# Patient Record
Sex: Female | Born: 1950 | Race: White | Hispanic: No | Marital: Single | Smoking: Never smoker
Health system: Southern US, Community
[De-identification: ages and names within clinical notes are randomized; demographics above are authoritative.]

## PROBLEM LIST (undated history)

## (undated) DIAGNOSIS — M1710 Unilateral primary osteoarthritis, unspecified knee: Secondary | ICD-10-CM

## (undated) DIAGNOSIS — I10 Essential (primary) hypertension: Secondary | ICD-10-CM

## (undated) DIAGNOSIS — R519 Headache, unspecified: Secondary | ICD-10-CM

## (undated) DIAGNOSIS — E039 Hypothyroidism, unspecified: Secondary | ICD-10-CM

## (undated) DIAGNOSIS — C801 Malignant (primary) neoplasm, unspecified: Secondary | ICD-10-CM

## (undated) DIAGNOSIS — R51 Headache: Secondary | ICD-10-CM

## (undated) HISTORY — DX: Malignant (primary) neoplasm, unspecified: C80.1

---

## 2004-10-02 ENCOUNTER — Ambulatory Visit: Payer: Self-pay | Admitting: Family Medicine

## 2004-10-16 ENCOUNTER — Ambulatory Visit: Payer: Self-pay | Admitting: Family Medicine

## 2004-12-23 ENCOUNTER — Ambulatory Visit: Payer: Self-pay | Admitting: Family Medicine

## 2005-01-13 ENCOUNTER — Ambulatory Visit: Payer: Self-pay | Admitting: Internal Medicine

## 2005-01-13 ENCOUNTER — Ambulatory Visit (HOSPITAL_COMMUNITY): Admission: RE | Admit: 2005-01-13 | Discharge: 2005-01-13 | Payer: Self-pay | Admitting: Internal Medicine

## 2005-02-24 ENCOUNTER — Ambulatory Visit: Payer: Self-pay | Admitting: Family Medicine

## 2005-04-23 ENCOUNTER — Ambulatory Visit: Payer: Self-pay | Admitting: Family Medicine

## 2005-07-29 ENCOUNTER — Ambulatory Visit: Payer: Self-pay | Admitting: Family Medicine

## 2005-09-14 ENCOUNTER — Ambulatory Visit: Payer: Self-pay | Admitting: Family Medicine

## 2006-05-03 ENCOUNTER — Ambulatory Visit: Payer: Self-pay | Admitting: Family Medicine

## 2006-09-15 ENCOUNTER — Ambulatory Visit: Payer: Self-pay | Admitting: Family Medicine

## 2007-03-10 ENCOUNTER — Inpatient Hospital Stay (HOSPITAL_COMMUNITY): Admission: RE | Admit: 2007-03-10 | Discharge: 2007-03-12 | Payer: Self-pay | Admitting: Orthopedic Surgery

## 2007-03-10 IMAGING — CR DG KNEE 1-2V PORT*L*
2 series · 2 of 2 positions shown · non-contrast
Comparison: None

CLINICAL DATA: Arthroplasty

LEFT KNEE - 2 VIEW

[view not recorded (1 of 2)]
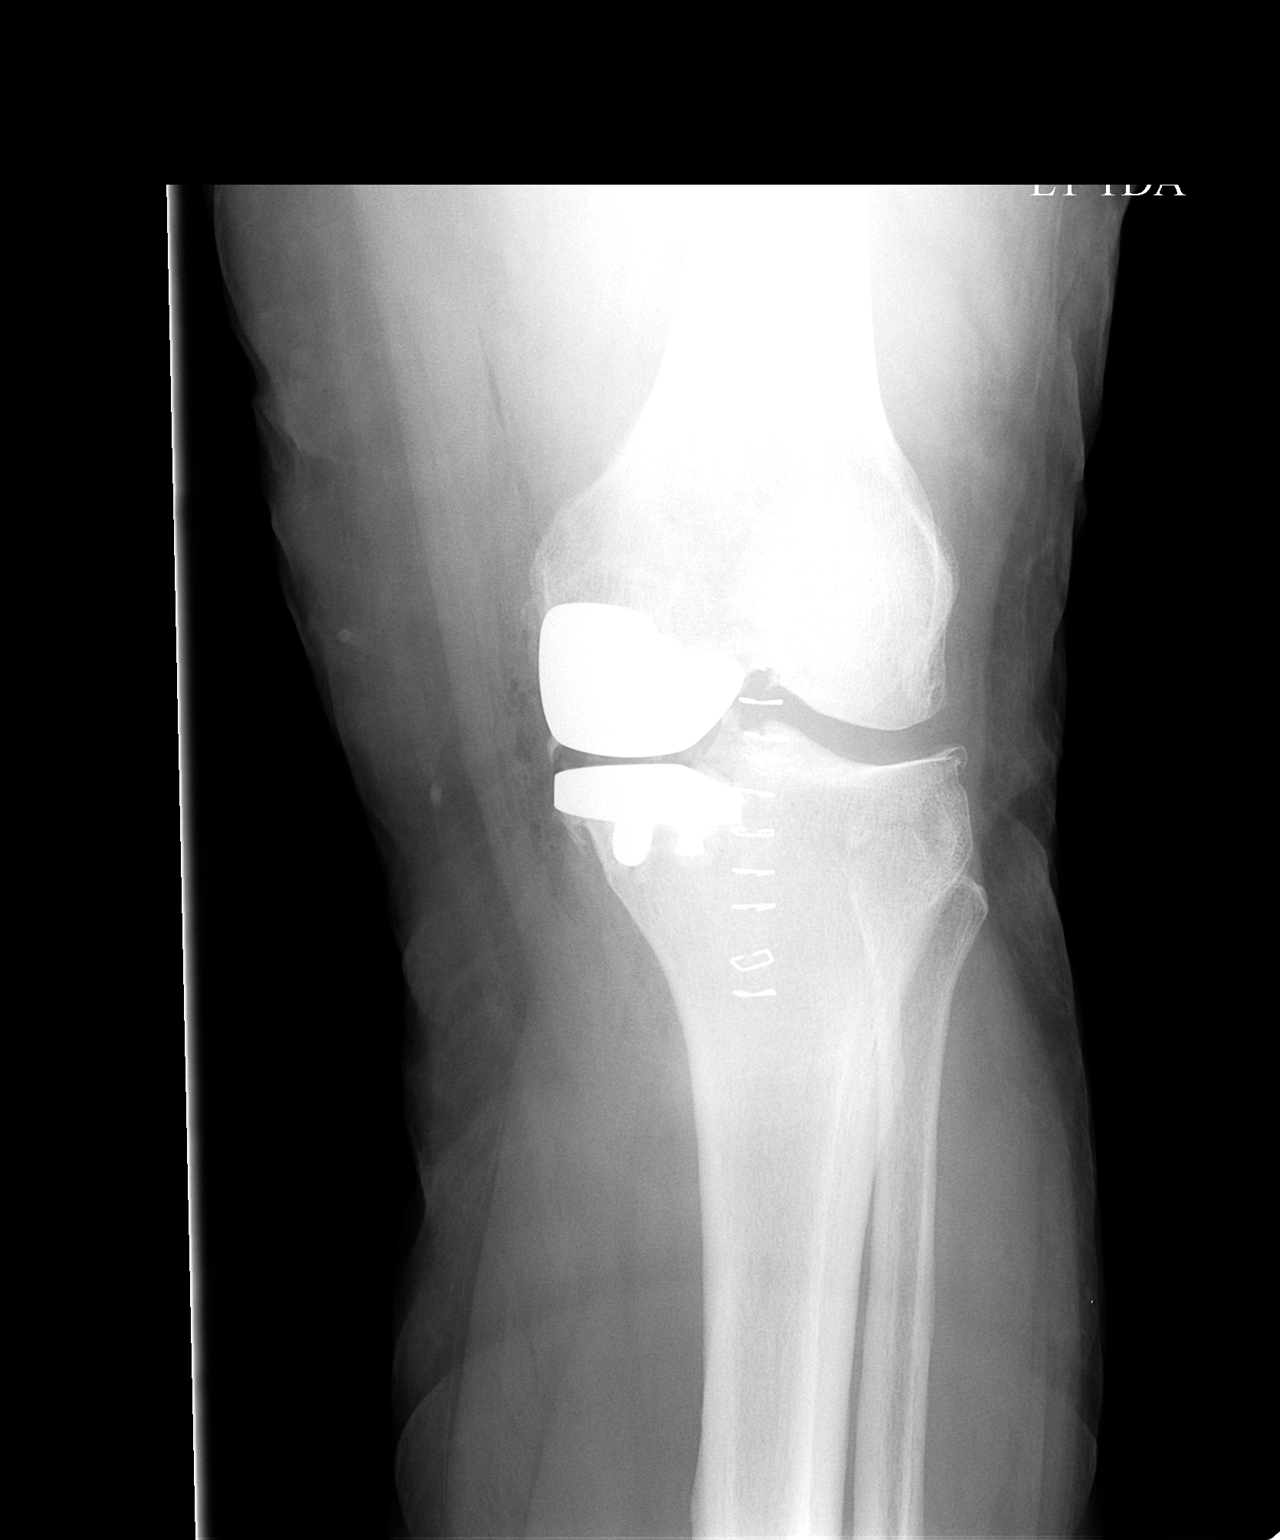

[view not recorded (2 of 2)]
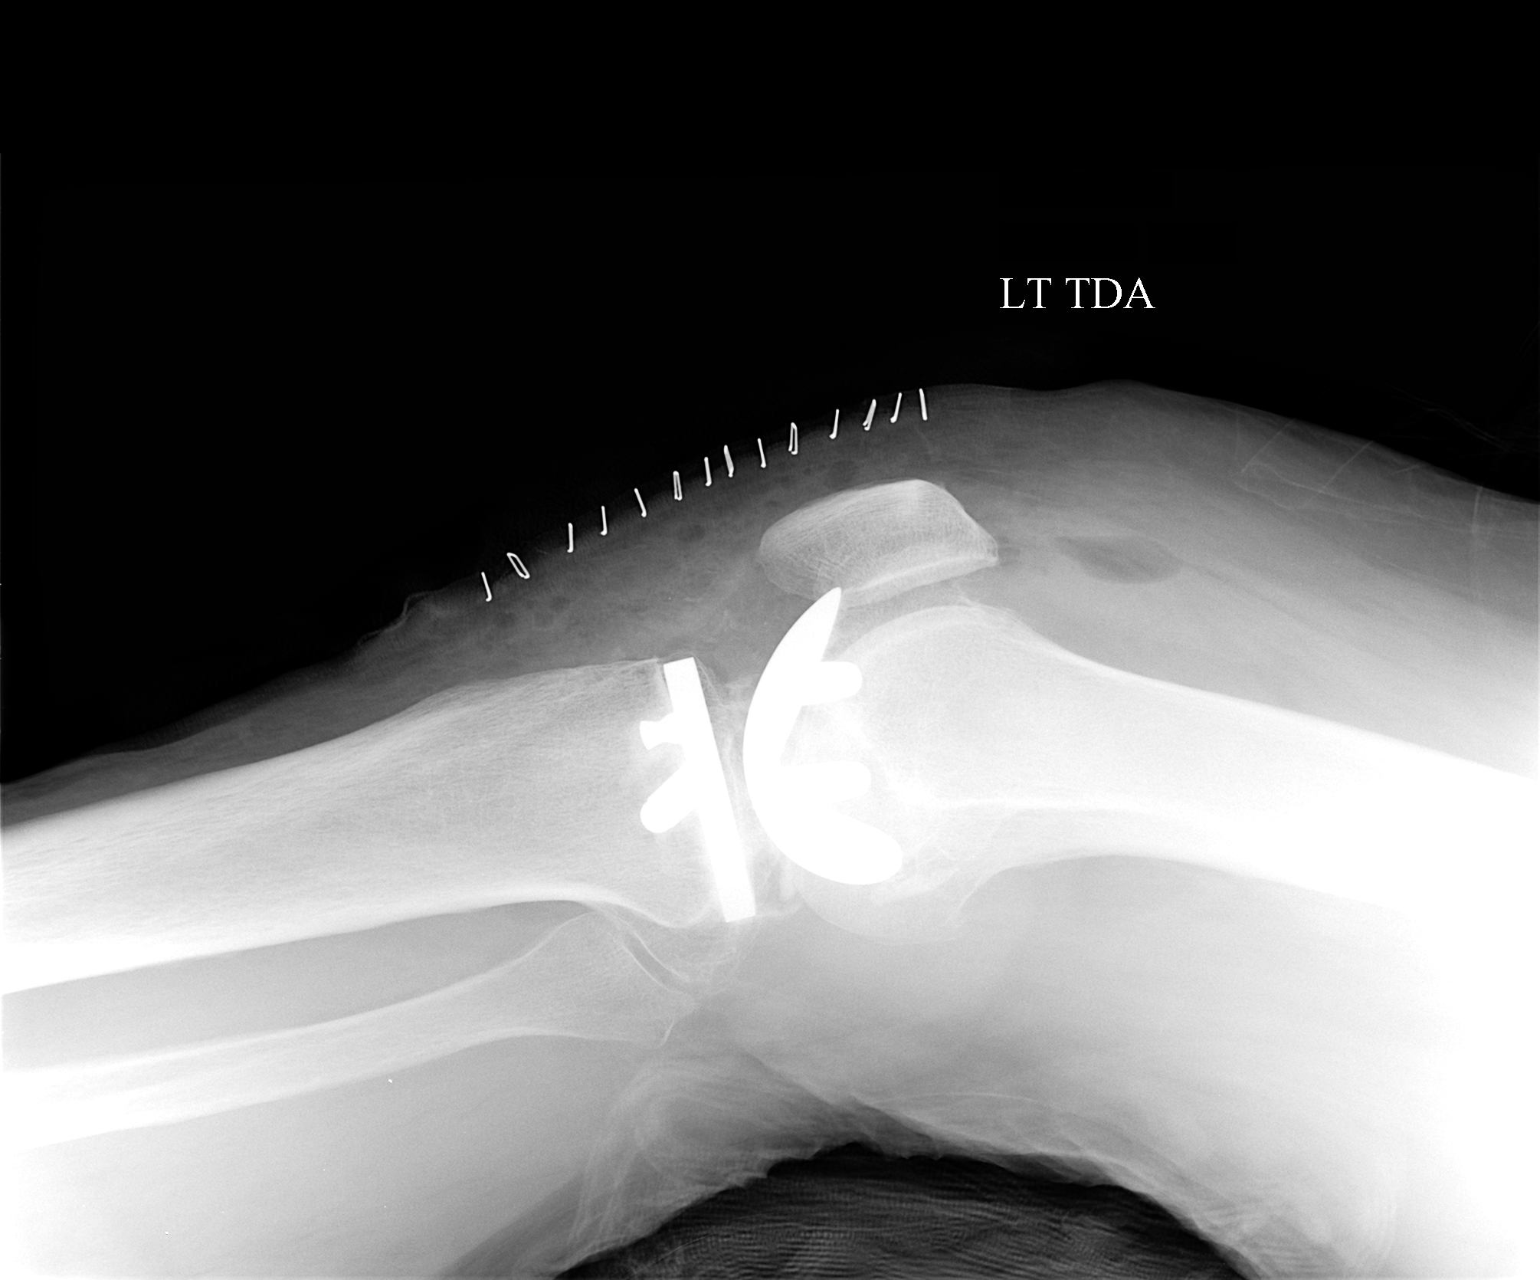

[2 of 2 positions shown; findings below may reference images not displayed]

FINDINGS: Left medial hemiarthroplasty. No hardware complication. Degenerative
changes of the lateral compartment. Expected postoperative air within the joint.

IMPRESSION

Expected appearance after hemiarthroplasty.

## 2007-03-29 ENCOUNTER — Ambulatory Visit
Admission: RE | Admit: 2007-03-29 | Discharge: 2007-03-30 | Payer: BLUE CROSS/BLUE SHIELD | Attending: Orthopedic Surgery | Admitting: Orthopedic Surgery

## 2007-08-13 ENCOUNTER — Emergency Department (HOSPITAL_COMMUNITY): Admission: EM | Admit: 2007-08-13 | Discharge: 2007-08-13 | Payer: Self-pay | Admitting: Emergency Medicine

## 2009-06-29 HISTORY — PX: KNEE ARTHROPLASTY: SHX992

## 2010-11-11 NOTE — Op Note (Signed)
NAME:  Laurie Mckenzie, Laurie Mckenzie NO.:  [id number]   MEDICAL RECORD NO.:  [id number]          PATIENT TYPE:  INP   LOCATION:  5016                         FACILITY:  [facility]   PHYSICIAN:  Loreta Ave, M.D. DATE OF BIRTH:  16-Jan-1951   DATE OF PROCEDURE:  03/10/2007  DATE OF DISCHARGE:                               OPERATIVE REPORT   PREOPERATIVE DIAGNOSIS:  End stage degenerative arthritis, medial  compartment, left knee.   POSTOPERATIVE DIAGNOSIS:  End stage degenerative arthritis, medial  compartment, left knee.   OPERATIVE PROCEDURE:  Left knee unicompartmental partial knee  replacement utilizing Stryker prosthesis TKR.  Cemented pegged #4  femoral component.  Cemented #3 metallic tibial component with a 9 mm  polyethylene insert.  Soft tissue balancing and medial capsular release.  Excision medial meniscus.   SURGEON:  Loreta Ave, M.D.   ASSISTANT:  Genene Churn. Barry Dienes, P.A.-C., present throughout the entire [place].   ANESTHESIA:  General.   BLOOD LOSS:  Minimal.   TOURNIQUET TIME:  1 hour 45 minutes.   SPECIMENS:  None.   CULTURES:  None.   COMPLICATIONS:  None.   DRESSING:  Soft compressive and knee immobilizer.   DESCRIPTION OF PROCEDURE:  The patient was brought to the operating room  and placed on the operating table in supine position.  After adequate  anesthesia had been obtained, the left knee examined.  Varus alignment  correctable to neutral.  Not much in the way of flexion contracture.  Further flexion to 125 degrees.  Tourniquet applied.  Prepped and draped  in the usual sterile fashion.  Exsanguinated with elevation of Esmarch  and tourniquet inflated to 350 mmHg.  Medial parapatellar incision from  adjacent to the patella down to tibial tubercle.  The skin and  subcutaneous tissue divided.  Medial arthrotomy.  Knee exposed.  Synovitis remnants of menisci periarticular spurs removed.  Patellofemoral joint, lateral compartment, lateral  meniscus, cruciate  ligaments all intact.  Attention turned medially.  Extramedullary guide.  Appropriate sagittal and transverse resection of the medial tibia with  sufficient resection for 9 mm insert.  Extramedullary guide was  utilized.  I then assessed the flexion and extension gap and chose the  appropriate guides for both the distal femoral resection and then the  posterior and chamfer resections.  When those were complete, the femur  was sized for a #4 component and drilled for the pegs.  All recess  examined.  Care was taken to be sure all spurs were removed throughout.  Also, was careful to make sure I had all of the medial meniscus which  was torn removed.  Sized the tibia for a #3 component.  Trials put in  place.  #4 on the femur, #3 on the tibia and a 9 mm insert.  After the  tibial cut was freshened and I made sure this was nice and flat, this  yielded a nicely balanced knee with full extension, full flexion, good  alignment, and good stability.  The tibia was pre-reamed for the pegs  for the tibial component.  All  trials removed.  Copious irrigation with  pulse irrigating device.  Cement prepared and placed on both components  which were firmly seated and then polyethylene attached to the tibial  component.  Knee reduced.  Once the cement hardened, the knee was re-  examined.  Full extension, full flexion, nicely balanced knee, normal  mechanical axis, good stability.  Wound irrigated.  Arthrotomy closed  with Vicryl, skin and subcutaneous tissue Vicryl and staples.  Knee  injected with Marcaine.  Sterile compressive dressing applied.  Tourniquet was removed.  Knee immobilizer applied.  Anesthesia reversed.  Brought to the recovery room. Tolerated the surgery well without  complications.      Loreta Ave, M.D.  Electronically Signed     DFM/MEDQ  D:  03/10/2007  T:  03/10/2007  Job:  [id number]

## 2011-04-10 LAB — CBC
HCT: 39.8
Hemoglobin: 13.7
MCHC: 34.1
MCHC: 34.4
MCHC: 34.5
MCV: 91.3
MCV: 91.3
Platelets: 221
RBC: 3.76 — ABNORMAL LOW
RBC: 4
RBC: 4.36
RDW: 12.9
WBC: 8

## 2011-04-10 LAB — URINALYSIS, ROUTINE W REFLEX MICROSCOPIC
Bilirubin Urine: NEGATIVE
Glucose, UA: NEGATIVE
Ketones, ur: NEGATIVE
Nitrite: NEGATIVE
Protein, ur: NEGATIVE
Specific Gravity, Urine: 1.009
Urobilinogen, UA: 0.2
pH: 5.5
pH: 6

## 2011-04-10 LAB — TYPE AND SCREEN: Antibody Screen: NEGATIVE

## 2011-04-10 LAB — COMPREHENSIVE METABOLIC PANEL
ALT: 30
AST: 25
CO2: 25
Chloride: 105
Creatinine, Ser: 0.84
GFR calc Af Amer: >60
GFR calc non Af Amer: >60
Glucose, Bld: 104 — ABNORMAL HIGH
Total Bilirubin: 0.8

## 2011-04-10 LAB — BASIC METABOLIC PANEL
BUN: 5 — ABNORMAL LOW
CO2: 29
CO2: 31
Calcium: 8.8
Chloride: 100
Creatinine, Ser: 0.84
Creatinine, Ser: 0.85
GFR calc Af Amer: >60
Glucose, Bld: 132 — ABNORMAL HIGH

## 2011-04-10 LAB — APTT: aPTT: 32

## 2011-04-10 LAB — URINE CULTURE: Special Requests: NEGATIVE

## 2011-04-10 LAB — URINE MICROSCOPIC-ADD ON

## 2011-04-10 LAB — PROTIME-INR: Prothrombin Time: 12.5

## 2013-12-25 DIAGNOSIS — E785 Hyperlipidemia, unspecified: Secondary | ICD-10-CM | POA: Insufficient documentation

## 2014-02-20 DIAGNOSIS — E669 Obesity, unspecified: Secondary | ICD-10-CM | POA: Insufficient documentation

## 2014-12-11 ENCOUNTER — Telehealth: Payer: Self-pay | Admitting: Internal Medicine

## 2014-12-11 NOTE — Telephone Encounter (Signed)
Patient is on the July recall list for TCS 10 yr

## 2014-12-12 NOTE — Telephone Encounter (Signed)
Letter mailed for them to call and get scheduled

## 2014-12-19 ENCOUNTER — Other Ambulatory Visit: Payer: Self-pay

## 2014-12-19 ENCOUNTER — Telehealth: Payer: Self-pay

## 2014-12-19 ENCOUNTER — Telehealth: Payer: Self-pay | Admitting: *Deleted

## 2014-12-19 DIAGNOSIS — Z1211 Encounter for screening for malignant neoplasm of colon: Secondary | ICD-10-CM

## 2014-12-19 NOTE — Telephone Encounter (Signed)
I called pt and got some of the triage info. She will call back tomorrow with the medication list.

## 2014-12-19 NOTE — Telephone Encounter (Signed)
Pt called regarding the letter she received to schedule her colonoscopy. Please advise pt at work after 12:00. [phone]

## 2014-12-20 MED ORDER — PEG-KCL-NACL-NASULF-NA ASC-C 100 G PO SOLR: 1.0000 | ORAL | Status: DC

## 2014-12-20 NOTE — Telephone Encounter (Signed)
Ok to schedule. No need for antibiotics for colonoscopy based on current guidelines.

## 2014-12-20 NOTE — Telephone Encounter (Signed)
LM at work for a return call to explain about the antibiotics. Rx sent to the pharmacy and instructions mailed to pt.

## 2014-12-20 NOTE — Telephone Encounter (Signed)
Gastroenterology Pre-Procedure Review  Request Date: 12/19/2014 Requesting Physician: ON 10 YEAR RECALL  PATIENT REVIEW QUESTIONS: The patient responded to the following health history questions as indicated:    1. Diabetes Melitis: no 2. Joint replacements in the past 12 months: no 3. Major health problems in the past 3 months: no 4. Has an artificial valve or MVP: no 5. Has a defibrillator: no 6. Has been advised in past to take antibiotics in advance of a procedure like teeth cleaning: PT HAD KNEE REPLACEMENT IN 2008 AND WAS ADVISED TO TAKE ANTIBIOTICS FOR DENTAL PROCEDURES    MEDICATIONS & ALLERGIES:    Patient reports the following regarding taking any blood thinners:   Plavix? no Aspirin? no Coumadin? no  Patient confirms/reports the following medications:  Current Outpatient Prescriptions  Medication Sig Dispense Refill  . Calcium Carbonate-Vit D-Min (CALCIUM 1200 PO) Take by mouth daily.    . Choline Fenofibrate (TRILIPIX) 135 MG capsule Take 135 mg by mouth daily.    Marland Kitchen levothyroxine (SYNTHROID, LEVOTHROID) 100 MCG tablet Take 100 mcg by mouth daily before breakfast.    . montelukast (SINGULAIR) 10 MG tablet Take 10 mg by mouth at bedtime.    . Multiple Vitamin (MULTIVITAMIN) tablet Take 1 tablet by mouth daily.     No current facility-administered medications for this visit.    Patient confirms/reports the following allergies:  Allergies  Allergen Reactions  . Fish Allergy   . Ivp Dye [Iodinated Diagnostic Agents]   . Other     BEES    No orders of the defined types were placed in this encounter.    AUTHORIZATION INFORMATION Primary Insurance:   ID #:   Group #:  Pre-Cert / Auth required:  Pre-Cert / Auth #:   Secondary Insurance:  ID #:   Group #:  Pre-Cert / Auth required:  Pre-Cert / Auth #:   SCHEDULE INFORMATION: Procedure has been scheduled as follows:  Date: 01/16/2015                  Time:  8:30 am Location: [facility] Short  Stay  This Gastroenterology Pre-Precedure Review Form is being routed to the following provider(s): R. Garfield Cornea, MD

## 2014-12-20 NOTE — Telephone Encounter (Signed)
See separate triage.  

## 2014-12-20 NOTE — Telephone Encounter (Signed)
Pt is aware.  

## 2015-01-08 ENCOUNTER — Telehealth: Payer: Self-pay

## 2015-01-08 NOTE — Telephone Encounter (Signed)
I called [employer] @ [phone] and spoke to [place] who said a PA is not required for a screening colonoscopy.

## 2015-01-16 ENCOUNTER — Encounter (HOSPITAL_COMMUNITY)
Admission: RE | Disposition: A | Discharge status: Home / Self Care | Payer: Self-pay | Source: Ambulatory Visit | Attending: Internal Medicine

## 2015-01-16 ENCOUNTER — Ambulatory Visit (HOSPITAL_COMMUNITY)
Admission: RE | Admit: 2015-01-16 | Discharge: 2015-01-16 | Disposition: A | Discharge status: Home / Self Care | Payer: BLUE CROSS/BLUE SHIELD | Source: Ambulatory Visit | Attending: Internal Medicine | Admitting: Internal Medicine

## 2015-01-16 ENCOUNTER — Encounter (HOSPITAL_COMMUNITY): Payer: Self-pay

## 2015-01-16 DIAGNOSIS — Z1211 Encounter for screening for malignant neoplasm of colon: Secondary | ICD-10-CM | POA: Insufficient documentation

## 2015-01-16 DIAGNOSIS — Z96652 Presence of left artificial knee joint: Secondary | ICD-10-CM | POA: Insufficient documentation

## 2015-01-16 DIAGNOSIS — Z79899 Other long term (current) drug therapy: Secondary | ICD-10-CM | POA: Diagnosis not present

## 2015-01-16 HISTORY — DX: Headache, unspecified: R51.9

## 2015-01-16 HISTORY — DX: Headache: R51

## 2015-01-16 HISTORY — PX: COLONOSCOPY: SHX5424

## 2015-01-16 SURGERY — COLONOSCOPY
Anesthesia: Moderate Sedation

## 2015-01-16 MED ORDER — ONDANSETRON HCL 4 MG/2ML IJ SOLN
INTRAMUSCULAR | Status: DC | PRN
  Administered 2015-01-16: 4 mg via INTRAVENOUS

## 2015-01-16 MED ORDER — MIDAZOLAM HCL 5 MG/5ML IJ SOLN
INTRAMUSCULAR | Status: AC
  Filled 2015-01-16: qty 10

## 2015-01-16 MED ORDER — MEPERIDINE HCL 100 MG/ML IJ SOLN
INTRAMUSCULAR | Status: AC
  Filled 2015-01-16: qty 2

## 2015-01-16 MED ORDER — STERILE WATER FOR IRRIGATION IR SOLN
Status: DC | PRN
  Administered 2015-01-16: 08:00:00

## 2015-01-16 MED ORDER — MEPERIDINE HCL 100 MG/ML IJ SOLN
INTRAMUSCULAR | Status: DC | PRN
  Administered 2015-01-16: 25 mg via INTRAVENOUS
  Administered 2015-01-16: 50 mg via INTRAVENOUS

## 2015-01-16 MED ORDER — MIDAZOLAM HCL 5 MG/5ML IJ SOLN
INTRAMUSCULAR | Status: DC | PRN
  Administered 2015-01-16: 2 mg via INTRAVENOUS
  Administered 2015-01-16 (×2): 1 mg via INTRAVENOUS

## 2015-01-16 MED ORDER — ONDANSETRON HCL 4 MG/2ML IJ SOLN
INTRAMUSCULAR | Status: AC
  Filled 2015-01-16: qty 2

## 2015-01-16 MED ORDER — SODIUM CHLORIDE 0.9 % IV SOLN
INTRAVENOUS | Status: DC
  Administered 2015-01-16: 08:00:00 via INTRAVENOUS

## 2015-01-16 NOTE — H&P (Signed)
@LOGO@   Primary Care Physician:  NYLAND,LEONARD ROBERT, MD Primary Gastroenterologist:  Dr. Rourk  Pre-Procedure History & Physical: HPI:  Laurie Mckenzie is a 63 y.o. female is here for a screening colonoscopy. Negative colonoscopy 2006. No bowel symptoms. No family history of colon cancer.  Past Medical History  Diagnosis Date  . Headache     Past Surgical History  Procedure Laterality Date  . Knee arthroplasty Left 2011    [place]    Prior to Admission medications   Medication Sig Start Date End Date Taking? Authorizing Provider  acetaminophen (TYLENOL) 500 MG tablet Take 1,000 mg by mouth every 6 (six) hours as needed.   Yes Historical Provider, MD  Calcium Carbonate-Vit D-Min (CALCIUM 1200 PO) Take by mouth daily.   Yes Historical Provider, MD  Choline Fenofibrate (TRILIPIX) 135 MG capsule Take 135 mg by mouth daily.   Yes Historical Provider, MD  levothyroxine (SYNTHROID, LEVOTHROID) 100 MCG tablet Take 100 mcg by mouth daily before breakfast.   Yes Historical Provider, MD  montelukast (SINGULAIR) 10 MG tablet Take 10 mg by mouth at bedtime.   Yes Historical Provider, MD  Multiple Vitamin (MULTIVITAMIN) tablet Take 1 tablet by mouth daily.   Yes Historical Provider, MD  peg 3350 powder (MOVIPREP) 100 G SOLR Take 1 kit (200 g total) by mouth as directed. 12/20/14  Yes Robert M Rourk, MD    Allergies as of 12/19/2014 - Review Complete 12/19/2014  Allergen Reaction Noted  . Beesix [pyridoxine]  12/19/2014  . Fish allergy  12/19/2014  . Ivp dye [iodinated diagnostic agents]  12/19/2014    History reviewed. No pertinent family history.  History   Social History  . Marital Status: Single    Spouse Name: N/A  . Number of Children: N/A  . Years of Education: N/A   Occupational History  . Not on file.   Social History Main Topics  . Smoking status: Never Smoker   . Smokeless tobacco: Not on file  . Alcohol Use: No  . Drug Use: No  . Sexual Activity: Not on  file   Other Topics Concern  . Not on file   Social History Narrative  . No narrative on file    Review of Systems: See HPI, otherwise negative ROS  Physical Exam: BP 147/70 mmHg  Pulse 77  Temp(Src) 98.7 F (37.1 C) (Oral)  Resp 16  Ht 5' 5" (1.651 m)  Wt 195 lb (88.451 kg)  BMI 32.45 kg/m2  SpO2 97% General:   Alert,  Well-developed, well-nourished, pleasant and cooperative in NAD Head:  Normocephalic and atraumatic. Eyes:  Sclera clear, no icterus.   Conjunctiva pink. Ears:  Normal auditory acuity. Nose:  No deformity, discharge,  or lesions. Mouth:  No deformity or lesions, dentition normal. Neck:  Supple; no masses or thyromegaly. Lungs:  Clear throughout to auscultation.   No wheezes, crackles, or rhonchi. No acute distress. Heart:  Regular rate and rhythm; no murmurs, clicks, rubs,  or gallops. Abdomen:  Soft, nontender and nondistended. No masses, hepatosplenomegaly or hernias noted. Normal bowel sounds, without guarding, and without rebound.   Msk:  Symmetrical without gross deformities. Normal posture. Pulses:  Normal pulses noted. Extremities:  Without clubbing or edema.   Impression:  Laurie Mckenzie is now here to undergo a screening colonoscopy. Average risk screening examination.  Risks, benefits, limitations, imponderables and alternatives regarding colonoscopy have been reviewed with the patient. Questions have been answered. All parties agreeable.       Notice:  This dictation was prepared with Dragon dictation along with smaller phrase technology. Any transcriptional errors that result from this process are unintentional and may not be corrected upon review.

## 2015-01-16 NOTE — Discharge Instructions (Signed)
Colonoscopy, Care After °These instructions give you information on caring for yourself after your procedure. Your doctor may also give you more specific instructions. Call your doctor if you have any problems or questions after your procedure. °HOME CARE °· Do not drive for 24 hours. °· Do not sign important papers or use machinery for 24 hours. °· You may shower. °· You may go back to your usual activities, but go slower for the first 24 hours. °· Take rest breaks often during the first 24 hours. °· Walk around or use warm packs on your belly (abdomen) if you have belly cramping or gas. °· Drink enough fluids to keep your pee (urine) clear or pale yellow. °· Resume your normal diet. Avoid heavy or fried foods. °· Avoid drinking alcohol for 24 hours or as told by your doctor. °· Only take medicines as told by your doctor. °If a tissue sample (biopsy) was taken during the procedure:  °· Do not take aspirin or blood thinners for 7 days, or as told by your doctor. °· Do not drink alcohol for 7 days, or as told by your doctor. °· Eat soft foods for the first 24 hours. °GET HELP IF: °You still have a small amount of blood in your poop (stool) 2-3 days after the procedure. °GET HELP RIGHT AWAY IF: °· You have more than a small amount of blood in your poop. °· You see clumps of tissue (blood clots) in your poop. °· Your belly is puffy (swollen). °· You feel sick to your stomach (nauseous) or throw up (vomit). °· You have a fever. °· You have belly pain that gets worse and medicine does not help. °MAKE SURE YOU: °· Understand these instructions. °· Will watch your condition. °· Will get help right away if you are not doing well or get worse. °Document Released: 07/18/2010 Document Revised: 06/20/2013 Document Reviewed: 02/20/2013 °ExitCare® Patient Information ©2015 [employer]. This information is not intended to replace advice given to you by your health care provider. Make sure you discuss any questions you have with  your health care provider. ° °

## 2015-01-16 NOTE — Op Note (Signed)
[facility] [address] [place], [address]   COLONOSCOPY PROCEDURE REPORT  PATIENT: [name], Laurie Mckenzie  MR#: [id number] BIRTHDATE: Jul 20, 1950 , 63  yrs. old GENDER: female ENDOSCOPIST: R.  Garfield Cornea, MD FACP [facility] REFERRED EB:RAXENMM Edrick Oh, M.D. PROCEDURE DATE:  01/20/15 PROCEDURE:   Colonoscopy, screening INDICATIONS:Average risk colorectal cancer screening examination. MEDICATIONS: Versed 4 mg IV and Demerol 75 mg IV in divided doses. Zofran 4 mg IV. ASA CLASS:       Class II  CONSENT: The risks, benefits, alternatives and imponderables including but not limited to bleeding, perforation as well as the possibility of a missed lesion have been reviewed.  The potential for biopsy, lesion removal, etc. have also been discussed. Questions have been answered.  All parties agreeable.  Please see the history and physical in the medical record for more information.  DESCRIPTION OF PROCEDURE:   After the risks benefits and alternatives of the procedure were thoroughly explained, informed consent was obtained.  The digital rectal exam      The EC-3890Li ([id number])  endoscope was introduced through the anus and advanced to the cecum, which was identified by both the appendix and ileocecal valve. No adverse events experienced.   The quality of the prep was adequate  The instrument was then slowly withdrawn as the colon was fully examined. Estimated blood loss is zero unless otherwise noted in this procedure report.      COLON FINDINGS: Normal-appearing rectal mucosa.  Normal-appearing colonic mucosa.  Retroflexion was performed. .  Withdrawal time=10 minutes 0 seconds.  The scope was withdrawn and the procedure completed. COMPLICATIONS: There were no immediate complications.  ENDOSCOPIC IMPRESSION: Normal colonoscopy  RECOMMENDATIONS: Repeat colonoscopy for screening purposes in 10 years  eSigned:  R. Garfield Cornea, MD Rosalita Chessman [facility] 01/20/15 8:53  AM   cc:  CPT CODES: ICD CODES:  The ICD and CPT codes recommended by this software are interpretations from the data that the clinical staff has captured with the software.  The verification of the translation of this report to the ICD and CPT codes and modifiers is the sole responsibility of the health care institution and practicing physician where this report was generated.  [employer] will not be held responsible for the validity of the ICD and CPT codes included on this report.  AMA assumes no liability for data contained or not contained herein. CPT is a Designer, television/film set of the [employer].

## 2015-01-17 ENCOUNTER — Encounter (HOSPITAL_COMMUNITY): Payer: Self-pay | Admitting: Internal Medicine

## 2015-03-18 DIAGNOSIS — E785 Hyperlipidemia, unspecified: Secondary | ICD-10-CM | POA: Insufficient documentation

## 2015-03-18 DIAGNOSIS — J309 Allergic rhinitis, unspecified: Secondary | ICD-10-CM | POA: Insufficient documentation

## 2015-07-09 DIAGNOSIS — J0100 Acute maxillary sinusitis, unspecified: Secondary | ICD-10-CM | POA: Insufficient documentation

## 2017-08-11 ENCOUNTER — Encounter (HOSPITAL_COMMUNITY): Payer: Self-pay | Admitting: Physician Assistant

## 2017-08-11 DIAGNOSIS — E039 Hypothyroidism, unspecified: Secondary | ICD-10-CM | POA: Diagnosis present

## 2017-08-11 DIAGNOSIS — I10 Essential (primary) hypertension: Secondary | ICD-10-CM | POA: Diagnosis present

## 2017-08-11 NOTE — H&P (Signed)
TOTAL KNEE ADMISSION H&P  Patient is being admitted for right total knee arthroplasty.  Subjective:  Chief Complaint:right knee pain.  HPI: Laurie Mckenzie, 67 y.o. female, has a history of pain and functional disability in the right knee due to arthritis and has failed non-surgical conservative treatments for greater than 12 weeks to includeNSAID's and/or analgesics, corticosteriod injections, viscosupplementation injections, flexibility and strengthening excercises, supervised PT with diminished ADL's post treatment and activity modification.  Onset of symptoms was gradual, starting 10 years ago with gradually worsening course since that time. The patient noted no past surgery on the right knee(s).  Patient currently rates pain in the right knee(s) at 10 out of 10 with activity. Patient has night pain, worsening of pain with activity and weight bearing, pain that interferes with activities of daily living, crepitus and joint swelling.  Patient has evidence of subchondral sclerosis, periarticular osteophytes and joint space narrowing by imaging studies.There is no active infection.  Patient Active Problem List   Diagnosis Date Noted  . Hypothyroidism   . Hypertension   . Special screening for malignant neoplasms, colon    Past Medical History:  Diagnosis Date  . Headache   . Hypertension    186/106 today    195/108 07/27/2017  . Hypothyroidism     Past Surgical History:  Procedure Laterality Date  . COLONOSCOPY N/A 01/16/2015   Procedure: COLONOSCOPY;  Surgeon: Daneil Dolin, MD;  Location: [facility];  Service: Endoscopy;  Laterality: N/A;  8:30 AM  . KNEE ARTHROPLASTY Left 2011   [place]    No current facility-administered medications for this encounter.    Current Outpatient Medications  Medication Sig Dispense Refill Last Dose  . acetaminophen (TYLENOL) 500 MG tablet Take 1,000-1,500 mg by mouth every 6 (six) hours as needed for moderate pain or headache.    Past Week at  Unknown time  . Calcium Carbonate-Vit D-Min (CALCIUM 1200 PO) Take 2 tablets by mouth daily.    08/11/2017 at Unknown time  . Cholecalciferol (VITAMIN D3 PO) Take 1 capsule by mouth daily.   08/11/2017 at Unknown time  . levothyroxine (SYNTHROID, LEVOTHROID) 137 MCG tablet Take 137 mcg by mouth daily before breakfast.    08/10/2017 at Unknown time  . montelukast (SINGULAIR) 10 MG tablet Take 10 mg by mouth at bedtime.   08/11/2017 at Unknown time  . Multiple Vitamin (MULTIVITAMIN) tablet Take 1 tablet by mouth daily.   08/11/2017 at Unknown time  . EPINEPHrine 0.3 mg/0.3 mL IJ SOAJ injection Inject 0.3 mg into the muscle once.   Unknown at Unknown time   Allergies  Allergen Reactions  . Bee Venom Swelling and Other (See Comments)    Red and hot to site  . Fish Allergy Hives  . Ivp Dye [Iodinated Diagnostic Agents] Other (See Comments)    MD advised not to use, due to fish allergies    Social History   Tobacco Use  . Smoking status: Never Smoker  . Smokeless tobacco: Never Used  Substance Use Topics  . Alcohol use: No    Family History  Problem Relation Age of Onset  . Hypertension Mother   . Lung disease Father      Review of Systems  Constitutional: Negative.   HENT: Negative.   Eyes: Negative.   Respiratory: Negative.   Cardiovascular: Negative.   Gastrointestinal: Negative.   Genitourinary: Negative.   Musculoskeletal: Positive for back pain and joint pain.  Skin: Negative.   Neurological: Negative.  Endo/Heme/Allergies: Negative.   Psychiatric/Behavioral: Negative.     Objective:  Physical Exam  Constitutional: She is oriented to person, place, and time. She appears well-developed and well-nourished.  HENT:  Head: Normocephalic and atraumatic.  Eyes: Conjunctivae are normal. Pupils are equal, round, and reactive to light.  Neck: Neck supple.  Cardiovascular: Normal rate and regular rhythm.  Respiratory: Effort normal and breath sounds normal.  GI: Soft.   Genitourinary:  Genitourinary Comments: Not pertinent to current symptomatology therefore not examined.  Musculoskeletal:  Examination of her right knee reveals pain medially and laterally.  1+ crepitation.  1+ synovitis.  Range of motion -5 to 110 degrees.  Varus deformity noted.  Knee is stable to ligamentous exam with normal patella tracking.  Examination of her left knee reveals well healed anteromedial incision.  Minimal pain.  Range of motion 0-120 degrees.  Knee is stable with normal patella tracking.    Neurological: She is alert and oriented to person, place, and time.  Skin: Skin is warm and dry.  Psychiatric: She has a normal mood and affect. Her behavior is normal.    Vital signs in last 24 hours: Temp:  [97.9 F (36.6 C)] 97.9 F (36.6 C) (02/13 1600) Pulse Rate:  [86] 86 (02/13 1600) BP: (185)/(106) 185/106 (02/13 1600) SpO2:  [92 %] 92 % (02/13 1600) Weight:  [94.8 kg (209 lb)] 94.8 kg (209 lb) (02/13 1600)  Labs:   Estimated body mass index is 35.87 kg/m as calculated from the following:   Height as of this encounter: 5\' 4"  (1.626 m).   Weight as of this encounter: 94.8 kg (209 lb).   Imaging Review Plain radiographs demonstrate severe degenerative joint disease of the right knee(s). The overall alignment issignificant varus. The bone quality appears to be good for age and reported activity level.  Assessment/Plan:  End stage arthritis, right knee   The patient history, physical examination, clinical judgment of the provider and imaging studies are consistent with end stage degenerative joint disease of the right knee(s) and total knee arthroplasty is deemed medically necessary. The treatment options including medical management, injection therapy arthroscopy and arthroplasty were discussed at length. The risks and benefits of total knee arthroplasty were presented and reviewed. The risks due to aseptic loosening, infection, stiffness, patella tracking problems,  thromboembolic complications and other imponderables were discussed. The patient acknowledged the explanation, agreed to proceed with the plan and consent was signed. Patient is being admitted for inpatient treatment for surgery, pain control, PT, OT, prophylactic antibiotics, VTE prophylaxis, progressive ambulation and ADL's and discharge planning. The patient is planning to be discharged home with home health services

## 2017-08-11 NOTE — Pre-Procedure Instructions (Signed)
Laurie Mckenzie  08/11/2017      [id number] - [place], [place] - [address] [place] [address] Phone: [phone] Fax: [place], [place] [address] Phone: [phone] Fax: [phone]    Your procedure is scheduled on Monday, August 23, 2017  Report to [facility] Admitting Entrance "A" at 7:50AM   Call this number if you have problems the morning of surgery:  [phone]   Remember:  Do not eat food or drink liquids after midnight.  Take these medicines the morning of surgery with A SIP OF WATER: Levothyroxine (SYNTHROID, LEVOTHROID). If needed Acetaminophen (TYLENOL) for pain and EPINEPHrine for an allergic reaction (You are to go the hospital IMMEDIATELY if you had to use this medicine).    Do not wear jewelry, make-up or nail polish.  Do not wear lotions, powders,  perfumes, or deodorant.  Do not shave 48 hours prior to surgery.  Do not bring valuables to the hospital.  [facility] is not responsible for any belongings or valuables.  Contacts, dentures or bridgework may not be worn into surgery.  Leave your suitcase in the car.  After surgery it may be brought to your room.  For patients admitted to the hospital, discharge time will be determined by your treatment team.  Patients discharged the day of surgery will not be allowed to drive home.   Special instructions:   [facility]- Preparing For Surgery  Before surgery, you can play an important role. Because skin is not sterile, your skin needs to be as free of germs as possible. You can reduce the number of germs on your skin by washing with CHG (chlorahexidine gluconate) Soap before surgery.  CHG is an antiseptic cleaner which kills germs and bonds with the skin to continue killing germs even after washing.  Please do not use if you have an allergy to CHG or antibacterial soaps. If your skin  becomes reddened/irritated stop using the CHG.  Do not shave (including legs and underarms) for at least 48 hours prior to first CHG shower. It is OK to shave your face.  Please follow these instructions carefully.   1. Shower the NIGHT BEFORE SURGERY and the MORNING OF SURGERY with CHG.   2. If you chose to wash your hair, wash your hair first as usual with your normal shampoo.  3. After you shampoo, rinse your hair and body thoroughly to remove the shampoo.  4. Use CHG as you would any other liquid soap. You can apply CHG directly to the skin and wash gently with a scrungie or a clean washcloth.   5. Apply the CHG Soap to your body ONLY FROM THE NECK DOWN.  Do not use on open wounds or open sores. Avoid contact with your eyes, ears, mouth and genitals (private parts). Wash Face and genitals (private parts)  with your normal soap.  6. Wash thoroughly, paying special attention to the area where your surgery will be performed.  7. Thoroughly rinse your body with warm water from the neck down.  8. DO NOT shower/wash with your normal soap after using and rinsing off the CHG Soap.  9. Pat yourself dry with a CLEAN TOWEL.  10. Wear CLEAN PAJAMAS to bed the night before surgery, wear comfortable clothes the morning of surgery  11. Place CLEAN SHEETS on your bed the night of your first  shower and DO NOT SLEEP WITH PETS.  Day of Surgery: Do not apply any deodorants/lotions. Please wear clean clothes to the hospital/surgery center.    Please read over the following fact sheets that you were given. Pain Booklet, Coughing and Deep Breathing, Total Joint Packet, MRSA Information and Surgical Site Infection Prevention

## 2017-08-12 ENCOUNTER — Other Ambulatory Visit: Payer: Self-pay

## 2017-08-12 ENCOUNTER — Encounter (HOSPITAL_COMMUNITY)
Admission: RE | Admit: 2017-08-12 | Discharge: 2017-08-12 | Disposition: A | Discharge status: Home / Self Care | Payer: Medicare Other | Source: Ambulatory Visit | Attending: Orthopedic Surgery | Admitting: Orthopedic Surgery

## 2017-08-12 ENCOUNTER — Encounter (HOSPITAL_COMMUNITY): Payer: Self-pay

## 2017-08-12 DIAGNOSIS — M1711 Unilateral primary osteoarthritis, right knee: Secondary | ICD-10-CM | POA: Diagnosis not present

## 2017-08-12 DIAGNOSIS — Z91041 Radiographic dye allergy status: Secondary | ICD-10-CM | POA: Diagnosis not present

## 2017-08-12 DIAGNOSIS — Z79890 Hormone replacement therapy (postmenopausal): Secondary | ICD-10-CM | POA: Diagnosis not present

## 2017-08-12 DIAGNOSIS — Z91030 Bee allergy status: Secondary | ICD-10-CM | POA: Insufficient documentation

## 2017-08-12 DIAGNOSIS — E039 Hypothyroidism, unspecified: Secondary | ICD-10-CM | POA: Insufficient documentation

## 2017-08-12 DIAGNOSIS — R9431 Abnormal electrocardiogram [ECG] [EKG]: Secondary | ICD-10-CM | POA: Insufficient documentation

## 2017-08-12 DIAGNOSIS — Z91013 Allergy to seafood: Secondary | ICD-10-CM | POA: Diagnosis not present

## 2017-08-12 DIAGNOSIS — I10 Essential (primary) hypertension: Secondary | ICD-10-CM | POA: Diagnosis not present

## 2017-08-12 DIAGNOSIS — Z01818 Encounter for other preprocedural examination: Secondary | ICD-10-CM | POA: Insufficient documentation

## 2017-08-12 DIAGNOSIS — Z01812 Encounter for preprocedural laboratory examination: Secondary | ICD-10-CM | POA: Diagnosis not present

## 2017-08-12 DIAGNOSIS — Z8249 Family history of ischemic heart disease and other diseases of the circulatory system: Secondary | ICD-10-CM | POA: Diagnosis not present

## 2017-08-12 DIAGNOSIS — Z79899 Other long term (current) drug therapy: Secondary | ICD-10-CM | POA: Insufficient documentation

## 2017-08-12 DIAGNOSIS — Z836 Family history of other diseases of the respiratory system: Secondary | ICD-10-CM | POA: Diagnosis not present

## 2017-08-12 HISTORY — DX: Unilateral primary osteoarthritis, unspecified knee: M17.10

## 2017-08-12 LAB — CBC WITH DIFFERENTIAL/PLATELET
BASOS ABS: 0.1 10*3/uL (ref 0.0–0.1)
Basophils Relative: 1 %
EOS PCT: 1 %
Eosinophils Absolute: 0.1 10*3/uL (ref 0.0–0.7)
HCT: 46.8 % — ABNORMAL HIGH (ref 36.0–46.0)
Hemoglobin: 15.7 g/dL — ABNORMAL HIGH (ref 12.0–15.0)
LYMPHS ABS: 2.1 10*3/uL (ref 0.7–4.0)
LYMPHS PCT: 23 %
MCH: 31.8 pg (ref 26.0–34.0)
MCHC: 33.5 g/dL (ref 30.0–36.0)
MCV: 94.7 fL (ref 78.0–100.0)
Monocytes Absolute: 0.6 10*3/uL (ref 0.1–1.0)
Monocytes Relative: 6 %
NEUTROS ABS: 6.3 10*3/uL (ref 1.7–7.7)
Neutrophils Relative %: 69 %
PLATELETS: 268 10*3/uL (ref 150–400)
RBC: 4.94 MIL/uL (ref 3.87–5.11)
RDW: 12.4 % (ref 11.5–15.5)
WBC: 9.1 10*3/uL (ref 4.0–10.5)

## 2017-08-12 LAB — COMPREHENSIVE METABOLIC PANEL
ALT: 32 U/L (ref 14–54)
AST: 30 U/L (ref 15–41)
Albumin: 4.4 g/dL (ref 3.5–5.0)
Alkaline Phosphatase: 84 U/L (ref 38–126)
Anion gap: 13 (ref 5–15)
BUN: 11 mg/dL (ref 6–20)
CALCIUM: 9.7 mg/dL (ref 8.9–10.3)
CHLORIDE: 106 mmol/L (ref 101–111)
CO2: 20 mmol/L — ABNORMAL LOW (ref 22–32)
CREATININE: 0.81 mg/dL (ref 0.44–1.00)
GFR calc non Af Amer: >60 mL/min (ref >60)
Glucose, Bld: 118 mg/dL — ABNORMAL HIGH (ref 65–99)
Potassium: 4.1 mmol/L (ref 3.5–5.1)
Sodium: 139 mmol/L (ref 135–145)
TOTAL PROTEIN: 8 g/dL (ref 6.5–8.1)
Total Bilirubin: 0.7 mg/dL (ref 0.3–1.2)

## 2017-08-12 LAB — SURGICAL PCR SCREEN
MRSA, PCR: NEGATIVE
Staphylococcus aureus: NEGATIVE

## 2017-08-12 LAB — PROTIME-INR
INR: 0.98
PROTHROMBIN TIME: 12.9 s (ref 11.4–15.2)

## 2017-08-12 LAB — APTT: aPTT: 33 seconds (ref 24–36)

## 2017-08-12 NOTE — Progress Notes (Signed)
PCP - Dr. Dione Housekeeper  Cardiologist - Denies  Chest x-ray - Denies  EKG - 08/12/17  Stress Test - Denies  ECHO - Denies  Cardiac Cath - Denies  Sleep Study - Denies CPAP - None  LABS- 08/12/17: CBC w/D, CMP, PT, PTT, UC  Anesthesia- nO  Pt denies having chest pain, sob, or fever at this time. All instructions explained to the pt, with a verbal understanding of the material. Pt agrees to go over the instructions while at home for a better understanding. The opportunity to ask questions was provided.

## 2017-08-14 LAB — URINE CULTURE: Culture: >=100000 — AB

## 2017-08-20 MED ORDER — SODIUM CHLORIDE 0.9 % IV SOLN
1000.0000 mg | INTRAVENOUS | Status: AC
  Administered 2017-08-23: 1000 mg via INTRAVENOUS
  Filled 2017-08-20: qty 1100

## 2017-08-21 MED ORDER — BUPIVACAINE LIPOSOME 1.3 % IJ SUSP
20.0000 mL | INTRAMUSCULAR | Status: AC
  Administered 2017-08-23: 20 mL
  Filled 2017-08-21: qty 20

## 2017-08-23 ENCOUNTER — Observation Stay (HOSPITAL_COMMUNITY)
Admission: RE | Admit: 2017-08-23 | Discharge: 2017-08-24 | Disposition: A | Discharge status: Home Health | Payer: Medicare Other | Source: Ambulatory Visit | Attending: Orthopedic Surgery | Admitting: Orthopedic Surgery

## 2017-08-23 ENCOUNTER — Encounter (HOSPITAL_COMMUNITY)
Admission: RE | Disposition: A | Discharge status: Home Health | Payer: Self-pay | Source: Ambulatory Visit | Attending: Orthopedic Surgery

## 2017-08-23 ENCOUNTER — Ambulatory Visit (HOSPITAL_COMMUNITY): Payer: Medicare Other | Admitting: Certified Registered"

## 2017-08-23 ENCOUNTER — Encounter (HOSPITAL_COMMUNITY): Payer: Self-pay | Admitting: *Deleted

## 2017-08-23 DIAGNOSIS — E039 Hypothyroidism, unspecified: Secondary | ICD-10-CM | POA: Insufficient documentation

## 2017-08-23 DIAGNOSIS — Z79899 Other long term (current) drug therapy: Secondary | ICD-10-CM | POA: Diagnosis not present

## 2017-08-23 DIAGNOSIS — R2689 Other abnormalities of gait and mobility: Secondary | ICD-10-CM | POA: Insufficient documentation

## 2017-08-23 DIAGNOSIS — Z7982 Long term (current) use of aspirin: Secondary | ICD-10-CM | POA: Diagnosis not present

## 2017-08-23 DIAGNOSIS — M1711 Unilateral primary osteoarthritis, right knee: Principal | ICD-10-CM | POA: Insufficient documentation

## 2017-08-23 DIAGNOSIS — I10 Essential (primary) hypertension: Secondary | ICD-10-CM | POA: Insufficient documentation

## 2017-08-23 HISTORY — DX: Essential (primary) hypertension: I10

## 2017-08-23 HISTORY — DX: Hypothyroidism, unspecified: E03.9

## 2017-08-23 HISTORY — PX: TOTAL KNEE ARTHROPLASTY: SHX125

## 2017-08-23 LAB — URINALYSIS, ROUTINE W REFLEX MICROSCOPIC
Bilirubin Urine: NEGATIVE
GLUCOSE, UA: NEGATIVE mg/dL
HGB URINE DIPSTICK: NEGATIVE
Ketones, ur: NEGATIVE mg/dL
Leukocytes, UA: NEGATIVE
Nitrite: NEGATIVE
PROTEIN: NEGATIVE mg/dL
Specific Gravity, Urine: 1.013 (ref 1.005–1.030)
pH: 7 (ref 5.0–8.0)

## 2017-08-23 SURGERY — ARTHROPLASTY, KNEE, TOTAL
Anesthesia: Spinal | Laterality: Right

## 2017-08-23 MED ORDER — 0.9 % SODIUM CHLORIDE (POUR BTL) OPTIME
TOPICAL | Status: DC | PRN
  Administered 2017-08-23: 1000 mL

## 2017-08-23 MED ORDER — MIDAZOLAM HCL 5 MG/5ML IJ SOLN
INTRAMUSCULAR | Status: DC | PRN
  Administered 2017-08-23: 2 mg via INTRAVENOUS

## 2017-08-23 MED ORDER — BUPIVACAINE-EPINEPHRINE (PF) 0.5% -1:200000 IJ SOLN
INTRAMUSCULAR | Status: AC
  Filled 2017-08-23: qty 60

## 2017-08-23 MED ORDER — MEPERIDINE HCL 50 MG/ML IJ SOLN: 6.2500 mg | INTRAMUSCULAR | Status: DC | PRN

## 2017-08-23 MED ORDER — POLYETHYLENE GLYCOL 3350 17 G PO PACK
17.0000 g | PACK | Freq: Two times a day (BID) | ORAL | Status: DC
  Administered 2017-08-23 – 2017-08-24 (×2): 17 g via ORAL
  Filled 2017-08-23 (×2): qty 1

## 2017-08-23 MED ORDER — ALUM & MAG HYDROXIDE-SIMETH 200-200-20 MG/5ML PO SUSP: 30.0000 mL | ORAL | Status: DC | PRN

## 2017-08-23 MED ORDER — ACETAMINOPHEN 650 MG RE SUPP: 650.0000 mg | RECTAL | Status: DC | PRN

## 2017-08-23 MED ORDER — METOCLOPRAMIDE HCL 5 MG/ML IJ SOLN: 5.0000 mg | Freq: Three times a day (TID) | INTRAMUSCULAR | Status: DC | PRN

## 2017-08-23 MED ORDER — ACETAMINOPHEN 325 MG PO TABS: 650.0000 mg | ORAL_TABLET | ORAL | Status: DC | PRN

## 2017-08-23 MED ORDER — FENTANYL CITRATE (PF) 100 MCG/2ML IJ SOLN
INTRAMUSCULAR | Status: AC
  Administered 2017-08-23: 50 ug via INTRAVENOUS
  Filled 2017-08-23: qty 2

## 2017-08-23 MED ORDER — ONDANSETRON HCL 4 MG PO TABS: 4.0000 mg | ORAL_TABLET | Freq: Four times a day (QID) | ORAL | Status: DC | PRN

## 2017-08-23 MED ORDER — MIDAZOLAM HCL 2 MG/2ML IJ SOLN: 2.0000 mg | Freq: Once | INTRAMUSCULAR | Status: DC

## 2017-08-23 MED ORDER — MONTELUKAST SODIUM 10 MG PO TABS
10.0000 mg | ORAL_TABLET | Freq: Every day | ORAL | Status: DC
  Administered 2017-08-23: 10 mg via ORAL
  Filled 2017-08-23: qty 1

## 2017-08-23 MED ORDER — OXYCODONE HCL 5 MG PO TABS
10.0000 mg | ORAL_TABLET | ORAL | Status: DC | PRN
  Administered 2017-08-23 – 2017-08-24 (×4): 10 mg via ORAL
  Filled 2017-08-23 (×4): qty 2

## 2017-08-23 MED ORDER — MIDAZOLAM HCL 2 MG/2ML IJ SOLN
INTRAMUSCULAR | Status: AC
  Administered 2017-08-23: 2 mg
  Filled 2017-08-23: qty 2

## 2017-08-23 MED ORDER — PHENYLEPHRINE HCL 10 MG/ML IJ SOLN
INTRAVENOUS | Status: DC | PRN
  Administered 2017-08-23: 20 ug/min via INTRAVENOUS

## 2017-08-23 MED ORDER — PROPOFOL 10 MG/ML IV BOLUS
INTRAVENOUS | Status: DC | PRN
  Administered 2017-08-23: 20 mg via INTRAVENOUS

## 2017-08-23 MED ORDER — OXYCODONE HCL 5 MG PO TABS
ORAL_TABLET | ORAL | Status: AC
  Filled 2017-08-23: qty 2

## 2017-08-23 MED ORDER — ONDANSETRON HCL 4 MG/2ML IJ SOLN
INTRAMUSCULAR | Status: AC
  Filled 2017-08-23: qty 2

## 2017-08-23 MED ORDER — CHLORHEXIDINE GLUCONATE 4 % EX LIQD: 60.0000 mL | Freq: Once | CUTANEOUS | Status: DC

## 2017-08-23 MED ORDER — DEXAMETHASONE SODIUM PHOSPHATE 10 MG/ML IJ SOLN
INTRAMUSCULAR | Status: DC | PRN
  Administered 2017-08-23: 10 mg via INTRAVENOUS

## 2017-08-23 MED ORDER — ACETAMINOPHEN 500 MG PO TABS
1000.0000 mg | ORAL_TABLET | Freq: Four times a day (QID) | ORAL | Status: AC
  Administered 2017-08-23 – 2017-08-24 (×4): 1000 mg via ORAL
  Filled 2017-08-23 (×4): qty 2

## 2017-08-23 MED ORDER — CEFAZOLIN SODIUM-DEXTROSE 2-4 GM/100ML-% IV SOLN
2.0000 g | Freq: Four times a day (QID) | INTRAVENOUS | Status: AC
  Administered 2017-08-23 – 2017-08-24 (×2): 2 g via INTRAVENOUS
  Filled 2017-08-23 (×2): qty 100

## 2017-08-23 MED ORDER — MENTHOL 3 MG MT LOZG: 1.0000 | LOZENGE | OROMUCOSAL | Status: DC | PRN

## 2017-08-23 MED ORDER — LEVOTHYROXINE SODIUM 25 MCG PO TABS
137.0000 ug | ORAL_TABLET | Freq: Every day | ORAL | Status: DC
  Administered 2017-08-24: 137 ug via ORAL
  Filled 2017-08-23: qty 1

## 2017-08-23 MED ORDER — SODIUM CHLORIDE 0.9 % IR SOLN
Status: DC | PRN
  Administered 2017-08-23: 3000 mL

## 2017-08-23 MED ORDER — CEFUROXIME SODIUM 750 MG IJ SOLR
INTRAMUSCULAR | Status: AC
  Filled 2017-08-23: qty 1500

## 2017-08-23 MED ORDER — ONDANSETRON HCL 4 MG/2ML IJ SOLN: 4.0000 mg | Freq: Four times a day (QID) | INTRAMUSCULAR | Status: DC | PRN

## 2017-08-23 MED ORDER — FENTANYL CITRATE (PF) 100 MCG/2ML IJ SOLN
25.0000 ug | INTRAMUSCULAR | Status: DC | PRN
  Administered 2017-08-23: 50 ug via INTRAVENOUS

## 2017-08-23 MED ORDER — MIDAZOLAM HCL 2 MG/2ML IJ SOLN
INTRAMUSCULAR | Status: AC
  Filled 2017-08-23: qty 2

## 2017-08-23 MED ORDER — METOCLOPRAMIDE HCL 5 MG PO TABS: 5.0000 mg | ORAL_TABLET | Freq: Three times a day (TID) | ORAL | Status: DC | PRN

## 2017-08-23 MED ORDER — CEFAZOLIN SODIUM-DEXTROSE 2-4 GM/100ML-% IV SOLN
2.0000 g | INTRAVENOUS | Status: AC
  Administered 2017-08-23: 2 g via INTRAVENOUS
  Filled 2017-08-23: qty 100

## 2017-08-23 MED ORDER — PROPOFOL 10 MG/ML IV BOLUS
INTRAVENOUS | Status: AC
  Filled 2017-08-23: qty 20

## 2017-08-23 MED ORDER — LACTATED RINGERS IV SOLN: INTRAVENOUS | Status: DC

## 2017-08-23 MED ORDER — OXYCODONE HCL 5 MG PO TABS
5.0000 mg | ORAL_TABLET | ORAL | Status: DC | PRN
Start: 2017-08-23 — End: 2017-08-24

## 2017-08-23 MED ORDER — FENTANYL CITRATE (PF) 100 MCG/2ML IJ SOLN: 100.0000 ug | Freq: Once | INTRAMUSCULAR | Status: DC

## 2017-08-23 MED ORDER — POVIDONE-IODINE 7.5 % EX SOLN
Freq: Once | CUTANEOUS | Status: DC
  Filled 2017-08-23: qty 118

## 2017-08-23 MED ORDER — DOCUSATE SODIUM 100 MG PO CAPS
100.0000 mg | ORAL_CAPSULE | Freq: Two times a day (BID) | ORAL | Status: DC
  Administered 2017-08-23 – 2017-08-24 (×2): 100 mg via ORAL
  Filled 2017-08-23 (×2): qty 1

## 2017-08-23 MED ORDER — PROPOFOL 500 MG/50ML IV EMUL
INTRAVENOUS | Status: DC | PRN
  Administered 2017-08-23: 85 ug/kg/min via INTRAVENOUS

## 2017-08-23 MED ORDER — POTASSIUM CHLORIDE IN NACL 20-0.9 MEQ/L-% IV SOLN
INTRAVENOUS | Status: DC
  Administered 2017-08-23: 15:00:00 via INTRAVENOUS
  Filled 2017-08-23 (×2): qty 1000

## 2017-08-23 MED ORDER — FENTANYL CITRATE (PF) 250 MCG/5ML IJ SOLN
INTRAMUSCULAR | Status: AC
  Filled 2017-08-23: qty 5

## 2017-08-23 MED ORDER — DIPHENHYDRAMINE HCL 12.5 MG/5ML PO ELIX: 12.5000 mg | ORAL_SOLUTION | ORAL | Status: DC | PRN

## 2017-08-23 MED ORDER — HYDROMORPHONE HCL 1 MG/ML IJ SOLN: 0.5000 mg | INTRAMUSCULAR | Status: DC | PRN

## 2017-08-23 MED ORDER — ROPIVACAINE HCL 7.5 MG/ML IJ SOLN
INTRAMUSCULAR | Status: DC | PRN
  Administered 2017-08-23: 20 mL via PERINEURAL

## 2017-08-23 MED ORDER — ONDANSETRON HCL 4 MG/2ML IJ SOLN
INTRAMUSCULAR | Status: DC | PRN
  Administered 2017-08-23: 4 mg via INTRAVENOUS

## 2017-08-23 MED ORDER — METOCLOPRAMIDE HCL 5 MG/ML IJ SOLN: 10.0000 mg | Freq: Once | INTRAMUSCULAR | Status: DC | PRN

## 2017-08-23 MED ORDER — ASPIRIN EC 325 MG PO TBEC
325.0000 mg | DELAYED_RELEASE_TABLET | Freq: Every day | ORAL | Status: DC
  Administered 2017-08-24: 325 mg via ORAL
  Filled 2017-08-23: qty 1

## 2017-08-23 MED ORDER — SODIUM CHLORIDE 0.9 % IJ SOLN
INTRAMUSCULAR | Status: DC | PRN
  Administered 2017-08-23: 50 mL via INTRAVENOUS

## 2017-08-23 MED ORDER — FENTANYL CITRATE (PF) 100 MCG/2ML IJ SOLN
INTRAMUSCULAR | Status: AC
  Administered 2017-08-23: 100 ug
  Filled 2017-08-23: qty 2

## 2017-08-23 MED ORDER — BUPIVACAINE-EPINEPHRINE 0.5% -1:200000 IJ SOLN
INTRAMUSCULAR | Status: DC | PRN
  Administered 2017-08-23: 50 mL

## 2017-08-23 MED ORDER — CEFUROXIME SODIUM 1.5 G IV SOLR
INTRAVENOUS | Status: DC | PRN
  Administered 2017-08-23: 1.5 g via INTRAVENOUS

## 2017-08-23 MED ORDER — LACTATED RINGERS IV SOLN
INTRAVENOUS | Status: DC
  Administered 2017-08-23 (×2): via INTRAVENOUS

## 2017-08-23 MED ORDER — DEXAMETHASONE SODIUM PHOSPHATE 10 MG/ML IJ SOLN
INTRAMUSCULAR | Status: AC
  Filled 2017-08-23: qty 1

## 2017-08-23 MED ORDER — PHENOL 1.4 % MT LIQD: 1.0000 | OROMUCOSAL | Status: DC | PRN

## 2017-08-23 MED ORDER — DEXAMETHASONE SODIUM PHOSPHATE 10 MG/ML IJ SOLN
10.0000 mg | Freq: Three times a day (TID) | INTRAMUSCULAR | Status: DC
  Administered 2017-08-23 – 2017-08-24 (×3): 10 mg via INTRAVENOUS
  Filled 2017-08-23 (×3): qty 1

## 2017-08-23 SURGICAL SUPPLY — 81 items
APL SKNCLS STERI-STRIP NONHPOA (GAUZE/BANDAGES/DRESSINGS) ×1
BANDAGE ACE 6X5 VEL STRL LF (GAUZE/BANDAGES/DRESSINGS) ×2 IMPLANT
BANDAGE ESMARK 6X9 LF (GAUZE/BANDAGES/DRESSINGS) ×1 IMPLANT
BENZOIN TINCTURE PRP APPL 2/3 (GAUZE/BANDAGES/DRESSINGS) ×3 IMPLANT
BLADE SAGITTAL 25.0X1.19X90 (BLADE) ×2 IMPLANT
BLADE SAGITTAL 25.0X1.19X90MM (BLADE) ×1
BLADE SAW SGTL 13X75X1.27 (BLADE) ×3 IMPLANT
BLADE SURG 10 STRL SS (BLADE) ×6 IMPLANT
BNDG CMPR 9X6 STRL LF SNTH (GAUZE/BANDAGES/DRESSINGS) ×1
BNDG CMPR MED 15X6 ELC VLCR LF (GAUZE/BANDAGES/DRESSINGS) ×1
BNDG COHESIVE 4X5 TAN STRL (GAUZE/BANDAGES/DRESSINGS) ×2 IMPLANT
BNDG ELASTIC 6X15 VLCR STRL LF (GAUZE/BANDAGES/DRESSINGS) ×3 IMPLANT
BNDG ESMARK 6X9 LF (GAUZE/BANDAGES/DRESSINGS) ×3
BOWL SMART MIX CTS (DISPOSABLE) ×3 IMPLANT
CAPT KNEE TOTAL 3 ATTUNE ×2 IMPLANT
CEMENT HV SMART SET (Cement) ×6 IMPLANT
CLOSURE STERI-STRIP 1/2X4 (GAUZE/BANDAGES/DRESSINGS) ×1
CLOSURE WOUND 1/2 X4 (GAUZE/BANDAGES/DRESSINGS) ×1
CLSR STERI-STRIP ANTIMIC 1/2X4 (GAUZE/BANDAGES/DRESSINGS) ×1 IMPLANT
COVER SURGICAL LIGHT HANDLE (MISCELLANEOUS) ×3 IMPLANT
CUFF TOURNIQUET SINGLE 34IN LL (TOURNIQUET CUFF) ×3 IMPLANT
DECANTER SPIKE VIAL GLASS SM (MISCELLANEOUS) ×6 IMPLANT
DRAPE EXTREMITY T 121X128X90 (DRAPE) ×3 IMPLANT
DRAPE HALF SHEET 40X57 (DRAPES) ×4 IMPLANT
DRAPE INCISE IOBAN 66X45 STRL (DRAPES) IMPLANT
DRAPE ORTHO SPLIT 77X108 STRL (DRAPES) ×3
DRAPE SURG ORHT 6 SPLT 77X108 (DRAPES) ×1 IMPLANT
DRAPE U-SHAPE 47X51 STRL (DRAPES) ×3 IMPLANT
DRSG AQUACEL AG ADV 3.5X10 (GAUZE/BANDAGES/DRESSINGS) ×3 IMPLANT
DURAPREP 26ML APPLICATOR (WOUND CARE) ×6 IMPLANT
ELECT CAUTERY BLADE 6.4 (BLADE) ×3 IMPLANT
ELECT REM PT RETURN 9FT ADLT (ELECTROSURGICAL) ×3
ELECTRODE REM PT RTRN 9FT ADLT (ELECTROSURGICAL) ×1 IMPLANT
FACESHIELD WRAPAROUND (MASK) ×3 IMPLANT
FACESHIELD WRAPAROUND OR TEAM (MASK) ×1 IMPLANT
GLOVE BIO SURGEON STRL SZ7 (GLOVE) ×3 IMPLANT
GLOVE BIOGEL PI IND STRL 6.5 (GLOVE) IMPLANT
GLOVE BIOGEL PI IND STRL 7.0 (GLOVE) ×1 IMPLANT
GLOVE BIOGEL PI IND STRL 7.5 (GLOVE) ×1 IMPLANT
GLOVE BIOGEL PI INDICATOR 6.5 (GLOVE) ×4
GLOVE BIOGEL PI INDICATOR 7.0 (GLOVE) ×2
GLOVE BIOGEL PI INDICATOR 7.5 (GLOVE) ×2
GLOVE SS BIOGEL STRL SZ 7.5 (GLOVE) ×1 IMPLANT
GLOVE SUPERSENSE BIOGEL SZ 7.5 (GLOVE) ×2
GLOVE SURG SS PI 7.0 STRL IVOR (GLOVE) ×2 IMPLANT
GOWN STRL REUS W/ TWL LRG LVL3 (GOWN DISPOSABLE) ×1 IMPLANT
GOWN STRL REUS W/ TWL XL LVL3 (GOWN DISPOSABLE) ×2 IMPLANT
GOWN STRL REUS W/TWL LRG LVL3 (GOWN DISPOSABLE) ×3
GOWN STRL REUS W/TWL XL LVL3 (GOWN DISPOSABLE) ×6
HANDPIECE INTERPULSE COAX TIP (DISPOSABLE) ×3
HOOD PEEL AWAY FACE SHEILD DIS (HOOD) ×6 IMPLANT
IMMOBILIZER KNEE 22 (SOFTGOODS) ×2 IMPLANT
IMMOBILIZER KNEE 22 UNIV (SOFTGOODS) ×3 IMPLANT
KIT BASIN OR (CUSTOM PROCEDURE TRAY) ×3 IMPLANT
KIT ROOM TURNOVER OR (KITS) ×3 IMPLANT
MANIFOLD NEPTUNE II (INSTRUMENTS) ×3 IMPLANT
MARKER SKIN DUAL TIP RULER LAB (MISCELLANEOUS) ×3 IMPLANT
NEEDLE HYPO 22GX1.5 SAFETY (NEEDLE) ×6 IMPLANT
NS IRRIG 1000ML POUR BTL (IV SOLUTION) ×3 IMPLANT
PACK TOTAL JOINT (CUSTOM PROCEDURE TRAY) ×3 IMPLANT
PAD ARMBOARD 7.5X6 YLW CONV (MISCELLANEOUS) ×6 IMPLANT
SET HNDPC FAN SPRY TIP SCT (DISPOSABLE) ×1 IMPLANT
STRIP CLOSURE SKIN 1/2X4 (GAUZE/BANDAGES/DRESSINGS) ×2 IMPLANT
SUCTION FRAZIER HANDLE 10FR (MISCELLANEOUS) ×2
SUCTION TUBE FRAZIER 10FR DISP (MISCELLANEOUS) ×1 IMPLANT
SUT MNCRL AB 3-0 PS2 18 (SUTURE) ×3 IMPLANT
SUT VIC AB 0 CT1 27 (SUTURE) ×9
SUT VIC AB 0 CT1 27XBRD ANBCTR (SUTURE) ×2 IMPLANT
SUT VIC AB 1 CT1 27 (SUTURE) ×3
SUT VIC AB 1 CT1 27XBRD ANBCTR (SUTURE) ×1 IMPLANT
SUT VIC AB 2-0 CT1 27 (SUTURE) ×6
SUT VIC AB 2-0 CT1 TAPERPNT 27 (SUTURE) ×2 IMPLANT
SYR CONTROL 10ML LL (SYRINGE) ×6 IMPLANT
TOWEL OR 17X24 6PK STRL BLUE (TOWEL DISPOSABLE) ×3 IMPLANT
TOWEL OR 17X26 10 PK STRL BLUE (TOWEL DISPOSABLE) ×3 IMPLANT
TRAY CATH 16FR W/PLASTIC CATH (SET/KITS/TRAYS/PACK) IMPLANT
TRAY FOLEY CATH SILVER 14FR (SET/KITS/TRAYS/PACK) ×2 IMPLANT
TUBE CONNECTING 12'X1/4 (SUCTIONS) ×1
TUBE CONNECTING 12X1/4 (SUCTIONS) ×2 IMPLANT
WATER STERILE IRR 1000ML POUR (IV SOLUTION) ×3 IMPLANT
YANKAUER SUCT BULB TIP NO VENT (SUCTIONS) ×3 IMPLANT

## 2017-08-23 NOTE — Transfer of Care (Signed)
Immediate Anesthesia Transfer of Care Note  Patient: Laurie Mckenzie  Procedure(s) Performed: TOTAL KNEE ARTHROPLASTY (Right )  Patient Location: PACU  Anesthesia Type:Spinal  Level of Consciousness: drowsy and patient cooperative  Airway & Oxygen Therapy: Patient Spontanous Breathing and Patient connected to face mask oxygen  Post-op Assessment: Report given to RN and Post -op Vital signs reviewed and stable  Post vital signs: Reviewed and stable  Last Vitals:  Vitals:   08/23/17 0835 08/23/17 1131  BP: (!) 164/75   Pulse: 72   Resp: (!) 26   Temp:  36.5 C  SpO2: 94%     Last Pain:  Vitals:   08/23/17 1131  TempSrc:   PainSc: 0-No pain      Patients Stated Pain Goal: 3 (83/29/19 1660)  Complications: No apparent anesthesia complications

## 2017-08-23 NOTE — Interval H&P Note (Signed)
History and Physical Interval Note:  08/23/2017 7:04 AM  Laurie Mckenzie  has presented today for surgery, with the diagnosis of djd right knee  The various methods of treatment have been discussed with the patient and family. After consideration of risks, benefits and other options for treatment, the patient has consented to  Procedure(s): TOTAL KNEE ARTHROPLASTY (Right) as a surgical intervention .  The patient's history has been reviewed, patient examined, no change in status, stable for surgery.  I have reviewed the patient's chart and labs.  Questions were answered to the patient's satisfaction.     Lorn Junes

## 2017-08-23 NOTE — Anesthesia Preprocedure Evaluation (Addendum)
Anesthesia Evaluation  Patient identified by MRN, date of birth, ID band Patient awake    Reviewed: Allergy & Precautions, NPO status , Patient's Chart, lab work & pertinent test results  Airway Mallampati: II  TM Distance: >3 FB Neck ROM: Full    Dental no notable dental hx.    Pulmonary neg pulmonary ROS,    Pulmonary exam normal breath sounds clear to auscultation       Cardiovascular hypertension, Normal cardiovascular exam Rhythm:Regular Rate:Normal     Neuro/Psych negative neurological ROS  negative psych ROS   GI/Hepatic negative GI ROS, Neg liver ROS,   Endo/Other  Hypothyroidism   Renal/GU negative Renal ROS  negative genitourinary   Musculoskeletal negative musculoskeletal ROS (+)   Abdominal   Peds negative pediatric ROS (+)  Hematology negative hematology ROS (+)   Anesthesia Other Findings   Reproductive/Obstetrics negative OB ROS                            Anesthesia Physical Anesthesia Plan  ASA: II  Anesthesia Plan: Spinal   Post-op Pain Management:  Regional for Post-op pain   Induction:   PONV Risk Score and Plan: 2 and Ondansetron and Treatment may vary due to age or medical condition  Airway Management Planned: Simple Face Mask  Additional Equipment:   Intra-op Plan:   Post-operative Plan:   Informed Consent: I have reviewed the patients History and Physical, chart, labs and discussed the procedure including the risks, benefits and alternatives for the proposed anesthesia with the patient or authorized representative who has indicated his/her understanding and acceptance.   Dental advisory given  Plan Discussed with:   Anesthesia Plan Comments:        Anesthesia Quick Evaluation

## 2017-08-23 NOTE — Evaluation (Signed)
Physical Therapy Evaluation Patient Details Name: Laurie Mckenzie MRN: [id number] DOB: 1950-09-19 Today's Date: 08/23/2017   History of Present Illness  Pt is a 67 y/o female s/p elective R TKA. PMH inlcudes HTN and L partial knee replacement per pt.   Clinical Impression  Pt is s/p surgery above with deficits below. Pt tolerated gait training well, requiring min guard A with RW. HEP initiated and reviewed knee precautions. Will continue to follow acutely to maximize functional mobility independence and safety.     Follow Up Recommendations Follow surgeon's recommendation for DC plan and follow-up therapies;Supervision for mobility/OOB    Equipment Recommendations  None recommended by PT    Recommendations for Other Services       Precautions / Restrictions Precautions Precautions: Knee Precaution Booklet Issued: Yes (comment) Precaution Comments: Reviewed knee precautions and supine ther ex.  Required Braces or Orthoses: Knee Immobilizer - Right Knee Immobilizer - Right: Other (comment)(until good quad control ) Restrictions Weight Bearing Restrictions: Yes RLE Weight Bearing: Weight bearing as tolerated      Mobility  Bed Mobility Overal bed mobility: Needs Assistance Bed Mobility: Supine to Sit     Supine to sit: Supervision     General bed mobility comments: Supervision for safety. Use of bed rails and elevated HOB.   Transfers Overall transfer level: Needs assistance Equipment used: Rolling walker (2 wheeled) Transfers: Sit to/from Stand Sit to Stand: Min guard         General transfer comment: Min guard for safety. Verbal cues for sequencing to stand using KI and for safe hand placement.   Ambulation/Gait Ambulation/Gait assistance: Min guard Ambulation Distance (Feet): 75 Feet Assistive device: Rolling walker (2 wheeled) Gait Pattern/deviations: Step-to pattern;Decreased step length - right;Decreased step length - left;Decreased weight shift to  right;Antalgic Gait velocity: Decreased  Gait velocity interpretation: Below normal speed for age/gender General Gait Details: Slow, antalgic gait. Slight knee buckling initially, however, improved control noted with increased distance. Verbal cues for sequencing using RW.   Stairs            Wheelchair Mobility    Modified Rankin (Stroke Patients Only)       Balance Overall balance assessment: Needs assistance Sitting-balance support: No upper extremity supported;Feet supported Sitting balance-Leahy Scale: Good     Standing balance support: Bilateral upper extremity supported;During functional activity Standing balance-Leahy Scale: Poor Standing balance comment: Reliant on BUE support.                              Pertinent Vitals/Pain Pain Assessment: 0-10 Pain Score: 7  Pain Location: R knee  Pain Descriptors / Indicators: Aching;Operative site guarding Pain Intervention(s): Limited activity within patient's tolerance;Monitored during session;Repositioned    Home Living Family/patient expects to be discharged to:: Private residence Living Arrangements: Alone Available Help at Discharge: Friend(s);Available 24 hours/day Type of Home: House Home Access: Ramped entrance     Home Layout: One level Home Equipment: Other (comment);Walker - 2 wheels;Bedside commode;Cane - single point      Prior Function Level of Independence: Independent               Hand Dominance   Dominant Hand: Right    Extremity/Trunk Assessment   Upper Extremity Assessment Upper Extremity Assessment: Defer to OT evaluation    Lower Extremity Assessment Lower Extremity Assessment: RLE deficits/detail RLE Deficits / Details: Some numbness reported in RLE. Deficits consistent with post op pain and weakness.  Able to perform ther ex below.     Cervical / Trunk Assessment Cervical / Trunk Assessment: Normal  Communication   Communication: No difficulties   Cognition Arousal/Alertness: Awake/alert Behavior During Therapy: WFL for tasks assessed/performed Overall Cognitive Status: Within Functional Limits for tasks assessed                                        General Comments      Exercises Total Joint Exercises Ankle Circles/Pumps: AROM;Both;20 reps Quad Sets: AROM;Right;10 reps Towel Squeeze: AROM;Both;10 reps Heel Slides: AROM;Right;10 reps   Assessment/Plan    PT Assessment Patient needs continued PT services  PT Problem List Decreased strength;Decreased range of motion;Decreased balance;Decreased mobility;Decreased knowledge of use of DME;Decreased knowledge of precautions;Impaired sensation;Pain       PT Treatment Interventions DME instruction;Functional mobility training;Therapeutic activities;Therapeutic exercise;Balance training;Neuromuscular re-education;Gait training;Patient/family education    PT Goals (Current goals can be found in the Care Plan section)  Acute Rehab PT Goals Patient Stated Goal: to go home tomorrow  PT Goal Formulation: With patient Time For Goal Achievement: 09/06/17 Potential to Achieve Goals: Good    Frequency 7X/week   Barriers to discharge        Co-evaluation               AM-PAC PT "6 Clicks" Daily Activity  Outcome Measure Difficulty turning over in bed (including adjusting bedclothes, sheets and blankets)?: A Little Difficulty moving from lying on back to sitting on the side of the bed? : A Little Difficulty sitting down on and standing up from a chair with arms (e.g., wheelchair, bedside commode, etc,.)?: Unable Help needed moving to and from a bed to chair (including a wheelchair)?: A Little Help needed walking in hospital room?: A Little Help needed climbing 3-5 steps with a railing? : A Little 6 Click Score: 16    End of Session Equipment Utilized During Treatment: Gait belt;Right knee immobilizer Activity Tolerance: Patient tolerated treatment  well Patient left: in chair;with call bell/phone within reach;with family/visitor present Nurse Communication: Mobility status PT Visit Diagnosis: Other abnormalities of gait and mobility (R26.89);Pain Pain - Right/Left: Right Pain - part of body: Knee    Time: 5035-4656 PT Time Calculation (min) (ACUTE ONLY): 36 min   Charges:   PT Evaluation $PT Eval Low Complexity: 1 Low PT Treatments $Gait Training: 8-22 mins   PT G Codes:        Leighton Ruff, PT, DPT  Acute Rehabilitation Services  Pager: [phone]   Rudean Hitt 08/23/2017, 4:57 PM

## 2017-08-23 NOTE — Anesthesia Postprocedure Evaluation (Signed)
Anesthesia Post Note  Patient: Laurie Mckenzie  Procedure(s) Performed: TOTAL KNEE ARTHROPLASTY (Right )     Patient location during evaluation: PACU Anesthesia Type: Spinal Level of consciousness: awake and alert Pain management: pain level controlled Vital Signs Assessment: post-procedure vital signs reviewed and stable Respiratory status: spontaneous breathing and respiratory function stable Cardiovascular status: blood pressure returned to baseline and stable Postop Assessment: no headache, no backache, spinal receding and no apparent nausea or vomiting Anesthetic complications: no    Last Vitals:  Vitals:   08/23/17 1215 08/23/17 1255  BP: 136/88 (!) 147/64  Pulse: 83 83  Resp: 17 18  Temp: 37.1 C (!) 36.2 C  SpO2: 95% 100%    Last Pain:  Vitals:   08/23/17 1255  TempSrc: Oral  PainSc:                  Montez Hageman

## 2017-08-23 NOTE — Progress Notes (Signed)
Orthopedic Tech Progress Note Patient Details:  Laurie Mckenzie 1950/11/18 [id number]  CPM Right Knee CPM Right Knee: On Right Knee Flexion (Degrees): 9 Right Knee Extension (Degrees): 0 Additional Comments: trapeze bar patient helper  Post Interventions Patient Tolerated: Well Instructions Provided: Care of device  Hildred Priest 08/23/2017, 1:12 PM Viewed order from doctor's order list

## 2017-08-23 NOTE — Anesthesia Procedure Notes (Signed)
Anesthesia Regional Block: Adductor canal block   Pre-Anesthetic Checklist: ,, timeout performed, Correct Patient, Correct Site, Correct Laterality, Correct Procedure, Correct Position, site marked, Risks and benefits discussed,  Surgical consent,  Pre-op evaluation,  At surgeon's request and post-op pain management  Laterality: Right and Lower  Prep: Maximum Sterile Barrier Precautions used, chloraprep       Needles:  Injection technique: Single-shot  Needle Type: Echogenic Stimulator Needle     Needle Length: 10cm      Additional Needles:   Procedures:,,,, ultrasound used (permanent image in chart),,,,  Narrative:  Start time: 08/23/2017 8:21 AM End time: 08/23/2017 8:31 AM Injection made incrementally with aspirations every 5 mL.  Performed by: Personally  Anesthesiologist: Montez Hageman, MD  Additional Notes: Risks, benefits and alternative to block explained extensively.  Patient tolerated procedure well, without complications.

## 2017-08-23 NOTE — Op Note (Signed)
MRN:     [id number] DOB/AGE:    1950-10-09 / 67 y.o.       OPERATIVE REPORT    DATE OF PROCEDURE:  08/23/2017       PREOPERATIVE DIAGNOSIS:   Primary localized OA right knee      Estimated body mass index is 35.87 kg/m as calculated from the following:   Height as of this encounter: 5\' 4"  (1.626 m).   Weight as of this encounter: 94.8 kg (209 lb).                                                        POSTOPERATIVE DIAGNOSIS:   same                                                                     PROCEDURE:  Procedure(s): TOTAL KNEE ARTHROPLASTY Using Depuy Attune RP implants #5 Femur, #5Tibia, 33mm  RP bearing, 29 Patella     SURGEON: Lorn Junes    ASSISTANT:  Kirstin Shepperson PA-C   (Present and scrubbed throughout the [place], critical for assistance with exposure, retraction, instrumentation, and closure.)         ANESTHESIA: Spinal with Adductor Nerve Block     TOURNIQUET TIME: 20URK   COMPLICATIONS:  None     SPECIMENS: None   INDICATIONS FOR PROCEDURE: The patient has  djd right knee, varus deformities, XR shows bone on bone arthritis. Patient has failed all conservative measures including anti-inflammatory medicines, narcotics, attempts at  exercise and weight loss, cortisone injections and viscosupplementation.  Risks and benefits of surgery have been discussed, questions answered.   DESCRIPTION OF PROCEDURE: The patient identified by armband, received  right femoral nerve block and IV antibiotics, in the holding area at [facility]. Patient taken to the operating room, appropriate anesthetic  monitors were attached spinal anesthesia induced with  the patient in supine position, Foley catheter was inserted. Tourniquet  applied high to the operative thigh. Lateral post and foot positioner  applied to the table, the lower extremity was then prepped and draped  in usual sterile fashion from the ankle to the tourniquet. Time-out procedure was performed. The  limb was wrapped with an Esmarch bandage and the tourniquet inflated to 365 mmHg. We began the operation by making the anterior midline incision starting at handbreadth above the patella going over the patella 1 cm medial to and  4 cm distal to the tibial tubercle. Small bleeders in the skin and the  subcutaneous tissue identified and cauterized. Transverse retinaculum was incised and reflected medially and a medial parapatellar arthrotomy was accomplished. the patella was everted and theprepatellar fat pad resected. The superficial medial collateral  ligament was then elevated from anterior to posterior along the proximal  flare of the tibia and anterior half of the menisci resected. The knee was hyperflexed exposing bone on bone arthritis. Peripheral and notch osteophytes as well as the cruciate ligaments were then resected. We continued to  work our way around posteriorly along the proximal tibia, and externally  rotated the tibia subluxing it out  from underneath the femur. A McHale  retractor was placed through the notch and a lateral Hohmann retractor  placed, and we then drilled through the proximal tibia in line with the  axis of the tibia followed by an intramedullary guide rod and 2-degree  posterior slope cutting guide. The tibial cutting guide was pinned into place  allowing resection of 4 mm of bone medially and about 6 mm of bone  laterally because of her varus deformity. Satisfied with the tibial resection, we then  entered the distal femur 2 mm anterior to the PCL origin with the  intramedullary guide rod and applied the distal femoral cutting guide  set at 49mm, with 5 degrees of valgus. This was pinned along the  epicondylar axis. At this point, the distal femoral cut was accomplished without difficulty. We then sized for a #5 femoral component and pinned the guide in 3 degrees of external rotation.The chamfer cutting guide was pinned into place. The anterior, posterior, and chamfer  cuts were accomplished without difficulty followed by  the  RP box cutting guide and the box cut. We also removed posterior osteophytes from the posterior femoral condyles. At this  time, the knee was brought into full extension. We checked our  extension and flexion gaps and found them symmetric at 57mm.  The patella thickness measured at 25 mm. We set the cutting guide at 15 and removed the posterior 9.5-10 mm  of the patella sized for 29 button and drilled the lollipop. The knee  was then once again hyperflexed exposing the proximal tibia. We sized for a #5 tibial base plate, applied the smokestack and the conical reamer followed by the the Delta fin keel punch. We then hammered into place the  RP trial femoral component, inserted a 1 trial bearing, trial patellar button, and took the knee through range of motion from 0-130 degrees. No thumb pressure was required for patellar  tracking. At this point, all trial components were removed, a double batch of DePuy HV cement with 1500 mg of Zinacef was mixed and applied to all bony metallic mating surfaces except for the posterior condyles of the femur itself. In order, we  hammered into place the tibial tray and removed excess cement, the femoral component and removed excess cement, a 55mm  RP bearing  was inserted, and the knee brought to full extension with compression.  The patellar button was clamped into place, and excess cement  removed. While the cement cured the wound was irrigated out with normal saline solution pulse lavage.. Ligament stability and patellar tracking were checked and found to be excellent.. The parapatellar arthrotomy was closed with  #1 Vicryl suture. The subcutaneous tissue with 0 and 2-0 undyed  Vicryl suture, and 4-0 Monocryl.. A dressing of Aquaseal,  4 x 4, dressing sponges, Webril, and Ace wrap applied. Needle and sponge count were correct times 2.The patient awakened, extubated, and taken to recovery room without  difficulty. Vascular status was normal, pulses 2+ and symmetric.   Lorn Junes 08/23/2017, 10:55 AM

## 2017-08-24 ENCOUNTER — Encounter (HOSPITAL_COMMUNITY): Payer: Self-pay | Admitting: Orthopedic Surgery

## 2017-08-24 DIAGNOSIS — M1711 Unilateral primary osteoarthritis, right knee: Secondary | ICD-10-CM | POA: Diagnosis not present

## 2017-08-24 LAB — BASIC METABOLIC PANEL
Anion gap: 12 (ref 5–15)
BUN: 11 mg/dL (ref 6–20)
CALCIUM: 9.2 mg/dL (ref 8.9–10.3)
CO2: 22 mmol/L (ref 22–32)
Chloride: 104 mmol/L (ref 101–111)
Creatinine, Ser: 0.79 mg/dL (ref 0.44–1.00)
GFR calc Af Amer: >60 mL/min (ref >60)
GLUCOSE: 152 mg/dL — AB (ref 65–99)
Potassium: 4.1 mmol/L (ref 3.5–5.1)
Sodium: 138 mmol/L (ref 135–145)

## 2017-08-24 LAB — URINE CULTURE: Culture: NO GROWTH

## 2017-08-24 LAB — CBC
HCT: 38.3 % (ref 36.0–46.0)
Hemoglobin: 12.1 g/dL (ref 12.0–15.0)
MCH: 30.5 pg (ref 26.0–34.0)
MCHC: 31.6 g/dL (ref 30.0–36.0)
MCV: 96.5 fL (ref 78.0–100.0)
PLATELETS: 253 10*3/uL (ref 150–400)
RBC: 3.97 MIL/uL (ref 3.87–5.11)
RDW: 12.6 % (ref 11.5–15.5)
WBC: 15.4 10*3/uL — ABNORMAL HIGH (ref 4.0–10.5)

## 2017-08-24 MED ORDER — POLYETHYLENE GLYCOL 3350 17 G PO PACK: PACK | ORAL | 0 refills | Status: DC

## 2017-08-24 MED ORDER — OXYCODONE HCL 5 MG PO TABS: ORAL_TABLET | ORAL | 0 refills | Status: DC

## 2017-08-24 MED ORDER — ASPIRIN 325 MG PO TBEC: 325.0000 mg | DELAYED_RELEASE_TABLET | Freq: Every day | ORAL | 0 refills | Status: DC

## 2017-08-24 MED ORDER — DOCUSATE SODIUM 100 MG PO CAPS: ORAL_CAPSULE | ORAL | 0 refills | Status: DC

## 2017-08-24 NOTE — Progress Notes (Signed)
Removed IV, provided discharge education/instructions, all questions and concerns addressed, Pt not in distress, discharged home with belongings. 

## 2017-08-24 NOTE — Care Management Note (Signed)
[facility] Management Note  Patient Details  Name: Laurie Mckenzie MRN: [id number] Date of Birth: 15-Aug-1950  Subjective/Objective: 67 yr old female s/p right total knee arthroplasty.                  Action/Plan: [facility] manager spoke with patient concerning discharge plan and DME. Patient was preoepratively setup with [facility], no changes. She will have support of a friend at discharge.   Expected Discharge Date:  08/24/17               Expected Discharge Plan:  [place]  In-House Referral:  NA  Discharge planning Services  CM Consult  Post Acute Care Choice:  Home Health Choice offered to:  Patient  DME Arranged:  CPM(has RW, 3in1,CPM ) DME Agency:  [employer]  HH Arranged:  PT HH Agency:  [facility] (formerly [employer])  Status of Service:  Completed, signed off  If discussed at [employer], dates discussed:    Additional Comments:  Ninfa Meeker, RN 08/24/2017, 10:58 AM

## 2017-08-24 NOTE — Discharge Summary (Signed)
Patient ID: Laurie Mckenzie MRN: [id number] DOB/AGE: Sep 09, 1950 67 y.o.  Admit date: 08/23/2017 Discharge date: 08/24/2017  Admission Diagnoses:  Principal Problem:   Primary localized osteoarthritis of right knee Active Problems:   Hypothyroidism   Hypertension   Discharge Diagnoses:  Same  Past Medical History:  Diagnosis Date  . Headache   . Hypertension    186/106 today    195/108 07/27/2017  . Hypothyroidism   . Primary osteoarthritis of knee    Right    Surgeries: Procedure(s): TOTAL KNEE ARTHROPLASTY on 08/23/2017   Consultants:   Discharged Condition: Improved  Hospital Course: Kysa Calais Vokes is an 67 y.o. female who was admitted 08/23/2017 for operative treatment ofPrimary localized osteoarthritis of right knee. Patient has severe unremitting pain that affects sleep, daily activities, and work/hobbies. After pre-op clearance the patient was taken to the operating room on 08/23/2017 and underwent  Procedure(s): TOTAL KNEE ARTHROPLASTY.    Patient was given perioperative antibiotics:  Anti-infectives (From admission, onward)   Start     Dose/Rate Route Frequency Ordered Stop   08/23/17 1400  ceFAZolin (ANCEF) IVPB 2g/100 mL premix     2 g 200 mL/hr over 30 Minutes Intravenous Every 6 hours 08/23/17 1239 08/24/17 0253   08/23/17 1006  cefUROXime (ZINACEF) injection  Status:  Discontinued       As needed 08/23/17 1007 08/23/17 1126   08/23/17 0644  ceFAZolin (ANCEF) IVPB 2g/100 mL premix     2 g 200 mL/hr over 30 Minutes Intravenous On call to O.R. 08/23/17 5784 08/23/17 6962       Patient was given sequential compression devices, early ambulation, and chemoprophylaxis to prevent DVT.  Patient benefited maximally from hospital stay and there were no complications.    Recent vital signs:  Patient Vitals for the past 24 hrs:  BP Temp Temp src Pulse Resp SpO2  08/24/17 0516 140/66 97.6 F (36.4 C) Oral 80 17 95 %  08/24/17 0031 128/71 97.9 F (36.6 C) Oral 79 16  96 %  08/23/17 1939 136/71 98.4 F (36.9 C) Oral 80 17 97 %  08/23/17 1255 (!) 147/64 (!) 97.2 F (36.2 C) Oral 83 18 100 %  08/23/17 1215 136/88 98.7 F (37.1 C) - 83 17 95 %  08/23/17 1200 (!) 145/80 - - 76 20 92 %  08/23/17 1145 131/81 - - 75 18 96 %  08/23/17 1131 (!) 132/57 97.7 F (36.5 C) - 83 17 95 %     Recent laboratory studies:  Recent Labs    08/24/17 0534  WBC 15.4*  HGB 12.1  HCT 38.3  PLT 253  NA 138  K 4.1  CL 104  CO2 22  BUN 11  CREATININE 0.79  GLUCOSE 152*  CALCIUM 9.2     Discharge Medications:   Allergies as of 08/24/2017      Reactions   Bee Venom Swelling, Other (See Comments)   Red and hot to site   Fish Allergy Hives   Ivp Dye [iodinated Diagnostic Agents] Other (See Comments)   MD advised not to use, due to fish allergies      Medication List    TAKE these medications   acetaminophen 500 MG tablet Commonly known as:  TYLENOL Take 1,000-1,500 mg by mouth every 6 (six) hours as needed for moderate pain or headache.   aspirin 325 MG EC tablet Take 1 tablet (325 mg total) by mouth daily with breakfast.   CALCIUM 1200 PO Take 2 tablets  by mouth daily.   docusate sodium 100 MG capsule Commonly known as:  COLACE 1 tab 2 times a day while on narcotics.  STOOL SOFTENER   EPINEPHrine 0.3 mg/0.3 mL Soaj injection Commonly known as:  EPI-PEN Inject 0.3 mg into the muscle once.   levothyroxine 137 MCG tablet Commonly known as:  SYNTHROID, LEVOTHROID Take 137 mcg by mouth daily before breakfast.   montelukast 10 MG tablet Commonly known as:  SINGULAIR Take 10 mg by mouth at bedtime.   multivitamin tablet Take 1 tablet by mouth daily.   oxyCODONE 5 MG immediate release tablet Commonly known as:  Oxy IR/ROXICODONE 1 po q 4 hrs prn pain   polyethylene glycol packet Commonly known as:  MIRALAX / GLYCOLAX 17grams in 6 oz of drink twice a day until bowel movement.  LAXITIVE.  Restart if two days since last bowel movement    VITAMIN D3 PO Take 1 capsule by mouth daily.            Discharge Care Instructions  (From admission, onward)        Start     Ordered   08/24/17 0000  Change dressing    Comments:  DO NOT REMOVE BANDAGE OVER SURGICAL INCISION.  [place] WHOLE LEG INCLUDING OVER THE WATERPROOF BANDAGE WITH SOAP AND WATER EVERY DAY.   08/24/17 0843      Diagnostic Studies: No results found.  Disposition: 01-Home or Self Care  Discharge Instructions    Ambulatory referral to Physical Therapy   Complete by:  As directed    Dx s/p right total knee replacement for primary localized osteoarthritis of the right knee  Rx Eval and treat 2-3 times a week for 8 weeks for accelerated total knee protocol.  Work on Range of motion, gait training, balance, strengthening.  START OUTPATIENT PT ON MARCH 12.  WILL BE GETTING HOME HEALTH PT PRIOR  SURGERY DATE WAS 08/22/2017   CPM   Complete by:  As directed    Continuous passive motion machine (CPM):      Use the CPM from 0 to 90 for 6 hours per day.       You may break it up into 2 or 3 sessions per day.      Use CPM for 2 weeks or until you are told to stop.   Call MD / Call 911   Complete by:  As directed    If you experience chest pain or shortness of breath, CALL 911 and be transported to the hospital emergency room.  If you develope a fever above 101 F, pus (white drainage) or increased drainage or redness at the wound, or calf pain, call your surgeon's office.   Change dressing   Complete by:  As directed    DO NOT REMOVE BANDAGE OVER SURGICAL INCISION.  [place] WHOLE LEG INCLUDING OVER THE WATERPROOF BANDAGE WITH SOAP AND WATER EVERY DAY.   Constipation Prevention   Complete by:  As directed    Drink plenty of fluids.  Prune juice may be helpful.  You may use a stool softener, such as Colace (over the counter) 100 mg twice a day.  Use MiraLax (over the counter) for constipation as needed.   Diet - low sodium heart healthy   Complete by:  As directed     Discharge instructions   Complete by:  As directed    INSTRUCTIONS AFTER JOINT REPLACEMENT   Remove items at home which could result in a fall. This includes  throw rugs or furniture in walking pathways ICE to the affected joint every three hours while awake for 30 minutes at a time, for at least the first 3-5 days, and then as needed for pain and swelling.  Continue to use ice for pain and swelling. You may notice swelling that will progress down to the foot and ankle.  This is normal after surgery.  Elevate your leg when you are not up walking on it.   Continue to use the breathing machine you got in the hospital (incentive spirometer) which will help keep your temperature down.  It is common for your temperature to cycle up and down following surgery, especially at night when you are not up moving around and exerting yourself.  The breathing machine keeps your lungs expanded and your temperature down.   DIET:  As you were doing prior to hospitalization, we recommend a well-balanced diet.  DRESSING / WOUND CARE / SHOWERING  Keep the surgical dressing until follow up.  The dressing is water proof, so you can shower without any extra covering.  IF THE DRESSING FALLS OFF or the wound gets wet inside, change the dressing with sterile gauze.  Please use good hand washing techniques before changing the dressing.  Do not use any lotions or creams on the incision until instructed by your surgeon.    ACTIVITY  Increase activity slowly as tolerated, but follow the weight bearing instructions below.   No driving for 6 weeks or until further direction given by your physician.  You cannot drive while taking narcotics.  No lifting or carrying greater than 10 lbs. until further directed by your surgeon. Avoid periods of inactivity such as sitting longer than an hour when not asleep. This helps prevent blood clots.  You may return to work once you are authorized by your doctor.     WEIGHT BEARING    Weight bearing as tolerated with assist device (walker, cane, etc) as directed, use it as long as suggested by your surgeon or therapist, typically at least 2-3 weeks.   EXERCISES  Results after joint replacement surgery are often greatly improved when you follow the exercise, range of motion and muscle strengthening exercises prescribed by your doctor. Safety measures are also important to protect the joint from further injury. Any time any of these exercises cause you to have increased pain or swelling, decrease what you are doing until you are comfortable again and then slowly increase them. If you have problems or questions, call your caregiver or physical therapist for advice.   Rehabilitation is important following a joint replacement. After just a few days of immobilization, the muscles of the leg can become weakened and shrink (atrophy).  These exercises are designed to build up the tone and strength of the thigh and leg muscles and to improve motion. Often times heat used for twenty to thirty minutes before working out will loosen up your tissues and help with improving the range of motion but do not use heat for the first two weeks following surgery (sometimes heat can increase post-operative swelling).   These exercises can be done on a training (exercise) mat, on the floor, on a table or on a bed. Use whatever works the best and is most comfortable for you.    Use music or television while you are exercising so that the exercises are a pleasant break in your day. This will make your life better with the exercises acting as a break in your routine that  you can look forward to.   Perform all exercises about fifteen times, three times per day or as directed.  You should exercise both the operative leg and the other leg as well.   Exercises include:  Quad Sets - Tighten up the muscle on the front of the thigh (Quad) and hold for 5-10 seconds.   Straight Leg Raises - With your knee straight  (if you were given a brace, keep it on), lift the leg to 60 degrees, hold for 3 seconds, and slowly lower the leg.  Perform this exercise against resistance later as your leg gets stronger.  Leg Slides: Lying on your back, slowly slide your foot toward your buttocks, bending your knee up off the floor (only go as far as is comfortable). Then slowly slide your foot back down until your leg is flat on the floor again.  Angel Wings: Lying on your back spread your legs to the side as far apart as you can without causing discomfort.  Hamstring Strength:  Lying on your back, push your heel against the floor with your leg straight by tightening up the muscles of your buttocks.  Repeat, but this time bend your knee to a comfortable angle, and push your heel against the floor.  You may put a pillow under the heel to make it more comfortable if necessary.   A rehabilitation program following joint replacement surgery can speed recovery and prevent re-injury in the future due to weakened muscles. Contact your doctor or a physical therapist for more information on knee rehabilitation.    CONSTIPATION  Constipation is defined medically as fewer than three stools per week and severe constipation as less than one stool per week.  Even if you have a regular bowel pattern at home, your normal regimen is likely to be disrupted due to multiple reasons following surgery.  Combination of anesthesia, postoperative narcotics, change in appetite and fluid intake all can affect your bowels.   YOU MUST use at least one of the following options; they are listed in order of increasing strength to get the job done.  They are all available over the counter, and you may need to use some, POSSIBLY even all of these options:    Drink plenty of fluids (prune juice may be helpful) and high fiber foods Colace 100 mg by mouth twice a day  Senokot for constipation as directed and as needed Dulcolax (bisacodyl), take with full glass of  water  Miralax (polyethylene glycol) once or twice a day as needed.  If you have tried all these things and are unable to have a bowel movement in the first 3-4 days after surgery call either your surgeon or your primary doctor.    If you experience loose stools or diarrhea, hold the medications until you stool forms back up.  If your symptoms do not get better within 1 week or if they get worse, check with your doctor.  If you experience "the worst abdominal pain ever" or develop nausea or vomiting, please contact the office immediately for further recommendations for treatment.   ITCHING:  If you experience itching with your medications, try taking only a single pain pill, or even half a pain pill at a time.  You can also use Benadryl over the counter for itching or also to help with sleep.   TED HOSE STOCKINGS:  Use stockings on both legs until for at least 2 weeks or as directed by physician office. They may be removed at  night for sleeping.  MEDICATIONS:  See your medication summary on the "After Visit Summary" that nursing will review with you.  You may have some home medications which will be placed on hold until you complete the course of blood thinner medication.  It is important for you to complete the blood thinner medication as prescribed.  PRECAUTIONS:  If you experience chest pain or shortness of breath - call 911 immediately for transfer to the hospital emergency department.   If you develop a fever greater that 101 F, purulent drainage from wound, increased redness or drainage from wound, foul odor from the wound/dressing, or calf pain - CONTACT YOUR SURGEON.                                                   FOLLOW-UP APPOINTMENTS:  If you do not already have a post-op appointment, please call the office for an appointment to be seen by your surgeon.  Guidelines for how soon to be seen are listed in your "After Visit Summary", but are typically between 1-4 weeks after  surgery.  OTHER INSTRUCTIONS:   Knee Replacement:  Do not place pillow under knee, focus on keeping the knee straight while resting. CPM instructions: 0-90 degrees, 2 hours in the morning, 2 hours in the afternoon, and 2 hours in the evening. Place foam block, curve side up under heel at all times except when in CPM or when walking.  DO NOT modify, tear, cut, or change the foam block in any way.  MAKE SURE YOU:  Understand these instructions.  Get help right away if you are not doing well or get worse.    Thank you for letting us be a part of your medical care team.  It is a privilege we respect greatly.  We hope these instructions will help you stay on track for a fast and full recovery!   Do not put a pillow under the knee. Place it under the heel.   Complete by:  As directed    Place gray foam block, curve side up under heel at all times except when in CPM or when walking.  DO NOT modify, tear, cut, or change in any way the gray foam block.   Increase activity slowly as tolerated   Complete by:  As directed    Patient may shower   Complete by:  As directed    Aquacel dressing is water proof    Wash over it and the whole leg with soap and water at the end of your shower   TED hose   Complete by:  As directed    Use stockings (TED hose) for 2 weeks on both leg(s).  You may remove them at night for sleeping.      Follow-up Information    Elsie Saas, MD Follow up on 09/06/2017.   Specialty:  Orthopedic Surgery Why:  appointment time 2:15pm Contact information: [place] [address] [phone]          Call [facility] in [place] to schedule PT visit for March 12.  Order has been sent to them  Signed: Linda Hedges 08/24/2017, 9:27 AM

## 2017-08-24 NOTE — Plan of Care (Signed)
  Nutrition: Adequate nutrition will be maintained 08/24/2017 0934 - Progressing by Williams Che, RN   Elimination: Will not experience complications related to bowel motility 08/24/2017 0934 - Progressing by Williams Che, RN   Pain Managment: General experience of comfort will improve 08/24/2017 0934 - Progressing by Williams Che, RN   Safety: Ability to remain free from injury will improve 08/24/2017 0934 - Progressing by Williams Che, RN

## 2017-08-24 NOTE — Progress Notes (Signed)
Physical Therapy Treatment Patient Details Name: Laurie Mckenzie MRN: [id number] DOB: 1951-06-23 Today's Date: 08/24/2017    History of Present Illness Pt is a 67 y/o female s/p elective R TKA. PMH inlcudes HTN and L partial knee replacement per pt.     PT Comments    Pt progressing well with mobility. Pt reports good pain control and demonstrated the ability to perform transfers and ambulation at the mod I level. Pt has completed acute PT goals and is safe for d/c from a PT perspective. Pt educated on car transfer, HEP, and safety with activity progression. Will sign off at this time. If needs change, please reconsult.   Follow Up Recommendations  Follow surgeon's recommendation for DC plan and follow-up therapies;Supervision for mobility/OOB     Equipment Recommendations  None recommended by PT    Recommendations for Other Services       Precautions / Restrictions Precautions Precautions: Knee Precaution Booklet Issued: Yes (comment) Precaution Comments: Reviewed knee precautions and supine ther ex.  Restrictions Weight Bearing Restrictions: Yes RLE Weight Bearing: Weight bearing as tolerated    Mobility  Bed Mobility               General bed mobility comments: Pt sitting up in recliner upon PT arrival.   Transfers Overall transfer level: Modified independent Equipment used: Rolling walker (2 wheeled) Transfers: Sit to/from Stand           General transfer comment: Pt demonstrated proper hand placement on seated surface for safety. No assist required.   Ambulation/Gait Ambulation/Gait assistance: Min guard Ambulation Distance (Feet): 500 Feet Assistive device: Rolling walker (2 wheeled) Gait Pattern/deviations: Decreased step length - left;Decreased weight shift to right;Antalgic;Step-through pattern Gait velocity: Decreased  Gait velocity interpretation: Below normal speed for age/gender General Gait Details: VC's for improved gait technique and equal  step/stride length. Good fluid movement of RW throughout gait training.    Stairs Stairs: Yes   Stair Management: Two rails;Step to pattern;Forwards Number of Stairs: 10(5x2) General stair comments: VC's for sequencing and general safety with stair negotiation.   Wheelchair Mobility    Modified Rankin (Stroke Patients Only)       Balance Overall balance assessment: Needs assistance Sitting-balance support: No upper extremity supported;Feet supported Sitting balance-Leahy Scale: Good     Standing balance support: Bilateral upper extremity supported;During functional activity Standing balance-Leahy Scale: Poor Standing balance comment: Reliant on BUE support.                             Cognition Arousal/Alertness: Awake/alert Behavior During Therapy: WFL for tasks assessed/performed Overall Cognitive Status: Within Functional Limits for tasks assessed                                        Exercises Total Joint Exercises Heel Slides: 10 reps Goniometric ROM: 08-85 AROM in sitting    General Comments        Pertinent Vitals/Pain Pain Assessment: Faces Faces Pain Scale: Hurts a little bit Pain Location: R knee  Pain Descriptors / Indicators: Aching;Operative site guarding Pain Intervention(s): Monitored during session    Home Living                      Prior Function            PT Goals (current goals  can now be found in the care plan section) Acute Rehab PT Goals Patient Stated Goal: home today PT Goal Formulation: With patient Time For Goal Achievement: 09/06/17 Potential to Achieve Goals: Good Progress towards PT goals: Progressing toward goals    Frequency    7X/week      PT Plan Current plan remains appropriate    Co-evaluation              AM-PAC PT "6 Clicks" Daily Activity  Outcome Measure  Difficulty turning over in bed (including adjusting bedclothes, sheets and blankets)?:  None Difficulty moving from lying on back to sitting on the side of the bed? : None Difficulty sitting down on and standing up from a chair with arms (e.g., wheelchair, bedside commode, etc,.)?: None Help needed moving to and from a bed to chair (including a wheelchair)?: None Help needed walking in hospital room?: None Help needed climbing 3-5 steps with a railing? : A Little 6 Click Score: 23    End of Session Equipment Utilized During Treatment: Gait belt Activity Tolerance: Patient tolerated treatment well Patient left: in chair;with call bell/phone within reach;with family/visitor present Nurse Communication: Mobility status PT Visit Diagnosis: Other abnormalities of gait and mobility (R26.89);Pain Pain - Right/Left: Right Pain - part of body: Knee     Time: 3220-2542 PT Time Calculation (min) (ACUTE ONLY): 38 min  Charges:  $Gait Training: 23-37 mins $Therapeutic Exercise: 8-22 mins                    G Codes:       Rolinda Roan, PT, DPT Acute Rehabilitation Services Pager: [phone]    Thelma Comp 08/24/2017, 9:29 AM

## 2017-09-07 ENCOUNTER — Ambulatory Visit: Payer: Medicare Other | Attending: Physician Assistant | Admitting: Physical Therapy

## 2017-09-07 DIAGNOSIS — M25561 Pain in right knee: Secondary | ICD-10-CM | POA: Insufficient documentation

## 2017-09-07 DIAGNOSIS — R600 Localized edema: Secondary | ICD-10-CM | POA: Diagnosis present

## 2017-09-07 DIAGNOSIS — G8929 Other chronic pain: Secondary | ICD-10-CM | POA: Insufficient documentation

## 2017-09-07 DIAGNOSIS — M25661 Stiffness of right knee, not elsewhere classified: Secondary | ICD-10-CM | POA: Diagnosis present

## 2017-09-07 NOTE — Therapy (Signed)
[facility] [place], [place], [address] Phone: [phone]   Fax:  [phone]  Physical Therapy Evaluation  Patient Details  Name: Laurie Mckenzie MRN: [id number] Date of Birth: 07-01-1950 Referring Provider: Elsie Saas MD   Encounter Date: 09/07/2017  PT End of Session - 09/07/17 0922    Visit Number  1    Number of Visits  16    Date for PT Re-Evaluation  12/06/17    Authorization Type  FOTO AT LEAST EVERY 5TH VISIT.    PT Start Time  [phone]    PT Stop Time  [phone]    PT Time Calculation (min)  48 min    Activity Tolerance  Patient tolerated treatment well    Behavior During Therapy  [facility] for tasks assessed/performed       Past Medical History:  Diagnosis Date  . Headache   . Hypertension    186/106 today    195/108 07/27/2017  . Hypothyroidism   . Primary osteoarthritis of knee    Right    Past Surgical History:  Procedure Laterality Date  . COLONOSCOPY N/A 01/16/2015   Procedure: COLONOSCOPY;  Surgeon: Daneil Dolin, MD;  [facility];  Service: Endoscopy;  Laterality: N/A;  8:30 AM  . KNEE ARTHROPLASTY Left 2011   [place]  . TOTAL KNEE ARTHROPLASTY Right 08/23/2017   Procedure: TOTAL KNEE ARTHROPLASTY;  Surgeon: Elsie Saas, MD;  Location: [place];  Service: Orthopedics;  Laterality: Right;    There were no vitals filed for this visit.   Subjective Assessment - 09/07/17 0934    Subjective  The patient underwent a righ ttotal knee replacement on 08/23/17.  She has been complinat to a HEP, using the "Zero Knee" and CPM.  Her resting pain-level is a 4/10 today and of course higher with range of motion activites.  Rest makes her knee feel better.  Pain wakes her at night.    Pertinent History  Left hemi-knee.  HTN.    How long can you walk comfortably?  Short community distances    Patient Stated Goals  Get back to normal.    Pain Score  4     Pain Location  Knee    Pain Orientation  Right     Pain Descriptors / Indicators  Aching;Discomfort    Pain Frequency  Constant         [facility] PT Assessment - 09/07/17 0001      Assessment   Medical Diagnosis  Primary OA of right knee.    Referring Provider  Elsie Saas MD    Onset Date/Surgical Date  -- 08/23/17 (surgery date).      Precautions   Precautions  -- NO ULTRASOUND.      Restrictions   Weight Bearing Restrictions  No      Balance Screen   Has the patient fallen in the past 6 months  No    Has the patient had a decrease in activity level because of a fear of falling?   Yes    Is the patient reluctant to leave their home because of a fear of falling?   No      Home Environment   Living Environment  Private residence      Prior Function   Level of Independence  Independent      Observation/Other Assessments   Observations  Left knee incision loks to be healing well.  Patient is also complaint to to wearing  TED hose and is to do so for another week.    Focus on Therapeutic Outcomes (FOTO)   72% limitation.      ROM / Strength   AROM / PROM / Strength  AROM;Strength      AROM   Overall AROM Comments  -5 degrees of right knee extension with active right knee flexion to 90 degrees and passive to 95 degrees.      Strength   Overall Strength Comments  Right hip and knee strength= 4+/5.      Palpation   Palpation comment  Mild right knee tenderness around patella.      Special Tests   Other special tests  Circumferential measurement at mid-patellar position 4 cms > on right than left.      Ambulation/Gait   Gait Comments  Essentially normal gait cycle.  Patient holding a straight cane but not using it.             Objective measurements completed on examination: See above findings.      [facility] Adult PT Treatment/Exercise - 09/07/17 0001      Exercises   Exercises  Knee/Hip      Knee/Hip Exercises: Aerobic   Nustep  10 minutes at level 3 moving forward x 1 to increase flexion.      Modalities    Modalities  Vasopneumatic      Vasopneumatic   Number Minutes Vasopneumatic   10 minutes    Vasopnuematic Location   -- Right knee.    Vasopneumatic Pressure  Medium                  PT Long Term Goals - 09/07/17 1052      PT LONG TERM GOAL #1   Title  Independent with a HEP.    Time  8    Period  Weeks    Status  New      PT LONG TERM GOAL #2   Title  Full active right knee extension in order to normalize gait.    Time  8    Period  Weeks    Status  New      PT LONG TERM GOAL #3   Title  Active knee flexion to 115 degrees+ so the patient can perform functional tasks and do so with pain not > 2-3/10.    Time  8    Period  Weeks    Status  New      PT LONG TERM GOAL #4   Title  Decrease edema to within 2 cms of contralateral side to assist with range of motion gains and decrease pain.      PT LONG TERM GOAL #5   Title  Perform a reciprocating stair gait with one railing with pain not > 2-3/10.    Time  8    Period  Weeks    Status  New             Plan - 09/07/17 1026    Clinical Impression Statement  The patient presents to OPPT s/p right total knee replacement performed on 08/23/17.  She is doing very well thus far with an expected loss of range of motion and edema. her gait cycle is essentially normal.  The patient will benefit from skilled physical therapy intervention per a total knee replacement protocol.      History and Personal Factors relevant to plan of care:  Left hemi-knee; HTN.    Clinical Presentation  Stable  Clinical Presentation due to:  Excellent surgical outcome.    Clinical Decision Making  Low    Rehab Potential  Excellent    PT Frequency  2x / week    PT Duration  8 weeks    PT Treatment/Interventions  ADLs/Self Care Home Management;Cryotherapy;Electrical Stimulation;Stair training;Functional mobility training;Therapeutic activities;Therapeutic exercise;Neuromuscular re-education;Patient/family education;Manual  techniques;Passive range of motion;Vasopneumatic Device    PT Next Visit Plan  Nustep and then please progress to stationary bike soon; strengthening; vasopneumatic and electical stimulation.    Consulted and Agree with Plan of Care  Patient       Patient will benefit from skilled therapeutic intervention in order to improve the following deficits and impairments:  Abnormal gait, Decreased activity tolerance, Decreased range of motion, Decreased strength, Increased edema, Pain  Visit Diagnosis: Chronic pain of right knee - Plan: PT plan of care cert/re-cert  Localized edema - Plan: PT plan of care cert/re-cert  Stiffness of right knee, not elsewhere classified - Plan: PT plan of care cert/re-cert     Problem List Patient Active Problem List   Diagnosis Date Noted  . Primary localized osteoarthritis of right knee 08/23/2017  . Hypothyroidism   . Hypertension   . Special screening for malignant neoplasms, colon     APPLEGATE, [place] MPT 09/07/2017, 11:02 AM  [facility] [place], [place], [address] Phone: [phone]   Fax:  [phone]  Name: Sinaya Minogue Cressler MRN: [id number] Date of Birth: [birth year]

## 2017-09-08 ENCOUNTER — Ambulatory Visit: Payer: Medicare Other | Admitting: Physical Therapy

## 2017-09-08 ENCOUNTER — Encounter: Payer: Self-pay | Admitting: Physical Therapy

## 2017-09-08 DIAGNOSIS — M25561 Pain in right knee: Secondary | ICD-10-CM | POA: Diagnosis not present

## 2017-09-08 DIAGNOSIS — R600 Localized edema: Secondary | ICD-10-CM

## 2017-09-08 DIAGNOSIS — M25661 Stiffness of right knee, not elsewhere classified: Secondary | ICD-10-CM

## 2017-09-08 DIAGNOSIS — G8929 Other chronic pain: Secondary | ICD-10-CM

## 2017-09-08 NOTE — Therapy (Signed)
[facility] [place], [place], [address] Phone: [phone]   Fax:  [phone]  Physical Therapy Treatment  Patient Details  Name: Laurie Mckenzie MRN: [id number] Date of Birth: [birth year] Referring Provider: Elsie Saas MD   Encounter Date: 09/08/2017  PT End of Session - 09/08/17 0851    Visit Number  2    Number of Visits  16    Date for PT Re-Evaluation  12/06/17    Authorization Type  FOTO AT LEAST EVERY 5TH VISIT.    PT Start Time  0814    PT Stop Time  0910    PT Time Calculation (min)  56 min    Activity Tolerance  Patient tolerated treatment well    Behavior During Therapy  [facility] for tasks assessed/performed       Past Medical History:  Diagnosis Date  . Headache   . Hypertension    186/106 today    195/108 07/27/2017  . Hypothyroidism   . Primary osteoarthritis of knee    Right    Past Surgical History:  Procedure Laterality Date  . COLONOSCOPY N/A 01/16/2015   Procedure: COLONOSCOPY;  Surgeon: Daneil Dolin, MD;  [facility];  Service: Endoscopy;  Laterality: N/A;  8:30 AM  . KNEE ARTHROPLASTY Left 2011   [place]  . TOTAL KNEE ARTHROPLASTY Right 08/23/2017   Procedure: TOTAL KNEE ARTHROPLASTY;  Surgeon: Elsie Saas, MD;  Location: [place];  Service: Orthopedics;  Laterality: Right;    There were no vitals filed for this visit.  Subjective Assessment - 09/08/17 0824    Subjective  Patient arrived with some discomfort in lateral knee    Pertinent History  Left hemi-knee.  HTN.    How long can you walk comfortably?  Short community distances    Patient Stated Goals  Get back to normal.    Currently in Pain?  Yes    Pain Score  3     Pain Location  Knee    Pain Orientation  Right    Pain Descriptors / Indicators  Discomfort    Pain Type  Surgical pain    Pain Onset  1 to 4 weeks ago    Pain Frequency  Intermittent    Aggravating Factors   increased activity    Pain Relieving  Factors  rest         [facility] PT Assessment - 09/08/17 0001      ROM / Strength   AROM / PROM / Strength  AROM;PROM      AROM   AROM Assessment Site  Knee    Right/Left Knee  Right    Right Knee Extension  -5    Right Knee Flexion  90      PROM   PROM Assessment Site  Knee    Right/Left Knee  Right    Right Knee Extension  0    Right Knee Flexion  100                  [facility] Adult PT Treatment/Exercise - 09/08/17 0001      Knee/Hip Exercises: Stretches   Active Hamstring Stretch  Right;3 reps;30 seconds      Knee/Hip Exercises: Aerobic   Nustep  L4 x74min UE/LE      Knee/Hip Exercises: Standing   Rocker Board  2 minutes      Knee/Hip Exercises: Supine   Bridges  Strengthening;Both;20 reps    Straight Leg Raises  Strengthening;Right;20 reps    Straight Leg Raise with External Rotation  Strengthening;20 reps      Knee/Hip Exercises: Sidelying   Hip ABduction  Strengthening;Right;20 reps      Modalities   Modalities  Electrical Stimulation;Vasopneumatic      Electrical Stimulation   Electrical Stimulation Location  right knee    Electrical Stimulation Action  IFC 1-10hz  x80min    Electrical Stimulation Goals  Pain;Edema      Vasopneumatic   Number Minutes Vasopneumatic   15 minutes    Vasopnuematic Location   Knee    Vasopneumatic Pressure  Medium      Manual Therapy   Manual Therapy  Passive ROM    Passive ROM  PROM for right knee flexion/ext with gentle holds end range             PT Education - 09/08/17 0854    Education provided  Yes    Education Details  HEP ROM/strength progression    Person(s) Educated  Patient    Methods  Explanation;Demonstration;Handout    Comprehension  Verbalized understanding;Returned demonstration          PT Long Term Goals - 09/08/17 0850      PT LONG TERM GOAL #1   Title  Independent with a HEP.    Time  8    Period  Weeks    Status  On-going      PT LONG TERM GOAL #2   Title  Full active  right knee extension in order to normalize gait.    Time  8    Period  Weeks    Status  On-going      PT LONG TERM GOAL #3   Title  Active knee flexion to 115 degrees+ so the patient can perform functional tasks and do so with pain not > 2-3/10.    Time  8    Period  Weeks    Status  On-going      PT LONG TERM GOAL #4   Title  Decrease edema to within 2 cms of contralateral side to assist with range of motion gains and decrease pain.    Time  8    Period  Weeks    Status  On-going      PT LONG TERM GOAL #5   Title  Perform a reciprocating stair gait with one railing with pain not > 2-3/10.    Time  8    Period  Weeks    Status  On-going            Plan - 09/08/17 0854    Clinical Impression Statement  Patient tolerated treatrment well today. Patient has little discomfort at rest and more with activity. Patient doing well with ROM for right knee flexion and ext. HEP provided today for right knee ROM activities and strengthening progression. Patient progressing toward goals.     Rehab Potential  Excellent    Clinical Impairments Affecting Rehab Potential  surgery 08/23/17 2 weeks 09/06/17    PT Frequency  2x / week    PT Duration  8 weeks    PT Treatment/Interventions  ADLs/Self Care Home Management;Cryotherapy;Electrical Stimulation;Stair training;Functional mobility training;Therapeutic activities;Therapeutic exercise;Neuromuscular re-education;Patient/family education;Manual techniques;Passive range of motion;Vasopneumatic Device    PT Next Visit Plan  Nustep and then please progress to stationary bike soon; strengthening; vasopneumatic and electical stimulation.    Consulted and Agree with Plan of Care  Patient       Patient will benefit  from skilled therapeutic intervention in order to improve the following deficits and impairments:  Abnormal gait, Decreased activity tolerance, Decreased range of motion, Decreased strength, Increased edema, Pain  Visit Diagnosis: Chronic  pain of right knee  Localized edema  Stiffness of right knee, not elsewhere classified     Problem List Patient Active Problem List   Diagnosis Date Noted  . Primary localized osteoarthritis of right knee 08/23/2017  . Hypothyroidism   . Hypertension   . Special screening for malignant neoplasms, colon     Avea Mcgowen P, PTA 09/08/2017, 9:11 AM  [facility] [place], [place], [address] Phone: [phone]   Fax:  [phone]  Name: Nolah Krenzer Bhavsar MRN: [id number] Date of Birth: 1951/06/23

## 2017-09-08 NOTE — Patient Instructions (Addendum)
Knee Extension Mobilization: Towel Prop   With rolled towel under right ankle, place _1+_ pound weight across knee. Hold __5+__ minutes. Repeat __2-3__ times per set. Do __2__ sets per session. Do __2-4__ sessions per day.      Knee Flexion Stretch on Step  Place foot on step and lean forward until you feel a good stretch in front of knee.   hold 30 sec x 5-10 perform 2-4 x daily  stretch 30 sec x5-10 2-3 x daily     Strengthening: Hip Abduction (Side-Lying)  Strengthening: Straight Leg Raise (Phase 1)  Repeat _10___ times per set. Do __2__ sets per session. Do __2__ sessions per day.    Bridging   Slowly raise buttocks from floor, keeping stomach tight. Repeat _10___ times per set. Do __2__ sets per session. Do __2__ sessions per day.   Straight Leg Raise   Tighten stomach and slowly raise locked right leg __4__ inches from floor. Repeat __10-30__ times per set. Do __2__ sets per session. Do __2__ sessions per day.

## 2017-09-13 ENCOUNTER — Ambulatory Visit: Payer: Medicare Other | Admitting: Physical Therapy

## 2017-09-13 ENCOUNTER — Encounter: Payer: Self-pay | Admitting: Physical Therapy

## 2017-09-13 DIAGNOSIS — G8929 Other chronic pain: Secondary | ICD-10-CM

## 2017-09-13 DIAGNOSIS — M25561 Pain in right knee: Principal | ICD-10-CM

## 2017-09-13 DIAGNOSIS — M25661 Stiffness of right knee, not elsewhere classified: Secondary | ICD-10-CM

## 2017-09-13 DIAGNOSIS — R600 Localized edema: Secondary | ICD-10-CM

## 2017-09-13 NOTE — Therapy (Signed)
[facility] [place], [place], [address] Phone: [phone]   Fax:  [phone]  Physical Therapy Treatment  Patient Details  Name: Laurie Mckenzie MRN: [id number] Date of Birth: [birth year] Referring Provider: Elsie Saas MD   Encounter Date: 09/13/2017  PT End of Session - 09/13/17 0813    Visit Number  3    Number of Visits  16    Date for PT Re-Evaluation  12/06/17    Authorization Type  FOTO AT LEAST EVERY 5TH VISIT.    PT Start Time  0729    PT Stop Time  0828    PT Time Calculation (min)  59 min    Activity Tolerance  Patient tolerated treatment well    Behavior During Therapy  [facility] for tasks assessed/performed       Past Medical History:  Diagnosis Date  . Headache   . Hypertension    186/106 today    195/108 07/27/2017  . Hypothyroidism   . Primary osteoarthritis of knee    Right    Past Surgical History:  Procedure Laterality Date  . COLONOSCOPY N/A 01/16/2015   Procedure: COLONOSCOPY;  Surgeon: Daneil Dolin, MD;  [facility];  Service: Endoscopy;  Laterality: N/A;  8:30 AM  . KNEE ARTHROPLASTY Left 2011   [place]  . TOTAL KNEE ARTHROPLASTY Right 08/23/2017   Procedure: TOTAL KNEE ARTHROPLASTY;  Surgeon: Elsie Saas, MD;  Location: [place];  Service: Orthopedics;  Laterality: Right;    There were no vitals filed for this visit.  Subjective Assessment - 09/13/17 0732    Subjective  Patient arrived with some ongoing discomfort lateral knee     Pertinent History  Left hemi-knee.  HTN.    How long can you walk comfortably?  Short community distances    Patient Stated Goals  Get back to normal.    Currently in Pain?  Yes    Pain Score  3     Pain Location  Knee    Pain Orientation  Right    Pain Descriptors / Indicators  Discomfort;Sore    Pain Type  Surgical pain    Pain Onset  1 to 4 weeks ago    Pain Frequency  Intermittent    Aggravating Factors   increased activity    Pain  Relieving Factors  at rest         [facility] PT Assessment - 09/13/17 0001      AROM   AROM Assessment Site  Knee    Right/Left Knee  Right    Right Knee Extension  -4    Right Knee Flexion  90      PROM   PROM Assessment Site  Knee    Right/Left Knee  Right    Right Knee Extension  0    Right Knee Flexion  101                  [facility] Adult PT Treatment/Exercise - 09/13/17 0001      Knee/Hip Exercises: Aerobic   Nustep  L4 x52min UE/LE      Knee/Hip Exercises: Standing   Forward Step Up  3 sets;10 reps;Step Height: 6";Right    Rocker Board  2 minutes      Acupuncturist Location  right knee    Electrical Stimulation Action  IFC 1-10hz  x68min    Electrical Stimulation Goals  Pain;Edema      Vasopneumatic  Number Minutes Vasopneumatic   15 minutes    Vasopnuematic Location   Knee    Vasopneumatic Pressure  Medium      Manual Therapy   Manual Therapy  Passive ROM;Soft tissue mobilization    Soft tissue mobilization  manual STW to lateral knee and ITband area of tightness     Passive ROM  PROM for right knee flexion/ext with gentle holds end range                  PT Long Term Goals - 09/08/17 0850      PT LONG TERM GOAL #1   Title  Independent with a HEP.    Time  8    Period  Weeks    Status  On-going      PT LONG TERM GOAL #2   Title  Full active right knee extension in order to normalize gait.    Time  8    Period  Weeks    Status  On-going      PT LONG TERM GOAL #3   Title  Active knee flexion to 115 degrees+ so the patient can perform functional tasks and do so with pain not > 2-3/10.    Time  8    Period  Weeks    Status  On-going      PT LONG TERM GOAL #4   Title  Decrease edema to within 2 cms of contralateral side to assist with range of motion gains and decrease pain.    Time  8    Period  Weeks    Status  On-going      PT LONG TERM GOAL #5   Title  Perform a reciprocating stair gait  with one railing with pain not > 2-3/10.    Time  8    Period  Weeks    Status  On-going            Plan - 09/13/17 0813    Clinical Impression Statement  Patient tolerated treatment well today. Patient has some reported ongoing soreness in lateral knee, performed manual STW to lateral knee and IT band area of palpable tighness and discomfort to reduce pain and tone. Patient has improved ROM in right knee. Patient doing HEP daily and progressing toward goals.     Rehab Potential  Excellent    Clinical Impairments Affecting Rehab Potential  surgery 08/23/17 3 weeks 09/13/17    PT Frequency  2x / week    PT Duration  8 weeks    PT Treatment/Interventions  ADLs/Self Care Home Management;Cryotherapy;Electrical Stimulation;Stair training;Functional mobility training;Therapeutic activities;Therapeutic exercise;Neuromuscular re-education;Patient/family education;Manual techniques;Passive range of motion;Vasopneumatic Device    PT Next Visit Plan  Nustep and then please progress to stationary bike soon; strengthening; vasopneumatic and electical stimulation.    Consulted and Agree with Plan of Care  Patient       Patient will benefit from skilled therapeutic intervention in order to improve the following deficits and impairments:  Abnormal gait, Decreased activity tolerance, Decreased range of motion, Decreased strength, Increased edema, Pain  Visit Diagnosis: Chronic pain of right knee  Localized edema  Stiffness of right knee, not elsewhere classified     Problem List Patient Active Problem List   Diagnosis Date Noted  . Primary localized osteoarthritis of right knee 08/23/2017  . Hypothyroidism   . Hypertension   . Special screening for malignant neoplasms, colon     Myrtice Lowdermilk P, PTA 09/13/2017, 8:32 AM  [facility] [place], [place], [address] Phone: [phone]   Fax:  [phone]  Name: Laurie Spiewak  Mckenzie MRN: [id number] Date of Birth: 04-24-1951

## 2017-09-15 ENCOUNTER — Encounter: Payer: Self-pay | Admitting: Physical Therapy

## 2017-09-15 ENCOUNTER — Ambulatory Visit: Payer: Medicare Other | Admitting: Physical Therapy

## 2017-09-15 DIAGNOSIS — M25561 Pain in right knee: Principal | ICD-10-CM

## 2017-09-15 DIAGNOSIS — R600 Localized edema: Secondary | ICD-10-CM

## 2017-09-15 DIAGNOSIS — M25661 Stiffness of right knee, not elsewhere classified: Secondary | ICD-10-CM

## 2017-09-15 DIAGNOSIS — G8929 Other chronic pain: Secondary | ICD-10-CM

## 2017-09-15 NOTE — Therapy (Signed)
[facility] [place], [place], [address] Phone: [phone]   Fax:  [phone]  Physical Therapy Treatment  Patient Details  Name: Laurie Mckenzie MRN: [id number] Date of Birth: 04/09/1951 Referring Provider: Elsie Saas MD   Encounter Date: 09/15/2017  PT End of Session - 09/15/17 0829    Visit Number  4    Number of Visits  16    Date for PT Re-Evaluation  12/06/17    Authorization Type  FOTO AT LEAST EVERY 5TH VISIT.    PT Start Time  0730    PT Stop Time  0828    PT Time Calculation (min)  58 min    Activity Tolerance  Patient tolerated treatment well    Behavior During Therapy  WFL for tasks assessed/performed       Past Medical History:  Diagnosis Date  . Headache   . Hypertension    186/106 today    195/108 07/27/2017  . Hypothyroidism   . Primary osteoarthritis of knee    Right    Past Surgical History:  Procedure Laterality Date  . COLONOSCOPY N/A 01/16/2015   Procedure: COLONOSCOPY;  Surgeon: Daneil Dolin, MD;  [facility];  Service: Endoscopy;  Laterality: N/A;  8:30 AM  . KNEE ARTHROPLASTY Left 2011   [place]  . TOTAL KNEE ARTHROPLASTY Right 08/23/2017   Procedure: TOTAL KNEE ARTHROPLASTY;  Surgeon: Elsie Saas, MD;  Location: [place];  Service: Orthopedics;  Laterality: Right;    There were no vitals filed for this visit.  Subjective Assessment - 09/15/17 0739    Subjective  Reports just some ache in her knee but states that she does not like taking the pain medications.     Pertinent History  Left hemi-knee.  HTN.    How long can you walk comfortably?  Short community distances    Patient Stated Goals  Get back to normal.    Currently in Pain?  Yes    Pain Score  -- "not that bad"    Pain Location  Knee    Pain Orientation  Right    Pain Descriptors / Indicators  Aching    Pain Type  Surgical pain    Pain Onset  1 to 4 weeks ago         [facility] PT Assessment -  09/15/17 0001      Assessment   Medical Diagnosis  Primary OA of right knee.    Onset Date/Surgical Date  08/23/17    Next MD Visit  10/05/2017                  [facility] Adult PT Treatment/Exercise - 09/15/17 0001      Knee/Hip Exercises: Aerobic   Nustep  L4 x70min UE/LE      Knee/Hip Exercises: Standing   Hip Flexion  AROM;Right;20 reps;Knee bent    Forward Lunges  Right;2 sets;10 reps;3 seconds    Rocker Board  4 minutes      Modalities   Modalities  Psychologist, educational Location  R knee    Electrical Stimulation Action  IFC    Electrical Stimulation Parameters  1-10 hz x15 min    Electrical Stimulation Goals  Pain;Edema      Vasopneumatic   Number Minutes Vasopneumatic   15 minutes    Vasopnuematic Location   Knee    Vasopneumatic Pressure  Medium  Vasopneumatic Temperature   34      Manual Therapy   Manual Therapy  Passive ROM;Soft tissue mobilization    Soft tissue mobilization  R patella mobilizations in sup/inf, lat/med to promote mobility    Passive ROM  PROM of R knee into flexion, extension with holds at end range                  PT Long Term Goals - 09/08/17 0850      PT LONG TERM GOAL #1   Title  Independent with a HEP.    Time  8    Period  Weeks    Status  On-going      PT LONG TERM GOAL #2   Title  Full active right knee extension in order to normalize gait.    Time  8    Period  Weeks    Status  On-going      PT LONG TERM GOAL #3   Title  Active knee flexion to 115 degrees+ so the patient can perform functional tasks and do so with pain not > 2-3/10.    Time  8    Period  Weeks    Status  On-going      PT LONG TERM GOAL #4   Title  Decrease edema to within 2 cms of contralateral side to assist with range of motion gains and decrease pain.    Time  8    Period  Weeks    Status  On-going      PT LONG TERM GOAL #5   Title  Perform a reciprocating  stair gait with one railing with pain not > 2-3/10.    Time  8    Period  Weeks    Status  On-going            Plan - 09/15/17 4696    Clinical Impression Statement  Patient tolerated today's treatment well low level R knee aching reported. Patient guided through ROM exercises with good technique and no increased pain reported. Patient's R knee is progressing well with PROM as well with no pain reported by patient with PROM. Increased edema still present throughout the R knee but good R patella mobility assessed. Normal modalities response noted folowing removal of the modalities. TED hose donned to RLE upon arrival.    Rehab Potential  Excellent    Clinical Impairments Affecting Rehab Potential  surgery 08/23/17 3 weeks 09/13/17    PT Frequency  2x / week    PT Duration  8 weeks    PT Treatment/Interventions  ADLs/Self Care Home Management;Cryotherapy;Electrical Stimulation;Stair training;Functional mobility training;Therapeutic activities;Therapeutic exercise;Neuromuscular re-education;Patient/family education;Manual techniques;Passive range of motion;Vasopneumatic Device    PT Next Visit Plan  ATTEMPT STATIONARY BIKE ON 09/20/2017.    Consulted and Agree with Plan of Care  Patient       Patient will benefit from skilled therapeutic intervention in order to improve the following deficits and impairments:  Abnormal gait, Decreased activity tolerance, Decreased range of motion, Decreased strength, Increased edema, Pain  Visit Diagnosis: Chronic pain of right knee  Localized edema  Stiffness of right knee, not elsewhere classified     Problem List Patient Active Problem List   Diagnosis Date Noted  . Primary localized osteoarthritis of right knee 08/23/2017  . Hypothyroidism   . Hypertension   . Special screening for malignant neoplasms, colon     Standley Brooking, PTA 09/15/2017, 9:56 AM  [facility]  [place], [place], [address] Phone: [phone]   Fax:  [phone]  Name: Naomie Crow Steve MRN: [id number] Date of Birth: 06-28-1951

## 2017-09-20 ENCOUNTER — Ambulatory Visit: Payer: Medicare Other | Admitting: Physical Therapy

## 2017-09-20 DIAGNOSIS — R600 Localized edema: Secondary | ICD-10-CM

## 2017-09-20 DIAGNOSIS — M25561 Pain in right knee: Principal | ICD-10-CM

## 2017-09-20 DIAGNOSIS — G8929 Other chronic pain: Secondary | ICD-10-CM

## 2017-09-20 DIAGNOSIS — M25661 Stiffness of right knee, not elsewhere classified: Secondary | ICD-10-CM

## 2017-09-20 NOTE — Therapy (Signed)
[facility] [place], [place], [address] Phone: [phone]   Fax:  [phone]  Physical Therapy Treatment  Patient Details  Name: Laurie Mckenzie MRN: [id number] Date of Birth: [birth year] Referring Provider: Elsie Saas MD   Encounter Date: 09/20/2017  PT End of Session - 09/20/17 0735    Visit Number  5    Number of Visits  16    Date for PT Re-Evaluation  12/06/17    Authorization Type  FOTO AT LEAST EVERY 5TH VISIT.    PT Start Time  0730    PT Stop Time  0828    PT Time Calculation (min)  58 min    Activity Tolerance  Patient tolerated treatment well    Behavior During Therapy  WFL for tasks assessed/performed       Past Medical History:  Diagnosis Date  . Headache   . Hypertension    186/106 today    195/108 07/27/2017  . Hypothyroidism   . Primary osteoarthritis of knee    Right    Past Surgical History:  Procedure Laterality Date  . COLONOSCOPY N/A 01/16/2015   Procedure: COLONOSCOPY;  Surgeon: Daneil Dolin, MD;  [facility];  Service: Endoscopy;  Laterality: N/A;  8:30 AM  . KNEE ARTHROPLASTY Left 2011   [place]  . TOTAL KNEE ARTHROPLASTY Right 08/23/2017   Procedure: TOTAL KNEE ARTHROPLASTY;  Surgeon: Elsie Saas, MD;  Location: [place];  Service: Orthopedics;  Laterality: Right;    There were no vitals filed for this visit.  Subjective Assessment - 09/20/17 0735    Subjective  Patient reported feeling stiff today. Patient reported she is 1 month post op.    Pertinent History  Left hemi-knee.  HTN.    How long can you walk comfortably?  Short community distances    Patient Stated Goals  Get back to normal.    Currently in Pain?  No/denies         [facility] PT Assessment - 09/20/17 0001      Assessment   Medical Diagnosis  Primary OA of right knee.    Onset Date/Surgical Date  08/23/17    Next MD Visit  10/05/2017            No data recorded       [place] [place] Adult PT  Treatment/Exercise - 09/20/17 0001      Knee/Hip Exercises: Aerobic   Stationary Bike  Level 2 x5 minutes seat     Nustep  L4 x10 min UE/LE      Knee/Hip Exercises: Standing   Knee Flexion  AROM;Right;2 sets;10 reps    Hip Abduction  AROM;Both;2 sets;10 reps;Knee straight    Rocker Board  4 minutes      Modalities   Modalities  Psychologist, educational Location  R knee    Electrical Stimulation Action  IFC    Electrical Stimulation Parameters  80-150 hz x15 ,im    Electrical Stimulation Goals  Pain;Edema      Vasopneumatic   Number Minutes Vasopneumatic   15 minutes    Vasopnuematic Location   Knee    Vasopneumatic Pressure  Medium    Vasopneumatic Temperature   34      Manual Therapy   Manual Therapy  Passive ROM;Joint mobilization    Joint Mobilization  inf/sup R patella mobs to improve ROM    Passive ROM  PROM of R  knee into flexion, extension with holds at end range                  PT Long Term Goals - 09/08/17 0850      PT LONG TERM GOAL #1   Title  Independent with a HEP.    Time  8    Period  Weeks    Status  On-going      PT LONG TERM GOAL #2   Title  Full active right knee extension in order to normalize gait.    Time  8    Period  Weeks    Status  On-going      PT LONG TERM GOAL #3   Title  Active knee flexion to 115 degrees+ so the patient can perform functional tasks and do so with pain not > 2-3/10.    Time  8    Period  Weeks    Status  On-going      PT LONG TERM GOAL #4   Title  Decrease edema to within 2 cms of contralateral side to assist with range of motion gains and decrease pain.    Time  8    Period  Weeks    Status  On-going      PT LONG TERM GOAL #5   Title  Perform a reciprocating stair gait with one railing with pain not > 2-3/10.    Time  8    Period  Weeks    Status  On-going            Plan - 09/20/17 6720    Clinical Impression Statement   Patient was able to tolerate treatment well with no reports of pain. Patient was able to complete bike with some hip hiking compensation during start but was able to reduce compensation as knee began to loosen up. Patient educated to continue to wear TED hose stocking to reduce and control edema. Patient reported understanding. FOTO limitation: 52%. No adverse effects noted upon removal of modatlities.    Clinical Presentation  Stable    Clinical Decision Making  Low    Rehab Potential  Excellent    Clinical Impairments Affecting Rehab Potential  surgery 08/23/17 4 weeks 09/20/17    PT Frequency  2x / week    PT Duration  8 weeks    PT Treatment/Interventions  ADLs/Self Care Home Management;Cryotherapy;Electrical Stimulation;Stair training;Functional mobility training;Therapeutic activities;Therapeutic exercise;Neuromuscular re-education;Patient/family education;Manual techniques;Passive range of motion;Vasopneumatic Device    PT Next Visit Plan  continue with stationary bike to improve flexion ROM with no hip compensation.    Consulted and Agree with Plan of Care  Patient       Patient will benefit from skilled therapeutic intervention in order to improve the following deficits and impairments:  Abnormal gait, Decreased activity tolerance, Decreased range of motion, Decreased strength, Increased edema, Pain  Visit Diagnosis: Chronic pain of right knee  Localized edema  Stiffness of right knee, not elsewhere classified     Problem List Patient Active Problem List   Diagnosis Date Noted  . Primary localized osteoarthritis of right knee 08/23/2017  . Hypothyroidism   . Hypertension   . Special screening for malignant neoplasms, colon    Gabriela Eves, PT, DPT 09/20/2017, 8:27 AM  [facility] [address] [place], [place], [address] Phone: [phone]   Fax:  [phone]  Name: Laurie Mckenzie MRN: [id number] Date of Birth:  February 21, 1951

## 2017-09-23 ENCOUNTER — Encounter: Payer: Self-pay | Admitting: Physical Therapy

## 2017-09-23 ENCOUNTER — Ambulatory Visit: Payer: Medicare Other | Admitting: Physical Therapy

## 2017-09-23 DIAGNOSIS — G8929 Other chronic pain: Secondary | ICD-10-CM

## 2017-09-23 DIAGNOSIS — M25561 Pain in right knee: Secondary | ICD-10-CM | POA: Diagnosis not present

## 2017-09-23 DIAGNOSIS — R600 Localized edema: Secondary | ICD-10-CM

## 2017-09-23 DIAGNOSIS — M25661 Stiffness of right knee, not elsewhere classified: Secondary | ICD-10-CM

## 2017-09-23 NOTE — Therapy (Signed)
[facility] [place], [place], [address] Phone: [phone]   Fax:  [phone]  Physical Therapy Treatment  Patient Details  Name: Laurie Mckenzie MRN: [id number] Date of Birth: 07/28/1950 Referring Provider: Elsie Saas MD   Encounter Date: 09/23/2017  PT End of Session - 09/23/17 0814    Visit Number  6    Number of Visits  16    Date for PT Re-Evaluation  12/06/17    Authorization Type  FOTO AT LEAST EVERY 5TH VISIT.    PT Start Time  0729    PT Stop Time  0828    PT Time Calculation (min)  59 min    Activity Tolerance  Patient tolerated treatment well    Behavior During Therapy  [facility] for tasks assessed/performed       Past Medical History:  Diagnosis Date  . Headache   . Hypertension    186/106 today    195/108 07/27/2017  . Hypothyroidism   . Primary osteoarthritis of knee    Right    Past Surgical History:  Procedure Laterality Date  . COLONOSCOPY N/A 01/16/2015   Procedure: COLONOSCOPY;  Surgeon: Daneil Dolin, MD;  [facility];  Service: Endoscopy;  Laterality: N/A;  8:30 AM  . KNEE ARTHROPLASTY Left 2011   [place]  . TOTAL KNEE ARTHROPLASTY Right 08/23/2017   Procedure: TOTAL KNEE ARTHROPLASTY;  Surgeon: Elsie Saas, MD;  Location: [place];  Service: Orthopedics;  Laterality: Right;    There were no vitals filed for this visit.  Subjective Assessment - 09/23/17 0734    Subjective  Patient arrived with reports of doing well and no pain complaints    Pertinent History  Left hemi-knee.  HTN.    How long can you walk comfortably?  Short community distances    Patient Stated Goals  Get back to normal.    Currently in Pain?  No/denies         [facility] PT Assessment - 09/23/17 0001      AROM   AROM Assessment Site  Knee    Right/Left Knee  Right    Right Knee Extension  -3    Right Knee Flexion  93      PROM   PROM Assessment Site  Knee    Right/Left Knee  Right    Right Knee  Extension  0    Right Knee Flexion  104      Special Tests   Other special tests  4cm edema difference            No data recorded       [facility] Adult PT Treatment/Exercise - 09/23/17 0001      Knee/Hip Exercises: Aerobic   Stationary Bike  Level 2 x5 minutes     Nustep  L5 x33min      Knee/Hip Exercises: Standing   Forward Step Up  Right;3 sets;10 reps;Step Height: 6"    Rocker Board  3 minutes      Acupuncturist Location  R knee    Electrical Stimulation Action  IFC    Electrical Stimulation Parameters  80-150ha x59min    Electrical Stimulation Goals  Pain;Edema      Vasopneumatic   Number Minutes Vasopneumatic   15 minutes    Vasopnuematic Location   Knee    Vasopneumatic Pressure  Medium      Manual Therapy   Manual Therapy  Passive ROM  Passive ROM  PROM of R knee into flexion, extension with holds at end range                  PT Long Term Goals - 09/08/17 0850      PT LONG TERM GOAL #1   Title  Independent with a HEP.    Time  8    Period  Weeks    Status  On-going      PT LONG TERM GOAL #2   Title  Full active right knee extension in order to normalize gait.    Time  8    Period  Weeks    Status  On-going      PT LONG TERM GOAL #3   Title  Active knee flexion to 115 degrees+ so the patient can perform functional tasks and do so with pain not > 2-3/10.    Time  8    Period  Weeks    Status  On-going      PT LONG TERM GOAL #4   Title  Decrease edema to within 2 cms of contralateral side to assist with range of motion gains and decrease pain.    Time  8    Period  Weeks    Status  On-going      PT LONG TERM GOAL #5   Title  Perform a reciprocating stair gait with one railing with pain not > 2-3/10.    Time  8    Period  Weeks    Status  On-going            Plan - 09/23/17 0815    Clinical Impression Statement  Patient tolerated treatment well today. Patient has improvement with  right knee flexion yet some limitations due to ongoing edema in knee. Patient has full ext PROM and progressing with right knee strengthening. Patient progressing toward goals.     Rehab Potential  Excellent    Clinical Impairments Affecting Rehab Potential  surgery 08/23/17 4 weeks 09/20/17    PT Frequency  2x / week    PT Duration  8 weeks    PT Treatment/Interventions  ADLs/Self Care Home Management;Cryotherapy;Electrical Stimulation;Stair training;Functional mobility training;Therapeutic activities;Therapeutic exercise;Neuromuscular re-education;Patient/family education;Manual techniques;Passive range of motion;Vasopneumatic Device    PT Next Visit Plan  continue with stationary bike to improve flexion ROM with no hip compensation and progress strengthening (F/U with MD. Noemi Chapel 10/05/17)    Consulted and Agree with Plan of Care  Patient       Patient will benefit from skilled therapeutic intervention in order to improve the following deficits and impairments:  Abnormal gait, Decreased activity tolerance, Decreased range of motion, Decreased strength, Increased edema, Pain  Visit Diagnosis: Chronic pain of right knee  Localized edema  Stiffness of right knee, not elsewhere classified     Problem List Patient Active Problem List   Diagnosis Date Noted  . Primary localized osteoarthritis of right knee 08/23/2017  . Hypothyroidism   . Hypertension   . Special screening for malignant neoplasms, colon     Ariell Gunnels P, PTA 09/23/2017, 8:29 AM  [facility] [place], [place], [address] Phone: [phone]   Fax:  [phone]  Name: Laurie Mckenzie MRN: [id number] Date of Birth: [birth year]

## 2017-09-27 ENCOUNTER — Encounter: Payer: Self-pay | Admitting: Physical Therapy

## 2017-09-27 ENCOUNTER — Ambulatory Visit: Payer: Medicare Other | Attending: Physician Assistant | Admitting: Physical Therapy

## 2017-09-27 DIAGNOSIS — G8929 Other chronic pain: Secondary | ICD-10-CM

## 2017-09-27 DIAGNOSIS — M25561 Pain in right knee: Secondary | ICD-10-CM | POA: Diagnosis not present

## 2017-09-27 DIAGNOSIS — R600 Localized edema: Secondary | ICD-10-CM | POA: Diagnosis present

## 2017-09-27 DIAGNOSIS — M25661 Stiffness of right knee, not elsewhere classified: Secondary | ICD-10-CM | POA: Insufficient documentation

## 2017-09-27 NOTE — Therapy (Signed)
[facility] [place], [place], [address] Phone: [phone]   Fax:  [phone]  Physical Therapy Treatment  Patient Details  Name: Laurie Mckenzie MRN: [id number] Date of Birth: December 29, 1950 Referring Provider: Elsie Saas MD   Encounter Date: 09/27/2017  PT End of Session - 09/27/17 0726    Visit Number  7    Number of Visits  16    Date for PT Re-Evaluation  12/06/17    Authorization Type  FOTO AT LEAST EVERY 5TH VISIT.    PT Start Time  0732    PT Stop Time  0827    PT Time Calculation (min)  55 min    Activity Tolerance  Patient tolerated treatment well    Behavior During Therapy  WFL for tasks assessed/performed       Past Medical History:  Diagnosis Date  . Headache   . Hypertension    186/106 today    195/108 07/27/2017  . Hypothyroidism   . Primary osteoarthritis of knee    Right    Past Surgical History:  Procedure Laterality Date  . COLONOSCOPY N/A 01/16/2015   Procedure: COLONOSCOPY;  Surgeon: Daneil Dolin, MD;  [facility];  Service: Endoscopy;  Laterality: N/A;  8:30 AM  . KNEE ARTHROPLASTY Left 2011   [place]  . TOTAL KNEE ARTHROPLASTY Right 08/23/2017   Procedure: TOTAL KNEE ARTHROPLASTY;  Surgeon: Elsie Saas, MD;  Location: [place];  Service: Orthopedics;  Laterality: Right;    There were no vitals filed for this visit.  Subjective Assessment - 09/27/17 0726    Subjective  Reports that her knee is doing pretty good with only some discomfort along both sides of patella.    Pertinent History  Left hemi-knee.  HTN.    How long can you walk comfortably?  Short community distances    Patient Stated Goals  Get back to normal.    Currently in Pain?  Yes    Pain Score  2     Pain Location  Knee    Pain Orientation  Right;Medial;Lateral    Pain Descriptors / Indicators  Discomfort    Pain Type  Surgical pain    Pain Onset  1 to 4 weeks ago         [facility] PT Assessment -  09/27/17 0001      Assessment   Medical Diagnosis  Primary OA of right knee.    Onset Date/Surgical Date  08/23/17    Next MD Visit  10/05/2017      Restrictions   Weight Bearing Restrictions  No      ROM / Strength   AROM / PROM / Strength  AROM      AROM   Overall AROM   Deficits    AROM Assessment Site  Knee    Right/Left Knee  Right    Right Knee Extension  -5    Right Knee Flexion  100            No data recorded       [facility] Adult PT Treatment/Exercise - 09/27/17 0001      Knee/Hip Exercises: Aerobic   Stationary Bike  L1, seat 5 x15 min      Knee/Hip Exercises: Standing   Forward Lunges  Right;2 sets;10 reps;3 seconds    Forward Step Up  Right;2 sets;10 reps;Hand Hold: 2;Step Height: 6"    Rocker Board  2 minutes      Knee/Hip  Exercises: Supine   Short Arc Quad Sets  Strengthening;Right;2 sets;10 reps;Limitations    Short Arc Quad Sets Limitations  3#    Straight Leg Raises  AROM;Right;2 sets;10 reps      Modalities   Modalities  Theme park manager Action  IFC    Electrical Stimulation Parameters  1-10 hz x15 min    Electrical Stimulation Goals  Pain;Edema      Vasopneumatic   Number Minutes Vasopneumatic   15 minutes    Vasopnuematic Location   Knee    Vasopneumatic Pressure  Medium    Vasopneumatic Temperature   34                  PT Long Term Goals - 09/08/17 0850      PT LONG TERM GOAL #1   Title  Independent with a HEP.    Time  8    Period  Weeks    Status  On-going      PT LONG TERM GOAL #2   Title  Full active right knee extension in order to normalize gait.    Time  8    Period  Weeks    Status  On-going      PT LONG TERM GOAL #3   Title  Active knee flexion to 115 degrees+ so the patient can perform functional tasks and do so with pain not > 2-3/10.    Time  8    Period  Weeks    Status  On-going       PT LONG TERM GOAL #4   Title  Decrease edema to within 2 cms of contralateral side to assist with range of motion gains and decrease pain.    Time  8    Period  Weeks    Status  On-going      PT LONG TERM GOAL #5   Title  Perform a reciprocating stair gait with one railing with pain not > 2-3/10.    Time  8    Period  Weeks    Status  On-going            Plan - 09/27/17 9629    Clinical Impression Statement  Patient tolerated today's treatment well with minimal R knee pain. Patient progressed with ROM and weightbearing/resisted exercises without complaint. Patient still presents in clinic with R TED hose donned. No compensatory strategies noted with forward step ups. AROM of R knee improved to 5-100 deg in supine. Normal modalities response noted following removal of the modalities.    Rehab Potential  Excellent    Clinical Impairments Affecting Rehab Potential  surgery 08/23/17 4 weeks 09/20/17    PT Frequency  2x / week    PT Duration  8 weeks    PT Treatment/Interventions  ADLs/Self Care Home Management;Cryotherapy;Electrical Stimulation;Stair training;Functional mobility training;Therapeutic activities;Therapeutic exercise;Neuromuscular re-education;Patient/family education;Manual techniques;Passive range of motion;Vasopneumatic Device    PT Next Visit Plan  continue with stationary bike to improve flexion ROM with no hip compensation and progress strengthening (F/U with MD. Noemi Chapel 10/05/17)    Consulted and Agree with Plan of Care  Patient       Patient will benefit from skilled therapeutic intervention in order to improve the following deficits and impairments:  Abnormal gait, Decreased activity tolerance, Decreased range of motion, Decreased strength, Increased edema, Pain  Visit Diagnosis: Chronic pain of right knee  Localized edema  Stiffness of right knee, not elsewhere classified     Problem List Patient Active Problem List   Diagnosis Date Noted  . Primary  localized osteoarthritis of right knee 08/23/2017  . Hypothyroidism   . Hypertension   . Special screening for malignant neoplasms, colon     Standley Brooking, PTA 09/27/2017, 8:32 AM  [facility] [place], [place], [address] Phone: [phone]   Fax:  [phone]  Name: Laurie Mckenzie MRN: [id number] Date of Birth: 1950-11-14

## 2017-09-30 ENCOUNTER — Encounter: Payer: Self-pay | Admitting: Physical Therapy

## 2017-09-30 ENCOUNTER — Ambulatory Visit: Payer: Medicare Other | Admitting: Physical Therapy

## 2017-09-30 DIAGNOSIS — M25661 Stiffness of right knee, not elsewhere classified: Secondary | ICD-10-CM

## 2017-09-30 DIAGNOSIS — M25561 Pain in right knee: Secondary | ICD-10-CM | POA: Diagnosis not present

## 2017-09-30 DIAGNOSIS — G8929 Other chronic pain: Secondary | ICD-10-CM

## 2017-09-30 DIAGNOSIS — R600 Localized edema: Secondary | ICD-10-CM

## 2017-09-30 NOTE — Therapy (Signed)
[facility] [place], [place], [address] Phone: [phone]   Fax:  [phone]  Physical Therapy Treatment  Patient Details  Name: Laurie Mckenzie MRN: [id number] Date of Birth: 1951-04-26 Referring Provider: Elsie Saas MD   Encounter Date: 09/30/2017  PT End of Session - 09/30/17 0804    Visit Number  8    Number of Visits  16    Date for PT Re-Evaluation  12/06/17    Authorization Type  FOTO AT LEAST EVERY 5TH VISIT.    PT Start Time  0729    PT Stop Time  0826    PT Time Calculation (min)  57 min    Activity Tolerance  Patient tolerated treatment well    Behavior During Therapy  [facility] for tasks assessed/performed       Past Medical History:  Diagnosis Date  . Headache   . Hypertension    186/106 today    195/108 07/27/2017  . Hypothyroidism   . Primary osteoarthritis of knee    Right    Past Surgical History:  Procedure Laterality Date  . COLONOSCOPY N/A 01/16/2015   Procedure: COLONOSCOPY;  Surgeon: Daneil Dolin, MD;  [facility];  Service: Endoscopy;  Laterality: N/A;  8:30 AM  . KNEE ARTHROPLASTY Left 2011   [place]  . TOTAL KNEE ARTHROPLASTY Right 08/23/2017   Procedure: TOTAL KNEE ARTHROPLASTY;  Surgeon: Elsie Saas, MD;  Location: [place];  Service: Orthopedics;  Laterality: Right;    There were no vitals filed for this visit.  Subjective Assessment - 09/30/17 0736    Subjective  Patient reported doing good today and after last treatment    Pertinent History  Left hemi-knee.  HTN.    How long can you walk comfortably?  Short community distances    Patient Stated Goals  Get back to normal.    Currently in Pain?  Yes    Pain Score  2     Pain Location  Knee    Pain Orientation  Right;Medial    Pain Descriptors / Indicators  Discomfort    Pain Type  Surgical pain    Pain Onset  1 to 4 weeks ago    Pain Frequency  Intermittent    Aggravating Factors   increased activity    Pain  Relieving Factors  at rest         [facility] PT Assessment - 09/30/17 0001      AROM   AROM Assessment Site  Knee    Right/Left Knee  Right    Right Knee Extension  -5    Right Knee Flexion  100      PROM   PROM Assessment Site  Knee    Right/Left Knee  Right    Right Knee Extension  0    Right Knee Flexion  107                   [facility] Adult PT Treatment/Exercise - 09/30/17 0001      Knee/Hip Exercises: Aerobic   Stationary Bike  35min L2      Knee/Hip Exercises: Machines for Strengthening   Cybex Knee Extension  10# 3x10    Cybex Knee Flexion  20# 3x10      Knee/Hip Exercises: Standing   Rocker Board  2 minutes      Electrical Stimulation   Electrical Stimulation Location  R knee    Electrical Stimulation Action  IFC  Electrical Stimulation Parameters  1-10hz  x25min    Electrical Stimulation Goals  Pain;Edema      Vasopneumatic   Number Minutes Vasopneumatic   15 minutes    Vasopnuematic Location   Knee    Vasopneumatic Pressure  Medium      Manual Therapy   Manual Therapy  Passive ROM    Passive ROM  PROM of R knee into flexion, extension with holds at end range                  PT Long Term Goals - 09/08/17 0850      PT LONG TERM GOAL #1   Title  Independent with a HEP.    Time  8    Period  Weeks    Status  On-going      PT LONG TERM GOAL #2   Title  Full active right knee extension in order to normalize gait.    Time  8    Period  Weeks    Status  On-going      PT LONG TERM GOAL #3   Title  Active knee flexion to 115 degrees+ so the patient can perform functional tasks and do so with pain not > 2-3/10.    Time  8    Period  Weeks    Status  On-going      PT LONG TERM GOAL #4   Title  Decrease edema to within 2 cms of contralateral side to assist with range of motion gains and decrease pain.    Time  8    Period  Weeks    Status  On-going      PT LONG TERM GOAL #5   Title  Perform a reciprocating stair gait with one  railing with pain not > 2-3/10.    Time  8    Period  Weeks    Status  On-going            Plan - 09/30/17 0805    Clinical Impression Statement  Patient tolerated treatment well today. Patient continues to progress with right knee strengthening activities and improving ROM for flexion and ext. Patient has some ongoing edema limitations in knee. Patient going to MD next week for F/U appt. goals ongoing at this time.     Rehab Potential  Excellent    Clinical Impairments Affecting Rehab Potential  surgery 08/23/17 5 weeks 09/27/17    PT Frequency  2x / week    PT Duration  8 weeks    PT Treatment/Interventions  ADLs/Self Care Home Management;Cryotherapy;Electrical Stimulation;Stair training;Functional mobility training;Therapeutic activities;Therapeutic exercise;Neuromuscular re-education;Patient/family education;Manual techniques;Passive range of motion;Vasopneumatic Device    PT Next Visit Plan  continue with stationary bike to improve flexion ROM with no hip compensation and progress strengthening (F/U with MD. Noemi Chapel 10/05/17)    Consulted and Agree with Plan of Care  Patient       Patient will benefit from skilled therapeutic intervention in order to improve the following deficits and impairments:  Abnormal gait, Decreased activity tolerance, Decreased range of motion, Decreased strength, Increased edema, Pain  Visit Diagnosis: Chronic pain of right knee  Localized edema  Stiffness of right knee, not elsewhere classified     Problem List Patient Active Problem List   Diagnosis Date Noted  . Primary localized osteoarthritis of right knee 08/23/2017  . Hypothyroidism   . Hypertension   . Special screening for malignant neoplasms, colon     DUNFORD, CHRISTINA P, PTA 09/30/2017,  8:[facility] [place], [place], [address] Phone: [phone]   Fax:  [phone]  Name: Laurie Mckenzie MRN: [id number] Date of  Birth: 19-Feb-1951

## 2017-10-04 ENCOUNTER — Encounter: Payer: Self-pay | Admitting: Physical Therapy

## 2017-10-04 ENCOUNTER — Ambulatory Visit: Payer: Medicare Other | Admitting: Physical Therapy

## 2017-10-04 DIAGNOSIS — G8929 Other chronic pain: Secondary | ICD-10-CM

## 2017-10-04 DIAGNOSIS — M25561 Pain in right knee: Secondary | ICD-10-CM | POA: Diagnosis not present

## 2017-10-04 DIAGNOSIS — R600 Localized edema: Secondary | ICD-10-CM

## 2017-10-04 DIAGNOSIS — M25661 Stiffness of right knee, not elsewhere classified: Secondary | ICD-10-CM

## 2017-10-04 NOTE — Therapy (Signed)
[facility] [place], [place], [address] Phone: [phone]   Fax:  [phone]  Physical Therapy Treatment  Patient Details  Name: Laurie Mckenzie MRN: [id number] Date of Birth: 21-Jun-1951 Referring Provider: Elsie Saas MD   Encounter Date: 10/04/2017  PT End of Session - 10/04/17 0939    Visit Number  9    Number of Visits  16    Date for PT Re-Evaluation  12/06/17    Authorization Type  FOTO AT LEAST EVERY 5TH VISIT.    PT Start Time  0901    PT Stop Time  0956    PT Time Calculation (min)  55 min    Activity Tolerance  Patient tolerated treatment well    Behavior During Therapy  [facility] for tasks assessed/performed       Past Medical History:  Diagnosis Date  . Headache   . Hypertension    186/106 today    195/108 07/27/2017  . Hypothyroidism   . Primary osteoarthritis of knee    Right    Past Surgical History:  Procedure Laterality Date  . COLONOSCOPY N/A 01/16/2015   Procedure: COLONOSCOPY;  Surgeon: Daneil Dolin, MD;  [facility];  Service: Endoscopy;  Laterality: N/A;  8:30 AM  . KNEE ARTHROPLASTY Left 2011   [place]  . TOTAL KNEE ARTHROPLASTY Right 08/23/2017   Procedure: TOTAL KNEE ARTHROPLASTY;  Surgeon: Elsie Saas, MD;  Location: [place];  Service: Orthopedics;  Laterality: Right;    There were no vitals filed for this visit.  Subjective Assessment - 10/04/17 0911    Subjective  Patient reported doing good today and after last treatment    Pertinent History  Left hemi-knee.  HTN.    How long can you walk comfortably?  Short community distances    Patient Stated Goals  Get back to normal.    Currently in Pain?  Yes    Pain Score  2     Pain Location  Knee    Pain Orientation  Right;Medial    Pain Descriptors / Indicators  Discomfort    Pain Type  Surgical pain    Pain Onset  1 to 4 weeks ago    Pain Frequency  Intermittent    Aggravating Factors   increased activity    Pain  Relieving Factors  at rest         [facility] PT Assessment - 10/04/17 0001      AROM   AROM Assessment Site  Knee    Right/Left Knee  Right    Right Knee Extension  -5    Right Knee Flexion  100      PROM   PROM Assessment Site  Knee    Right/Left Knee  Right    Right Knee Extension  0    Right Knee Flexion  107      Special Tests   Other special tests  3 1/2 cm edema difference                   [facility] Adult PT Treatment/Exercise - 10/04/17 0001      Knee/Hip Exercises: Aerobic   Stationary Bike  68min L2      Knee/Hip Exercises: Machines for Strengthening   Cybex Knee Extension  10# 3x10    Cybex Knee Flexion  20# 3x10      Knee/Hip Exercises: Standing   Rocker Board  2 minutes      Electrical Stimulation  Electrical Stimulation Location  R knee    Chartered certified accountant  [employer]    Electrical Stimulation Parameters  1-10hz  x11min    Electrical Stimulation Goals  Pain;Edema      Vasopneumatic   Number Minutes Vasopneumatic   15 minutes    Vasopnuematic Location   Knee    Vasopneumatic Pressure  Medium      Manual Therapy   Manual Therapy  Passive ROM    Passive ROM  PROM of R knee into flexion, extension with holds at end range                  PT Long Term Goals - 10/04/17 0944      PT LONG TERM GOAL #1   Title  Independent with a HEP.    Time  8    Period  Weeks    Status  On-going      PT LONG TERM GOAL #2   Title  Full active right knee extension in order to normalize gait.    Time  8    Period  Weeks    Status  On-going -5 degrees 10/04/17      PT LONG TERM GOAL #3   Title  Active knee flexion to 115 degrees+ so the patient can perform functional tasks and do so with pain not > 2-3/10.    Time  8    Period  Weeks    Status  On-going AROM 100 degrees 10/04/17      PT LONG TERM GOAL #4   Title  Decrease edema to within 2 cms of contralateral side to assist with range of motion gains and decrease pain.    Time  8     Period  Weeks    Status  On-going 3 1/2 cm 10/04/17      PT LONG TERM GOAL #5   Title  Perform a reciprocating stair gait with one railing with pain not > 2-3/10.    Time  8    Period  Weeks    Status  On-going            Plan - 10/04/17 0941    Clinical Impression Statement  Patient tolerated treatment well today. Patient continues to progress with right knee strengthening exercises, and progressing with ROM yet limited due to ongoing edema. Patient has reported discomfort with prolong activity or ROM in knee. Patient able to perform light ADL's with greater ease. Goals progressing yet ongoing at this time.    Rehab Potential  Excellent    Clinical Impairments Affecting Rehab Potential  surgery 08/23/17 6 weeks 10/04/17    PT Frequency  2x / week    PT Duration  8 weeks    PT Treatment/Interventions  ADLs/Self Care Home Management;Cryotherapy;Electrical Stimulation;Stair training;Functional mobility training;Therapeutic activities;Therapeutic exercise;Neuromuscular re-education;Patient/family education;Manual techniques;Passive range of motion;Vasopneumatic Device    PT Next Visit Plan  continue with stationary bike to improve flexion ROM with no hip compensation and progress strengthening (F/U with MD. Noemi Chapel 10/05/17)    Consulted and Agree with Plan of Care  Patient       Patient will benefit from skilled therapeutic intervention in order to improve the following deficits and impairments:  Abnormal gait, Decreased activity tolerance, Decreased range of motion, Decreased strength, Increased edema, Pain  Visit Diagnosis: Chronic pain of right knee  Localized edema  Stiffness of right knee, not elsewhere classified     Problem List Patient Active Problem List   Diagnosis Date  Noted  . Primary localized osteoarthritis of right knee 08/23/2017  . Hypothyroidism   . Hypertension   . Special screening for malignant neoplasms, colon     Ladean Raya, PTA 10/04/17 9:57  AM  [facility] [place], [place], [address] Phone: [phone]   Fax:  [phone]  Name: Laurie Mckenzie MRN: [id number] Date of Birth: [birth year]

## 2017-10-07 ENCOUNTER — Ambulatory Visit: Payer: Medicare Other | Admitting: Physical Therapy

## 2017-10-07 ENCOUNTER — Encounter: Payer: Self-pay | Admitting: Physical Therapy

## 2017-10-07 DIAGNOSIS — M25661 Stiffness of right knee, not elsewhere classified: Secondary | ICD-10-CM

## 2017-10-07 DIAGNOSIS — M25561 Pain in right knee: Principal | ICD-10-CM

## 2017-10-07 DIAGNOSIS — R600 Localized edema: Secondary | ICD-10-CM

## 2017-10-07 DIAGNOSIS — G8929 Other chronic pain: Secondary | ICD-10-CM

## 2017-10-07 NOTE — Therapy (Signed)
[facility] [place], [place], [address] Phone: [phone]   Fax:  [phone]  Physical Therapy Treatment  Patient Details  Name: Laurie Mckenzie MRN: [id number] Date of Birth: 28-Jan-1951 Referring Provider: Elsie Saas MD   Encounter Date: 10/07/2017  PT End of Session - 10/07/17 0755    Visit Number  10    Number of Visits  16    Date for PT Re-Evaluation  12/06/17    Authorization Type  FOTO AT LEAST EVERY 5TH VISIT.    PT Start Time  [phone]    PT Stop Time  0828    PT Time Calculation (min)  57 min    Activity Tolerance  Patient tolerated treatment well    Behavior During Therapy  [facility] for tasks assessed/performed       Past Medical History:  Diagnosis Date  . Headache   . Hypertension    186/106 today    195/108 07/27/2017  . Hypothyroidism   . Primary osteoarthritis of knee    Right    Past Surgical History:  Procedure Laterality Date  . COLONOSCOPY N/A 01/16/2015   Procedure: COLONOSCOPY;  Surgeon: Daneil Dolin, MD;  [facility];  Service: Endoscopy;  Laterality: N/A;  8:30 AM  . KNEE ARTHROPLASTY Left 2011   [place]  . TOTAL KNEE ARTHROPLASTY Right 08/23/2017   Procedure: TOTAL KNEE ARTHROPLASTY;  Surgeon: Elsie Saas, MD;  Location: [facility];  Service: Orthopedics;  Laterality: Right;    There were no vitals filed for this visit.  Subjective Assessment - 10/07/17 0754    Subjective  Reports that her knee is a little tight when she starts on the bike.    Pertinent History  Left hemi-knee.  HTN.    How long can you walk comfortably?  Short community distances    Patient Stated Goals  Get back to normal.    Currently in Pain?  Yes    Pain Score  3     Pain Location  Knee    Pain Orientation  Right;Lateral    Pain Descriptors / Indicators  Discomfort    Pain Type  Surgical pain    Pain Onset  1 to 4 weeks ago    Pain Frequency  Intermittent         [facility] PT Assessment -  10/07/17 0001      Assessment   Medical Diagnosis  Primary OA of right knee.    Onset Date/Surgical Date  08/23/17    Next MD Visit  11/16/2017      Restrictions   Weight Bearing Restrictions  No                   [facility] Adult PT Treatment/Exercise - 10/07/17 0001      Knee/Hip Exercises: Stretches   ITB Stretch  Right;3 reps;30 seconds      Knee/Hip Exercises: Aerobic   Stationary Bike  L3, seat 4 x15 min      Knee/Hip Exercises: Machines for Strengthening   Cybex Knee Extension  20# 3x10 reps    Cybex Knee Flexion  40# 3x10 reps    Cybex Leg Press  2 pl, seat 6, 2x10 reps      Knee/Hip Exercises: Standing   Forward Lunges  Right;2 sets;10 reps;3 seconds    Terminal Knee Extension  Strengthening;Right;2 sets;10 reps;Limitations    Terminal Knee Extension Limitations  Pink XTS      Modalities   Modalities  Management consultant  [employer]    Electrical Stimulation Parameters  1-10 hz x15 min    Electrical Stimulation Goals  Pain;Edema      Vasopneumatic   Number Minutes Vasopneumatic   15 minutes    Vasopnuematic Location   Knee    Vasopneumatic Pressure  Medium    Vasopneumatic Temperature   34                  PT Long Term Goals - 10/04/17 0944      PT LONG TERM GOAL #1   Title  Independent with a HEP.    Time  8    Period  Weeks    Status  On-going      PT LONG TERM GOAL #2   Title  Full active right knee extension in order to normalize gait.    Time  8    Period  Weeks    Status  On-going -5 degrees 10/04/17      PT LONG TERM GOAL #3   Title  Active knee flexion to 115 degrees+ so the patient can perform functional tasks and do so with pain not > 2-3/10.    Time  8    Period  Weeks    Status  On-going AROM 100 degrees 10/04/17      PT LONG TERM GOAL #4   Title  Decrease edema to within 2 cms of contralateral side to  assist with range of motion gains and decrease pain.    Time  8    Period  Weeks    Status  On-going 3 1/2 cm 10/04/17      PT LONG TERM GOAL #5   Title  Perform a reciprocating stair gait with one railing with pain not > 2-3/10.    Time  8    Period  Weeks    Status  On-going            Plan - 10/07/17 1610    Clinical Impression Statement  Patient tolerated progression through knee strengthening exercises well with no complaints of pain throughout treatment. ITB integrated into treatment secondary to reported tightness. Palpable tightness noted with in posterior fibers of ITB as well as lateral HS. Patient educated regarding ITB stretch technique and encouraged to complete at home. Swelling still observable in R knee compared to L knee. Normal modalities response noted following removal of the modalities.    Rehab Potential  Excellent    Clinical Impairments Affecting Rehab Potential  surgery 08/23/17 6 weeks 10/04/17    PT Frequency  2x / week    PT Duration  8 weeks    PT Treatment/Interventions  ADLs/Self Care Home Management;Cryotherapy;Electrical Stimulation;Stair training;Functional mobility training;Therapeutic activities;Therapeutic exercise;Neuromuscular re-education;Patient/family education;Manual techniques;Passive range of motion;Vasopneumatic Device    PT Next Visit Plan  continue with stationary bike to improve flexion ROM with no hip compensation and progress strengthening     Consulted and Agree with Plan of Care  Patient       Patient will benefit from skilled therapeutic intervention in order to improve the following deficits and impairments:  Abnormal gait, Decreased activity tolerance, Decreased range of motion, Decreased strength, Increased edema, Pain  Visit Diagnosis: Chronic pain of right knee  Localized edema  Stiffness of right knee, not elsewhere classified     Problem List Patient Active Problem List  Diagnosis Date Noted  . Primary localized  osteoarthritis of right knee 08/23/2017  . Hypothyroidism   . Hypertension   . Special screening for malignant neoplasms, colon     Standley Brooking, PTA 10/07/2017, 8:31 AM  [facility] [place], [place], [address] Phone: [phone]   Fax:  [phone]  Name: Laurie Mckenzie MRN: [id number] Date of Birth: April 12, 1951

## 2017-10-11 ENCOUNTER — Ambulatory Visit: Payer: Medicare Other | Admitting: Physical Therapy

## 2017-10-11 ENCOUNTER — Encounter: Payer: Self-pay | Admitting: Physical Therapy

## 2017-10-11 DIAGNOSIS — G8929 Other chronic pain: Secondary | ICD-10-CM

## 2017-10-11 DIAGNOSIS — M25561 Pain in right knee: Secondary | ICD-10-CM | POA: Diagnosis not present

## 2017-10-11 DIAGNOSIS — R600 Localized edema: Secondary | ICD-10-CM

## 2017-10-11 DIAGNOSIS — M25661 Stiffness of right knee, not elsewhere classified: Secondary | ICD-10-CM

## 2017-10-11 NOTE — Therapy (Signed)
[facility] [place], [place], [address] Phone: [phone]   Fax:  [phone]  Physical Therapy Treatment  Patient Details  Name: Laurie Mckenzie MRN: [id number] Date of Birth: 11-23-1950 Referring Provider: Elsie Saas MD   Encounter Date: 10/11/2017  PT End of Session - 10/11/17 0807    Visit Number  11    Number of Visits  16    Date for PT Re-Evaluation  12/06/17    Authorization Type  FOTO AT LEAST EVERY 5TH VISIT.    PT Start Time  0730    PT Stop Time  0827    PT Time Calculation (min)  57 min    Activity Tolerance  Patient tolerated treatment well    Behavior During Therapy  [facility] for tasks assessed/performed       Past Medical History:  Diagnosis Date  . Headache   . Hypertension    186/106 today    195/108 07/27/2017  . Hypothyroidism   . Primary osteoarthritis of knee    Right    Past Surgical History:  Procedure Laterality Date  . COLONOSCOPY N/A 01/16/2015   Procedure: COLONOSCOPY;  Surgeon: Daneil Dolin, MD;  [facility];  Service: Endoscopy;  Laterality: N/A;  8:30 AM  . KNEE ARTHROPLASTY Left 2011   [place]  . TOTAL KNEE ARTHROPLASTY Right 08/23/2017   Procedure: TOTAL KNEE ARTHROPLASTY;  Surgeon: Elsie Saas, MD;  Location: [place];  Service: Orthopedics;  Laterality: Right;    There were no vitals filed for this visit.  Subjective Assessment - 10/11/17 0731    Subjective  Patient arrived doing well with ongoing tightness in knee    Pertinent History  Left hemi-knee.  HTN.    How long can you walk comfortably?  Short community distances    Patient Stated Goals  Get back to normal.    Currently in Pain?  Yes    Pain Score  3     Pain Location  Knee    Pain Orientation  Right    Pain Descriptors / Indicators  Tightness;Discomfort    Pain Type  Surgical pain    Pain Onset  More than a month ago    Pain Frequency  Intermittent    Aggravating Factors   increased activity  or ROM for flexion     Pain Relieving Factors  at rest         [facility] PT Assessment - 10/11/17 0001      AROM   AROM Assessment Site  Knee    Right/Left Knee  Right    Right Knee Extension  -5    Right Knee Flexion  100      PROM   PROM Assessment Site  Knee    Right/Left Knee  Right    Right Knee Extension  0    Right Knee Flexion  110                   [facility] Adult PT Treatment/Exercise - 10/11/17 0001      Knee/Hip Exercises: Aerobic   Nustep  L5 x1min      Knee/Hip Exercises: Machines for Strengthening   Cybex Knee Extension  20# 4x10 reps    Cybex Knee Flexion  40# 4x10 reps    Cybex Leg Press  2 pl, seat 6, 2x10 reps      Knee/Hip Exercises: Standing   Forward Step Up  Right;20 reps;Hand Hold: 0;Step Height: 6"  Rocker Board  2 minutes      Ambulance person  [employer]    Electrical Stimulation Parameters  1-10hz  x88min    Electrical Stimulation Goals  Pain;Edema      Vasopneumatic   Number Minutes Vasopneumatic   15 minutes    Vasopnuematic Location   Knee    Vasopneumatic Pressure  Medium      Manual Therapy   Manual Therapy  Passive ROM;Soft tissue mobilization    Manual therapy comments  manual STW to Rt ITband    Passive ROM  PROM of R knee into flexion, extension with holds at end range                  PT Long Term Goals - 10/04/17 0944      PT LONG TERM GOAL #1   Title  Independent with a HEP.    Time  8    Period  Weeks    Status  On-going      PT LONG TERM GOAL #2   Title  Full active right knee extension in order to normalize gait.    Time  8    Period  Weeks    Status  On-going -5 degrees 10/04/17      PT LONG TERM GOAL #3   Title  Active knee flexion to 115 degrees+ so the patient can perform functional tasks and do so with pain not > 2-3/10.    Time  8    Period  Weeks    Status  On-going AROM 100 degrees 10/04/17      PT LONG TERM  GOAL #4   Title  Decrease edema to within 2 cms of contralateral side to assist with range of motion gains and decrease pain.    Time  8    Period  Weeks    Status  On-going 3 1/2 cm 10/04/17      PT LONG TERM GOAL #5   Title  Perform a reciprocating stair gait with one railing with pain not > 2-3/10.    Time  8    Period  Weeks    Status  On-going            Plan - 10/11/17 3016    Clinical Impression Statement  Patient tolerated treatment well today. Patient continues to improve with ROM in right knee after manual stretching yet limited by edema. Patient progressing with right knee strength progression exercises and able to perform step ups with no hand held assist and good quad control. patent improved FOTO score today 42% limitation (initial 72%) Goals progressing.     Rehab Potential  Excellent    Clinical Impairments Affecting Rehab Potential  surgery 08/23/17 7 weeks 10/11/17 FOTO 11th visit 42% limitation    PT Frequency  2x / week    PT Duration  8 weeks    PT Treatment/Interventions  ADLs/Self Care Home Management;Cryotherapy;Electrical Stimulation;Stair training;Functional mobility training;Therapeutic activities;Therapeutic exercise;Neuromuscular re-education;Patient/family education;Manual techniques;Passive range of motion;Vasopneumatic Device    PT Next Visit Plan  continue with stationary bike to improve flexion ROM with no hip compensation and progress strengthening (MD 11/16/17)    Consulted and Agree with Plan of Care  Patient       Patient will benefit from skilled therapeutic intervention in order to improve the following deficits and impairments:  Abnormal gait, Decreased activity tolerance, Decreased range of motion, Decreased strength, Increased  edema, Pain  Visit Diagnosis: Chronic pain of right knee  Localized edema  Stiffness of right knee, not elsewhere classified     Problem List Patient Active Problem List   Diagnosis Date Noted  . Primary  localized osteoarthritis of right knee 08/23/2017  . Hypothyroidism   . Hypertension   . Special screening for malignant neoplasms, colon     Anahi Belmar P, PTA  10/11/2017, 8:31 AM  [facility] [place], [place], [address] Phone: [phone]   Fax:  [phone]  Name: Ellakate Gonsalves Swiderski MRN: [id number] Date of Birth: 1950-08-02

## 2017-10-14 ENCOUNTER — Ambulatory Visit: Payer: Medicare Other | Admitting: Physical Therapy

## 2017-10-14 DIAGNOSIS — M25661 Stiffness of right knee, not elsewhere classified: Secondary | ICD-10-CM

## 2017-10-14 DIAGNOSIS — R600 Localized edema: Secondary | ICD-10-CM

## 2017-10-14 DIAGNOSIS — G8929 Other chronic pain: Secondary | ICD-10-CM

## 2017-10-14 DIAGNOSIS — M25561 Pain in right knee: Secondary | ICD-10-CM | POA: Diagnosis not present

## 2017-10-14 NOTE — Therapy (Signed)
[facility] [place], [place], [address] Phone: [phone]   Fax:  [phone]  Physical Therapy Treatment  Patient Details  Name: Laurie Mckenzie MRN: [id number] Date of Birth: 10-02-1950 Referring Provider: Elsie Saas MD   Encounter Date: 10/14/2017  PT End of Session - 10/14/17 0909    Visit Number  12    Number of Visits  16    Date for PT Re-Evaluation  12/06/17    Authorization Type  FOTO AT LEAST EVERY 5TH VISIT.    PT Start Time  0900    PT Stop Time  0954    PT Time Calculation (min)  54 min    Activity Tolerance  Patient tolerated treatment well    Behavior During Therapy  WFL for tasks assessed/performed       Past Medical History:  Diagnosis Date  . Headache   . Hypertension    186/106 today    195/108 07/27/2017  . Hypothyroidism   . Primary osteoarthritis of knee    Right    Past Surgical History:  Procedure Laterality Date  . COLONOSCOPY N/A 01/16/2015   Procedure: COLONOSCOPY;  Surgeon: Daneil Dolin, MD;  [facility];  Service: Endoscopy;  Laterality: N/A;  8:30 AM  . KNEE ARTHROPLASTY Left 2011   [place]  . TOTAL KNEE ARTHROPLASTY Right 08/23/2017   Procedure: TOTAL KNEE ARTHROPLASTY;  Surgeon: Elsie Saas, MD;  Location: [place];  Service: Orthopedics;  Laterality: Right;    There were no vitals filed for this visit.      [facility] PT Assessment - 10/14/17 0001      Assessment   Medical Diagnosis  Primary OA of right knee.    Next MD Visit  11/16/2017                   [facility] Adult PT Treatment/Exercise - 10/14/17 0001      Knee/Hip Exercises: Stretches   Knee: Self-Stretch to increase Flexion  --      Knee/Hip Exercises: Aerobic   Stationary Bike  level 3, seat 4 x15 minutes      Knee/Hip Exercises: Machines for Strengthening   Cybex Leg Press  2 pl, seat 6, 2x10 reps      Knee/Hip Exercises: Standing   Hip Abduction  AROM;Both;2 sets;10 reps;Knee  straight    Abduction Limitations  2#    Step Down  Right;2 sets;10 reps;Hand Hold: 2;Step Height: 4"    Rocker Board  2 minutes      Electrical Stimulation   Electrical Stimulation Location  R knee    Electrical Stimulation Action  IFC    Electrical Stimulation Parameters  1-10 hz x15 min    Electrical Stimulation Goals  Pain;Edema      Vasopneumatic   Number Minutes Vasopneumatic   15 minutes    Vasopnuematic Location   Knee    Vasopneumatic Pressure  Medium      Manual Therapy   Manual Therapy  Passive ROM;Soft tissue mobilization    Joint Mobilization  inf/sup R patella mobs to improve ROM    Passive ROM  PROM of R knee into flexion, extension with holds at end range                  PT Long Term Goals - 10/04/17 0944      PT LONG TERM GOAL #1   Title  Independent with a HEP.    Time  8  Period  Weeks    Status  On-going      PT LONG TERM GOAL #2   Title  Full active right knee extension in order to normalize gait.    Time  8    Period  Weeks    Status  On-going -5 degrees 10/04/17      PT LONG TERM GOAL #3   Title  Active knee flexion to 115 degrees+ so the patient can perform functional tasks and do so with pain not > 2-3/10.    Time  8    Period  Weeks    Status  On-going AROM 100 degrees 10/04/17      PT LONG TERM GOAL #4   Title  Decrease edema to within 2 cms of contralateral side to assist with range of motion gains and decrease pain.    Time  8    Period  Weeks    Status  On-going 3 1/2 cm 10/04/17      PT LONG TERM GOAL #5   Title  Perform a reciprocating stair gait with one railing with pain not > 2-3/10.    Time  8    Period  Weeks    Status  On-going            Plan - 10/14/17 1113    Clinical Impression Statement  Patient was able to tolerate treatment with minimal reports of pain during TEs. Patient noted with muscle fatigue with hip abduction but patient was able to complete set with rest. Patient noted with increased muscle  "tightness" with knee flexion PROM. Normal response to modaltites upon removal.     Clinical Presentation  Stable    Clinical Decision Making  Low    Rehab Potential  Excellent    Clinical Impairments Affecting Rehab Potential  surgery 08/23/17 7 weeks 10/11/17 FOTO 11th visit 42% limitation    PT Frequency  2x / week    PT Duration  8 weeks    PT Treatment/Interventions  Cryotherapy;Electrical Stimulation;Stair training;Functional mobility training;Therapeutic activities;Therapeutic exercise;Neuromuscular re-education;Patient/family education;Manual techniques;Passive range of motion;Vasopneumatic Device    PT Next Visit Plan  continue with stationary bike to improve flexion ROM with no hip compensation and progress strengthening (MD 11/16/17)    Consulted and Agree with Plan of Care  Patient       Patient will benefit from skilled therapeutic intervention in order to improve the following deficits and impairments:  Abnormal gait, Decreased activity tolerance, Decreased range of motion, Decreased strength, Increased edema, Pain  Visit Diagnosis: Chronic pain of right knee  Localized edema  Stiffness of right knee, not elsewhere classified     Problem List Patient Active Problem List   Diagnosis Date Noted  . Primary localized osteoarthritis of right knee 08/23/2017  . Hypothyroidism   . Hypertension   . Special screening for malignant neoplasms, colon    Gabriela Eves, PT, DPT 10/14/2017, 11:28 AM  [facility] [address] [place], [place], [address] Phone: [phone]   Fax:  [phone]  Name: Laurie Mckenzie MRN: [id number] Date of Birth: 12-Jul-1950

## 2017-10-19 ENCOUNTER — Ambulatory Visit: Payer: Medicare Other | Admitting: Physical Therapy

## 2017-10-19 ENCOUNTER — Encounter: Payer: Self-pay | Admitting: Physical Therapy

## 2017-10-19 DIAGNOSIS — R600 Localized edema: Secondary | ICD-10-CM

## 2017-10-19 DIAGNOSIS — M25661 Stiffness of right knee, not elsewhere classified: Secondary | ICD-10-CM

## 2017-10-19 DIAGNOSIS — M25561 Pain in right knee: Principal | ICD-10-CM

## 2017-10-19 DIAGNOSIS — G8929 Other chronic pain: Secondary | ICD-10-CM

## 2017-10-19 NOTE — Therapy (Signed)
[facility] [place], [place], [address] Phone: [phone]   Fax:  [phone]  Physical Therapy Treatment  Patient Details  Name: Laurie Mckenzie MRN: [id number] Date of Birth: February 14, 1951 Referring Provider: Elsie Saas MD   Encounter Date: 10/19/2017  PT End of Session - 10/19/17 0746    Visit Number  13    Number of Visits  16    Date for PT Re-Evaluation  12/06/17    Authorization Type  FOTO AT LEAST EVERY 5TH VISIT.    PT Start Time  [phone]    PT Stop Time  0826    PT Time Calculation (min)  52 min    Activity Tolerance  Patient tolerated treatment well    Behavior During Therapy  [facility] for tasks assessed/performed       Past Medical History:  Diagnosis Date  . Headache   . Hypertension    186/106 today    195/108 07/27/2017  . Hypothyroidism   . Primary osteoarthritis of knee    Right    Past Surgical History:  Procedure Laterality Date  . COLONOSCOPY N/A 01/16/2015   Procedure: COLONOSCOPY;  Surgeon: Daneil Dolin, MD;  [facility];  Service: Endoscopy;  Laterality: N/A;  8:30 AM  . KNEE ARTHROPLASTY Left 2011   [place]  . TOTAL KNEE ARTHROPLASTY Right 08/23/2017   Procedure: TOTAL KNEE ARTHROPLASTY;  Surgeon: Elsie Saas, MD;  Location: [place];  Service: Orthopedics;  Laterality: Right;    There were no vitals filed for this visit.  Subjective Assessment - 10/19/17 0745    Subjective  Reports that she mowed her yard yesterday on riding lawnmower. Reports that knee did well riding the lawnmower with the usual stiffness initially. Reports trying stairs but doesn't necessarily have to go up and down stairs daily.    Pertinent History  Left hemi-knee.  HTN.    How long can you walk comfortably?  Short community distances    Patient Stated Goals  Get back to normal.    Currently in Pain?  No/denies         [facility] PT Assessment - 10/19/17 0001      Assessment   Medical Diagnosis   Primary OA of right knee.    Next MD Visit  11/16/2017                   [facility] Adult PT Treatment/Exercise - 10/19/17 0001      Ambulation/Gait   Stairs  Yes    Stairs Assistance  6: Modified independent (Device/Increase time) Mod independent but patient limited by fear of falling    Stair Management Technique  One rail Left;Alternating pattern;Forwards    Number of Stairs  4    Height of Stairs  6.5      Knee/Hip Exercises: Aerobic   Stationary Bike  level 3, seat 4 x18 minutes      Knee/Hip Exercises: Machines for Strengthening   Cybex Knee Extension  20# 3x10 reps    Cybex Knee Flexion  40# 3x10 reps     Cybex Leg Press  2 pl, seat 7 x30 reps with ball squeeze      Knee/Hip Exercises: Standing   Step Down  Right;15 reps;Hand Hold: 2;Step Height: 4" Heel dot      Knee/Hip Exercises: Supine   Straight Leg Raise with External Rotation  AROM;Right;15 reps      Modalities   Modalities  Vasopneumatic  Vasopneumatic   Number Minutes Vasopneumatic   15 minutes    Vasopnuematic Location   Knee    Vasopneumatic Pressure  Medium    Vasopneumatic Temperature   67                  PT Long Term Goals - 10/04/17 0944      PT LONG TERM GOAL #1   Title  Independent with a HEP.    Time  8    Period  Weeks    Status  On-going      PT LONG TERM GOAL #2   Title  Full active right knee extension in order to normalize gait.    Time  8    Period  Weeks    Status  On-going -5 degrees 10/04/17      PT LONG TERM GOAL #3   Title  Active knee flexion to 115 degrees+ so the patient can perform functional tasks and do so with pain not > 2-3/10.    Time  8    Period  Weeks    Status  On-going AROM 100 degrees 10/04/17      PT LONG TERM GOAL #4   Title  Decrease edema to within 2 cms of contralateral side to assist with range of motion gains and decrease pain.    Time  8    Period  Weeks    Status  On-going 3 1/2 cm 10/04/17      PT LONG TERM GOAL #5   Title   Perform a reciprocating stair gait with one railing with pain not > 2-3/10.    Time  8    Period  Weeks    Status  On-going            Plan - 10/19/17 0857    Clinical Impression Statement  Patient tolerated today's treatment well although she demonstrated fear of falling and fear of buckling with RLE. Patient able to demonstrate good strength with machine exercises but fear and lack of confidence was greatest limitation with stair ambulation. Patient reports that she usually descends leading with LLE due to fear. Advanced quad and VMO strengthening completed as to fine tune quad strengthening. Normal vasopnuematic response noted following removal of the modalities.    Rehab Potential  Excellent    Clinical Impairments Affecting Rehab Potential  surgery 08/23/17 7 weeks 10/11/17 FOTO 11th visit 42% limitation    PT Frequency  2x / week    PT Duration  8 weeks    PT Treatment/Interventions  Cryotherapy;Electrical Stimulation;Stair training;Functional mobility training;Therapeutic activities;Therapeutic exercise;Neuromuscular re-education;Patient/family education;Manual techniques;Passive range of motion;Vasopneumatic Device    PT Next Visit Plan  continue with stationary bike to improve flexion ROM with no hip compensation and progress strengthening (MD 11/16/17)    Consulted and Agree with Plan of Care  Patient       Patient will benefit from skilled therapeutic intervention in order to improve the following deficits and impairments:  Abnormal gait, Decreased activity tolerance, Decreased range of motion, Decreased strength, Increased edema, Pain  Visit Diagnosis: Chronic pain of right knee  Localized edema  Stiffness of right knee, not elsewhere classified     Problem List Patient Active Problem List   Diagnosis Date Noted  . Primary localized osteoarthritis of right knee 08/23/2017  . Hypothyroidism   . Hypertension   . Special screening for malignant neoplasms, colon      Standley Brooking, PTA 10/19/2017, 9:02 AM  [facility] [place], [place], [address] Phone: [phone]   Fax:  [phone]  Name: Laurie Mckenzie MRN: [id number] Date of Birth: October 14, 1950

## 2017-10-21 ENCOUNTER — Encounter: Payer: Self-pay | Admitting: Physical Therapy

## 2017-10-21 ENCOUNTER — Ambulatory Visit: Payer: Medicare Other | Admitting: Physical Therapy

## 2017-10-21 DIAGNOSIS — G8929 Other chronic pain: Secondary | ICD-10-CM

## 2017-10-21 DIAGNOSIS — M25561 Pain in right knee: Principal | ICD-10-CM

## 2017-10-21 DIAGNOSIS — R600 Localized edema: Secondary | ICD-10-CM

## 2017-10-21 DIAGNOSIS — M25661 Stiffness of right knee, not elsewhere classified: Secondary | ICD-10-CM

## 2017-10-21 NOTE — Therapy (Signed)
[facility] [place], [place], [address] Phone: [phone]   Fax:  [phone]  Physical Therapy Treatment  Patient Details  Name: Laurie Mckenzie MRN: [id number] Date of Birth: 11/28/1950 Referring Provider: Elsie Saas MD   Encounter Date: 10/21/2017  PT End of Session - 10/21/17 0744    Visit Number  14    Number of Visits  16    Date for PT Re-Evaluation  12/06/17    Authorization Type  FOTO AT LEAST EVERY 5TH VISIT.    PT Start Time  0732    PT Stop Time  0823    PT Time Calculation (min)  51 min    Activity Tolerance  Patient tolerated treatment well    Behavior During Therapy  [facility] for tasks assessed/performed       Past Medical History:  Diagnosis Date  . Headache   . Hypertension    186/106 today    195/108 07/27/2017  . Hypothyroidism   . Primary osteoarthritis of knee    Right    Past Surgical History:  Procedure Laterality Date  . COLONOSCOPY N/A 01/16/2015   Procedure: COLONOSCOPY;  Surgeon: Daneil Dolin, MD;  [facility];  Service: Endoscopy;  Laterality: N/A;  8:30 AM  . KNEE ARTHROPLASTY Left 2011   [place]  . TOTAL KNEE ARTHROPLASTY Right 08/23/2017   Procedure: TOTAL KNEE ARTHROPLASTY;  Surgeon: Elsie Saas, MD;  Location: [place];  Service: Orthopedics;  Laterality: Right;    There were no vitals filed for this visit.  Subjective Assessment - 10/21/17 0743    Subjective  Reports discomfort in lateral R knee. Was dismantling her bed prior to treatment today     Pertinent History  Left hemi-knee.  HTN.    How long can you walk comfortably?  Short community distances    Patient Stated Goals  Get back to normal.    Currently in Pain?  Yes    Pain Score  -- No pain score provided    Pain Location  Knee    Pain Orientation  Right;Lateral    Pain Descriptors / Indicators  Discomfort    Pain Type  Surgical pain    Pain Onset  More than a month ago         [facility] PT  Assessment - 10/21/17 0001      Assessment   Medical Diagnosis  Primary OA of right knee.    Next MD Visit  11/16/2017                   [facility] Adult PT Treatment/Exercise - 10/21/17 0001      Knee/Hip Exercises: Aerobic   Stationary Bike  L3, seat 6 x12 min      Knee/Hip Exercises: Machines for Strengthening   Cybex Knee Extension  20# 3x10 reps    Cybex Knee Flexion  40# 3x10 reps       Knee/Hip Exercises: Standing   Step Down  Right;2 sets;10 reps;Hand Hold: 2;Step Height: 4" Heel dot 4# x20 reps    Rocker Board  3 minutes      Modalities   Modalities  Electrical Stimulation;Vasopneumatic      Acupuncturist Location  R knee    Electrical Stimulation Action  IFC    Electrical Stimulation Parameters  1-10 hz x15 min    Electrical Stimulation Goals  Pain      Vasopneumatic   Number Minutes Vasopneumatic  15 minutes    Vasopnuematic Location   Knee    Vasopneumatic Pressure  Medium    Vasopneumatic Temperature   34      Manual Therapy   Manual Therapy  Myofascial release    Myofascial Release  IASTW to R lateral calf and distal ITB                  PT Long Term Goals - 10/04/17 0944      PT LONG TERM GOAL #1   Title  Independent with a HEP.    Time  8    Period  Weeks    Status  On-going      PT LONG TERM GOAL #2   Title  Full active right knee extension in order to normalize gait.    Time  8    Period  Weeks    Status  On-going -5 degrees 10/04/17      PT LONG TERM GOAL #3   Title  Active knee flexion to 115 degrees+ so the patient can perform functional tasks and do so with pain not > 2-3/10.    Time  8    Period  Weeks    Status  On-going AROM 100 degrees 10/04/17      PT LONG TERM GOAL #4   Title  Decrease edema to within 2 cms of contralateral side to assist with range of motion gains and decrease pain.    Time  8    Period  Weeks    Status  On-going 3 1/2 cm 10/04/17      PT LONG TERM GOAL #5    Title  Perform a reciprocating stair gait with one railing with pain not > 2-3/10.    Time  8    Period  Weeks    Status  On-going            Plan - 10/21/17 5397    Clinical Impression Statement  Patient tolerated today's treatment well although she reported more lateral ITB and calf discomfort today. Patient able to tolerate the step down and heel dot exercises today as she had UE support which helped ease her fear of falling. Rockerboard utilized today due to the calf discomfort. Good redness response elicited with IASTW to L distal ITB and lateral calf. Patient educated regarding rationale for IASTW as well as that soreness may present in treated musculature. Normal modalities response noted following removal of the modalities.    Rehab Potential  Excellent    Clinical Impairments Affecting Rehab Potential  surgery 08/23/17 7 weeks 10/11/17 FOTO 11th visit 42% limitation    PT Frequency  2x / week    PT Duration  8 weeks    PT Treatment/Interventions  Cryotherapy;Electrical Stimulation;Stair training;Functional mobility training;Therapeutic activities;Therapeutic exercise;Neuromuscular re-education;Patient/family education;Manual techniques;Passive range of motion;Vasopneumatic Device    PT Next Visit Plan  Continue with more functional and advanced quad strengthening to assist with improving stair gait.    Consulted and Agree with Plan of Care  Patient       Patient will benefit from skilled therapeutic intervention in order to improve the following deficits and impairments:  Abnormal gait, Decreased activity tolerance, Decreased range of motion, Decreased strength, Increased edema, Pain  Visit Diagnosis: Chronic pain of right knee  Localized edema  Stiffness of right knee, not elsewhere classified     Problem List Patient Active Problem List   Diagnosis Date Noted  . Primary localized osteoarthritis of right knee 08/23/2017  .  Hypothyroidism   . Hypertension   . Special  screening for malignant neoplasms, colon     Standley Brooking, PTA 10/21/17 8:30 AM   [facility] [place], [place], [address] Phone: [phone]   Fax:  [phone]  Name: Laurie Mckenzie MRN: [id number] Date of Birth: 05/14/1951

## 2017-10-25 ENCOUNTER — Encounter: Payer: Self-pay | Admitting: Physical Therapy

## 2017-10-25 ENCOUNTER — Ambulatory Visit: Payer: Medicare Other | Admitting: Physical Therapy

## 2017-10-25 DIAGNOSIS — M25561 Pain in right knee: Principal | ICD-10-CM

## 2017-10-25 DIAGNOSIS — R600 Localized edema: Secondary | ICD-10-CM

## 2017-10-25 DIAGNOSIS — M25661 Stiffness of right knee, not elsewhere classified: Secondary | ICD-10-CM

## 2017-10-25 DIAGNOSIS — G8929 Other chronic pain: Secondary | ICD-10-CM

## 2017-10-25 NOTE — Therapy (Signed)
[facility] [place], [place], [address] Phone: [phone]   Fax:  [phone]  Physical Therapy Treatment  Patient Details  Name: Laurie Mckenzie MRN: [id number] Date of Birth: [birth year] Referring Provider: Elsie Saas MD   Encounter Date: 10/25/2017  PT End of Session - 10/25/17 0810    Visit Number  15    Number of Visits  16    Date for PT Re-Evaluation  12/06/17    Authorization Type  FOTO AT LEAST EVERY 5TH VISIT.    PT Start Time  0730    PT Stop Time  0827    PT Time Calculation (min)  57 min    Activity Tolerance  Patient tolerated treatment well    Behavior During Therapy  WFL for tasks assessed/performed       Past Medical History:  Diagnosis Date  . Headache   . Hypertension    186/106 today    195/108 07/27/2017  . Hypothyroidism   . Primary osteoarthritis of knee    Right    Past Surgical History:  Procedure Laterality Date  . COLONOSCOPY N/A 01/16/2015   Procedure: COLONOSCOPY;  Surgeon: Daneil Dolin, MD;  [facility];  Service: Endoscopy;  Laterality: N/A;  8:30 AM  . KNEE ARTHROPLASTY Left 2011   [place]  . TOTAL KNEE ARTHROPLASTY Right 08/23/2017   Procedure: TOTAL KNEE ARTHROPLASTY;  Surgeon: Elsie Saas, MD;  Location: [place];  Service: Orthopedics;  Laterality: Right;    There were no vitals filed for this visit.  Subjective Assessment - 10/25/17 0743    Subjective  Doing good upon arrival per reported    Pertinent History  Left hemi-knee.  HTN.    How long can you walk comfortably?  Short community distances    Patient Stated Goals  Get back to normal.    Currently in Pain?  Yes    Pain Score  1     Pain Location  Knee    Pain Orientation  Right;Lateral    Pain Descriptors / Indicators  Discomfort    Pain Type  Surgical pain    Pain Onset  More than a month ago    Pain Frequency  Intermittent    Aggravating Factors   ROM    Pain Relieving Factors  rest          [facility] PT Assessment - 10/25/17 0001      AROM   AROM Assessment Site  Knee    Right/Left Knee  Right    Right Knee Extension  -5    Right Knee Flexion  95      PROM   PROM Assessment Site  Knee    Right/Left Knee  Right    Right Knee Extension  0    Right Knee Flexion  105                   [facility] Adult PT Treatment/Exercise - 10/25/17 0001      Knee/Hip Exercises: Aerobic   Stationary Bike  L3, seat 5 x15 min      Knee/Hip Exercises: Machines for Strengthening   Cybex Knee Extension  20# 4x10 reps    Cybex Knee Flexion  40# 4x10 reps       Knee/Hip Exercises: Standing   Step Down  Right;2 sets;10 reps;Hand Hold: 2;Step Height: 4"    Rocker Board  2 minutes      Acupuncturist  Location  R knee    Electrical Stimulation Action  IFC    Electrical Stimulation Parameters  1-10hz  x52min    Electrical Stimulation Goals  Pain      Vasopneumatic   Number Minutes Vasopneumatic   15 minutes    Vasopnuematic Location   Knee    Vasopneumatic Pressure  Medium      Manual Therapy   Manual Therapy  Passive ROM    Passive ROM  PROM of R knee into flexion, extension with holds at end range                  PT Long Term Goals - 10/04/17 0944      PT LONG TERM GOAL #1   Title  Independent with a HEP.    Time  8    Period  Weeks    Status  On-going      PT LONG TERM GOAL #2   Title  Full active right knee extension in order to normalize gait.    Time  8    Period  Weeks    Status  On-going -5 degrees 10/04/17      PT LONG TERM GOAL #3   Title  Active knee flexion to 115 degrees+ so the patient can perform functional tasks and do so with pain not > 2-3/10.    Time  8    Period  Weeks    Status  On-going AROM 100 degrees 10/04/17      PT LONG TERM GOAL #4   Title  Decrease edema to within 2 cms of contralateral side to assist with range of motion gains and decrease pain.    Time  8    Period  Weeks    Status   On-going 3 1/2 cm 10/04/17      PT LONG TERM GOAL #5   Title  Perform a reciprocating stair gait with one railing with pain not > 2-3/10.    Time  8    Period  Weeks    Status  On-going            Plan - 10/25/17 1638    Clinical Impression Statement  Patient tolerated treatment well with no increased pain reported with exercises yet some increased discomfort lateral knee with stretching. Patient progressing with strengthening activities yet no improvement with ROM. Educated patient on daily self stretches to improve ROM. Goals ongoing at this time.     Rehab Potential  Excellent    Clinical Impairments Affecting Rehab Potential  surgery 08/23/17 7 weeks 10/11/17 FOTO 11th visit 42% limitation    PT Frequency  2x / week    PT Duration  8 weeks    PT Treatment/Interventions  Cryotherapy;Electrical Stimulation;Stair training;Functional mobility training;Therapeutic activities;Therapeutic exercise;Neuromuscular re-education;Patient/family education;Manual techniques;Passive range of motion;Vasopneumatic Device    PT Next Visit Plan  Continue with more functional and advanced quad strengthening to assist with improving stair gait. (MD F/U 11/16/17)    Consulted and Agree with Plan of Care  Patient       Patient will benefit from skilled therapeutic intervention in order to improve the following deficits and impairments:  Abnormal gait, Decreased activity tolerance, Decreased range of motion, Decreased strength, Increased edema, Pain  Visit Diagnosis: Chronic pain of right knee  Localized edema  Stiffness of right knee, not elsewhere classified     Problem List Patient Active Problem List   Diagnosis Date Noted  . Primary localized osteoarthritis of right knee 08/23/2017  .  Hypothyroidism   . Hypertension   . Special screening for malignant neoplasms, colon     Brandy Kabat P, PTA 10/25/2017, 8:30 AM  [facility] [place], [place], [address] Phone: [phone]   Fax:  [phone]  Name: Brisa Auth Swenor MRN: [id number] Date of Birth: December 10, 1950

## 2017-10-27 ENCOUNTER — Encounter: Payer: Self-pay | Admitting: Physical Therapy

## 2017-10-27 ENCOUNTER — Ambulatory Visit: Payer: Medicare Other | Attending: Physician Assistant | Admitting: Physical Therapy

## 2017-10-27 DIAGNOSIS — G8929 Other chronic pain: Secondary | ICD-10-CM | POA: Diagnosis present

## 2017-10-27 DIAGNOSIS — M25561 Pain in right knee: Secondary | ICD-10-CM | POA: Insufficient documentation

## 2017-10-27 DIAGNOSIS — M25661 Stiffness of right knee, not elsewhere classified: Secondary | ICD-10-CM | POA: Diagnosis present

## 2017-10-27 DIAGNOSIS — R600 Localized edema: Secondary | ICD-10-CM | POA: Diagnosis present

## 2017-10-27 NOTE — Therapy (Signed)
[facility] [place], [place], [address] Phone: [phone]   Fax:  [phone]  Physical Therapy Treatment  Patient Details  Name: Laurie Mckenzie MRN: [id number] Date of Birth: 1950/11/28 Referring Provider: Elsie Saas MD   Encounter Date: 10/27/2017  PT End of Session - 10/27/17 0749    Visit Number  17    Number of Visits  24    Date for PT Re-Evaluation  12/06/17    Authorization Type  FOTO AT LEAST EVERY 5TH VISIT.    PT Start Time  0730    PT Stop Time  0826    PT Time Calculation (min)  56 min    Activity Tolerance  Patient tolerated treatment well    Behavior During Therapy  WFL for tasks assessed/performed       Past Medical History:  Diagnosis Date  . Headache   . Hypertension    186/106 today    195/108 07/27/2017  . Hypothyroidism   . Primary osteoarthritis of knee    Right    Past Surgical History:  Procedure Laterality Date  . COLONOSCOPY N/A 01/16/2015   Procedure: COLONOSCOPY;  Surgeon: Daneil Dolin, MD;  [facility];  Service: Endoscopy;  Laterality: N/A;  8:30 AM  . KNEE ARTHROPLASTY Left 2011   [place]  . TOTAL KNEE ARTHROPLASTY Right 08/23/2017   Procedure: TOTAL KNEE ARTHROPLASTY;  Surgeon: Elsie Saas, MD;  Location: [place];  Service: Orthopedics;  Laterality: Right;    There were no vitals filed for this visit.  Subjective Assessment - 10/27/17 0736    Subjective  Doing good upon arrival per reported    Pertinent History  Left hemi-knee.  HTN.    How long can you walk comfortably?  Short community distances    Patient Stated Goals  Get back to normal.    Currently in Pain?  Yes    Pain Score  1     Pain Location  Knee    Pain Orientation  Right;Lateral    Pain Descriptors / Indicators  Discomfort    Pain Type  Surgical pain    Pain Onset  More than a month ago    Pain Frequency  Intermittent    Aggravating Factors   ROM    Pain Relieving Factors  rest          [facility] PT Assessment - 10/27/17 0001      AROM   AROM Assessment Site  Knee    Right/Left Knee  Right    Right Knee Extension  -4    Right Knee Flexion  97      PROM   PROM Assessment Site  Knee    Right/Left Knee  Right    Right Knee Extension  0    Right Knee Flexion  108                   [facility] Adult PT Treatment/Exercise - 10/27/17 0001      Knee/Hip Exercises: Aerobic   Stationary Bike  L3, seat 5 x15 min      Knee/Hip Exercises: Machines for Strengthening   Cybex Knee Extension  20# 4x10 reps    Cybex Knee Flexion  40# 4x10 reps     Cybex Leg Press  2 pl, seat 7 x30 reps with ball squeeze      Knee/Hip Exercises: Standing   Step Down  Right;2 sets;10 reps;Hand Hold: 2;Step Height: 6"  Rocker Board  2 minutes      Ambulance person  IFC    Electrical Stimulation Parameters  1-10hz  x13min    Electrical Stimulation Goals  Pain      Vasopneumatic   Number Minutes Vasopneumatic   15 minutes    Vasopnuematic Location   Knee    Vasopneumatic Pressure  Medium      Manual Therapy   Manual Therapy  Passive ROM    Passive ROM  PROM of R knee into flexion, extension with holds at end range                  PT Long Term Goals - 10/04/17 0944      PT LONG TERM GOAL #1   Title  Independent with a HEP.    Time  8    Period  Weeks    Status  On-going      PT LONG TERM GOAL #2   Title  Full active right knee extension in order to normalize gait.    Time  8    Period  Weeks    Status  On-going -5 degrees 10/04/17      PT LONG TERM GOAL #3   Title  Active knee flexion to 115 degrees+ so the patient can perform functional tasks and do so with pain not > 2-3/10.    Time  8    Period  Weeks    Status  On-going AROM 100 degrees 10/04/17      PT LONG TERM GOAL #4   Title  Decrease edema to within 2 cms of contralateral side to assist with range of motion gains  and decrease pain.    Time  8    Period  Weeks    Status  On-going 3 1/2 cm 10/04/17      PT LONG TERM GOAL #5   Title  Perform a reciprocating stair gait with one railing with pain not > 2-3/10.    Time  8    Period  Weeks    Status  On-going            Plan - 10/27/17 0813    Clinical Impression Statement  Patient tolerated treatment well today. Patient progressing with right knee strengthening activities and has improved ROM today for both flexion and ext. Patient was educated on prone hang today to help improve ext ROM. Goals progressing.     Rehab Potential  Excellent    Clinical Impairments Affecting Rehab Potential  surgery 08/23/17 7 weeks 10/11/17 FOTO 17th visit 24% limitation    PT Frequency  2x / week    PT Duration  8 weeks    PT Treatment/Interventions  Cryotherapy;Electrical Stimulation;Stair training;Functional mobility training;Therapeutic activities;Therapeutic exercise;Neuromuscular re-education;Patient/family education;Manual techniques;Passive range of motion;Vasopneumatic Device    PT Next Visit Plan  Continue with more functional and advanced quad strengthening to assist with improving stair gait. (MD F/U 11/16/17)    Consulted and Agree with Plan of Care  Patient       Patient will benefit from skilled therapeutic intervention in order to improve the following deficits and impairments:  Abnormal gait, Decreased activity tolerance, Decreased range of motion, Decreased strength, Increased edema, Pain  Visit Diagnosis: Chronic pain of right knee  Localized edema  Stiffness of right knee, not elsewhere classified     Problem List Patient Active Problem List   Diagnosis Date Noted  .  Primary localized osteoarthritis of right knee 08/23/2017  . Hypothyroidism   . Hypertension   . Special screening for malignant neoplasms, colon     DUNFORD, CHRISTINA P, PTA 10/27/2017, 8:30 AM  [facility] [place], [place], [address] Phone: [phone]   Fax:  [phone]  Name: Ahsha Hinsley Reinhold MRN: [id number] Date of Birth: [birth year]

## 2017-11-01 ENCOUNTER — Ambulatory Visit: Payer: Medicare Other | Admitting: Physical Therapy

## 2017-11-01 ENCOUNTER — Encounter: Payer: Self-pay | Admitting: Physical Therapy

## 2017-11-01 DIAGNOSIS — M25661 Stiffness of right knee, not elsewhere classified: Secondary | ICD-10-CM

## 2017-11-01 DIAGNOSIS — G8929 Other chronic pain: Secondary | ICD-10-CM

## 2017-11-01 DIAGNOSIS — M25561 Pain in right knee: Principal | ICD-10-CM

## 2017-11-01 DIAGNOSIS — R600 Localized edema: Secondary | ICD-10-CM

## 2017-11-01 NOTE — Patient Instructions (Addendum)
  1-2 x dailyHip Hike   Stand on step, right leg off step, knee straight. Raise unsupported hip, keeping knee straight. Repeat _5-10___ times per set. Do __1-2__ sets per session. Do __1-2__ sessions       Strengthening: Wall Slide    Leaning on wall, slowly lower buttocks until thighs are parallel to floor. Hold _5___ seconds. Tighten thigh muscles and return. Repeat __10__ times per set. Do __2__ sets per session. Do _1-2___ sessions per day.   Terminal Knee Extension (Standing)    Facing anchor with right knee slightly bent and tubing just above knee, gently pull knee back straight. Do not overextend knee. Repeat ___10_ times per set. Do _3___ sets per session. Do __1-2__ sessions per day.

## 2017-11-01 NOTE — Therapy (Signed)
[facility] [place], [place], [address] Phone: [phone]   Fax:  [phone]  Physical Therapy Treatment  Patient Details  Name: Laurie Mckenzie MRN: [id number] Date of Birth: 1951-02-14 Referring Provider: Elsie Saas MD   Encounter Date: 11/01/2017  PT End of Session - 11/01/17 0749    Visit Number  18    Number of Visits  24    Date for PT Re-Evaluation  12/06/17    Authorization Type  FOTO AT LEAST EVERY 5TH VISIT.    PT Start Time  0730    PT Stop Time  0828    PT Time Calculation (min)  58 min    Activity Tolerance  Patient tolerated treatment well    Behavior During Therapy  WFL for tasks assessed/performed       Past Medical History:  Diagnosis Date  . Headache   . Hypertension    186/106 today    195/108 07/27/2017  . Hypothyroidism   . Primary osteoarthritis of knee    Right    Past Surgical History:  Procedure Laterality Date  . COLONOSCOPY N/A 01/16/2015   Procedure: COLONOSCOPY;  Surgeon: Daneil Dolin, MD;  [facility];  Service: Endoscopy;  Laterality: N/A;  8:30 AM  . KNEE ARTHROPLASTY Left 2011   [place]  . TOTAL KNEE ARTHROPLASTY Right 08/23/2017   Procedure: TOTAL KNEE ARTHROPLASTY;  Surgeon: Elsie Saas, MD;  Location: [place];  Service: Orthopedics;  Laterality: Right;    There were no vitals filed for this visit.  Subjective Assessment - 11/01/17 0733    Subjective  Doing good upon arrival per reported    Pertinent History  Left hemi-knee.  HTN.    How long can you walk comfortably?  Short community distances    Patient Stated Goals  Get back to normal.    Currently in Pain?  Yes    Pain Score  1     Pain Location  Knee    Pain Orientation  Right;Lateral    Pain Descriptors / Indicators  Discomfort    Pain Type  Surgical pain    Pain Onset  More than a month ago    Pain Frequency  Intermittent    Aggravating Factors   ROM in knee    Pain Relieving Factors   rest         [facility] PT Assessment - 11/01/17 0001      AROM   AROM Assessment Site  Knee    Right/Left Knee  Right    Right Knee Extension  -6    Right Knee Flexion  95      PROM   PROM Assessment Site  Knee    Right/Left Knee  Right    Right Knee Extension  -2    Right Knee Flexion  105      Special Tests   Other special tests  3cm difference                   [facility] Adult PT Treatment/Exercise - 11/01/17 0001      Knee/Hip Exercises: Aerobic   Stationary Bike  L3, seat 5 x15 min    Elliptical  50mn L5      Knee/Hip Exercises: Machines for Strengthening   Cybex Knee Extension  20# 4x10 reps    Cybex Knee Flexion  40# 4x10 reps     Cybex Leg Press  2 pl, seat 7 x30 reps with ball  squeeze      Knee/Hip Exercises: Standing   Terminal Knee Extension  Strengthening;Right;2 sets;10 reps;Limitations    Terminal Knee Extension Limitations  green t-band    Wall Squat  1 set;10 reps    Other Standing Knee Exercises  up/down stairs x2     Other Standing Knee Exercises  heel dot on 4" step      Electrical Stimulation   Electrical Stimulation Location  R knee    Electrical Stimulation Action  IFC    Electrical Stimulation Parameters  1-'10hz'$  x29mn    Electrical Stimulation Goals  Pain      Vasopneumatic   Number Minutes Vasopneumatic   15 minutes    Vasopnuematic Location   Knee    Vasopneumatic Pressure  Medium      Manual Therapy   Manual Therapy  Passive ROM    Passive ROM  PROM of R knee into flexion, extension with holds at end range             PT Education - 11/01/17 0759    Education provided  Yes    Education Details  HEP    Person(s) Educated  Patient    Methods  Explanation;Demonstration;Handout    Comprehension  Verbalized understanding;Returned demonstration          PT Long Term Goals - 11/01/17 0807      PT LONG TERM GOAL #1   Title  Independent with a HEP.    Time  8    Period  Weeks    Status  Achieved 11/01/17      PT  LONG TERM GOAL #2   Title  Full active right knee extension in order to normalize gait.    Time  8    Period  Weeks    Status  On-going      PT LONG TERM GOAL #3   Title  Active knee flexion to 115 degrees+ so the patient can perform functional tasks and do so with pain not > 2-3/10.    Time  8    Period  Weeks    Status  On-going      PT LONG TERM GOAL #4   Title  Decrease edema to within 2 cms of contralateral side to assist with range of motion gains and decrease pain.    Time  8    Period  Weeks    Status  On-going 3cm difference 11/01/17      PT LONG TERM GOAL #5   Title  Perform a reciprocating stair gait with one railing with pain not > 2-3/10.    Time  8    Period  Weeks    Status  Achieved 11/01/17            Plan - 11/01/17 0816    Clinical Impression Statement  Patient tolerated treatment well today. Patient had some decreased ROM in right knee today. Patient was educated on self stretches in different ways to improve ROM. HEP isued for right knee strength progression. Decreased edema in knee by 1/2 cm at this time. Patient able to perform a reciprovcating stair gait today.  Patient met LTG #1 and #5 today with others ongoing.     Rehab Potential  Excellent    Clinical Impairments Affecting Rehab Potential  surgery 08/23/17 7 weeks 10/11/17 FOTO 17th visit 24% limitation    PT Frequency  2x / week    PT Duration  8 weeks    PT Treatment/Interventions  Cryotherapy;Electrical Stimulation;Stair training;Functional mobility training;Therapeutic activities;Therapeutic exercise;Neuromuscular re-education;Patient/family education;Manual techniques;Passive range of motion;Vasopneumatic Device    PT Next Visit Plan  Continue with more functional and advanced quad strengthening to assist with improving stair gait. (MD F/U 11/16/17)    Consulted and Agree with Plan of Care  Patient       Patient will benefit from skilled therapeutic intervention in order to improve the following  deficits and impairments:  Abnormal gait, Decreased activity tolerance, Decreased range of motion, Decreased strength, Increased edema, Pain  Visit Diagnosis: Chronic pain of right knee  Localized edema  Stiffness of right knee, not elsewhere classified     Problem List Patient Active Problem List   Diagnosis Date Noted  . Primary localized osteoarthritis of right knee 08/23/2017  . Hypothyroidism   . Hypertension   . Special screening for malignant neoplasms, colon     Lanayah Gartley P, PTA 11/01/2017, 8:35 AM  [facility] [place], [place], [address] Phone: [phone]   Fax:  [phone]  Name: Tahliyah Anagnos Behney MRN: [id number] Date of Birth: 1950/07/23

## 2017-11-04 ENCOUNTER — Encounter: Payer: Self-pay | Admitting: Physical Therapy

## 2017-11-04 ENCOUNTER — Ambulatory Visit: Payer: Medicare Other | Admitting: Physical Therapy

## 2017-11-04 DIAGNOSIS — M25661 Stiffness of right knee, not elsewhere classified: Secondary | ICD-10-CM

## 2017-11-04 DIAGNOSIS — M25561 Pain in right knee: Secondary | ICD-10-CM | POA: Diagnosis not present

## 2017-11-04 DIAGNOSIS — G8929 Other chronic pain: Secondary | ICD-10-CM

## 2017-11-04 DIAGNOSIS — R600 Localized edema: Secondary | ICD-10-CM

## 2017-11-04 NOTE — Therapy (Signed)
[facility] [place], [place], [address] Phone: [phone]   Fax:  [phone]  Physical Therapy Treatment  Patient Details  Name: Laurie Mckenzie MRN: [id number] Date of Birth: 09-Oct-1950 Referring Provider: Elsie Saas MD   Encounter Date: 11/04/2017  PT End of Session - 11/04/17 0821    Visit Number  19    Number of Visits  24    Date for PT Re-Evaluation  12/06/17    Authorization Type  FOTO AT LEAST EVERY 5TH VISIT.    PT Start Time  0729    PT Stop Time  0830    PT Time Calculation (min)  61 min    Activity Tolerance  Patient tolerated treatment well    Behavior During Therapy  WFL for tasks assessed/performed       Past Medical History:  Diagnosis Date  . Headache   . Hypertension    186/106 today    195/108 07/27/2017  . Hypothyroidism   . Primary osteoarthritis of knee    Right    Past Surgical History:  Procedure Laterality Date  . COLONOSCOPY N/A 01/16/2015   Procedure: COLONOSCOPY;  Surgeon: Daneil Dolin, MD;  [facility];  Service: Endoscopy;  Laterality: N/A;  8:30 AM  . KNEE ARTHROPLASTY Left 2011   [place]  . TOTAL KNEE ARTHROPLASTY Right 08/23/2017   Procedure: TOTAL KNEE ARTHROPLASTY;  Surgeon: Elsie Saas, MD;  Location: [place];  Service: Orthopedics;  Laterality: Right;    There were no vitals filed for this visit.  Subjective Assessment - 11/04/17 0744    Subjective  Doing good upon arrival per reported    Pertinent History  Left hemi-knee.  HTN.    How long can you walk comfortably?  Short community distances    Patient Stated Goals  Get back to normal.    Currently in Pain?  Yes    Pain Score  1     Pain Location  Knee    Pain Orientation  Right;Lateral    Pain Descriptors / Indicators  Discomfort    Pain Type  Surgical pain    Pain Onset  More than a month ago    Pain Frequency  Intermittent    Aggravating Factors   ROM    Pain Relieving Factors  rest          [facility] PT Assessment - 11/04/17 0001      AROM   AROM Assessment Site  Knee    Right/Left Knee  Right    Right Knee Extension  -5    Right Knee Flexion  100      PROM   PROM Assessment Site  Knee    Right/Left Knee  Right    Right Knee Extension  -2    Right Knee Flexion  110                   [facility] Adult PT Treatment/Exercise - 11/04/17 0001      Knee/Hip Exercises: Aerobic   Stationary Bike  L3, seat 5 x15 min    Elliptical  21min L5      Knee/Hip Exercises: Machines for Strengthening   Cybex Knee Extension  20# 4x10 reps    Cybex Knee Flexion  40# 4x10 reps     Cybex Leg Press  2 pl, seat 7 x30 reps with ball squeeze      Knee/Hip Exercises: Standing   Terminal Knee Extension  Strengthening;Right;2 sets;10  reps;Limitations    Terminal Knee Extension Limitations  pink XTS    Wall Squat  2 sets;10 reps      Electrical Stimulation   Electrical Stimulation Location  R knee    Electrical Stimulation Action  IFC    Electrical Stimulation Parameters  1-10 hz x4min    Electrical Stimulation Goals  Pain      Vasopneumatic   Number Minutes Vasopneumatic   15 minutes    Vasopnuematic Location   Knee    Vasopneumatic Pressure  Medium      Manual Therapy   Manual Therapy  Passive ROM    Passive ROM  PROM of R knee into flexion, extension with holds at end range                  PT Long Term Goals - 11/01/17 1610      PT LONG TERM GOAL #1   Title  Independent with a HEP.    Time  8    Period  Weeks    Status  Achieved 11/01/17      PT LONG TERM GOAL #2   Title  Full active right knee extension in order to normalize gait.    Time  8    Period  Weeks    Status  On-going      PT LONG TERM GOAL #3   Title  Active knee flexion to 115 degrees+ so the patient can perform functional tasks and do so with pain not > 2-3/10.    Time  8    Period  Weeks    Status  On-going      PT LONG TERM GOAL #4   Title  Decrease edema to within 2 cms of  contralateral side to assist with range of motion gains and decrease pain.    Time  8    Period  Weeks    Status  On-going 3cm difference 11/01/17      PT LONG TERM GOAL #5   Title  Perform a reciprocating stair gait with one railing with pain not > 2-3/10.    Time  8    Period  Weeks    Status  Achieved 11/01/17            Plan - 11/04/17 0825    Clinical Impression Statement  Patient tolerated treatment well today and able to improve ROM after manual stretching. Patient progressing with right knee strengthening exercise. Patient has some ongoing edema and discomfort with ROM. Goals progressing.     Rehab Potential  Excellent    Clinical Impairments Affecting Rehab Potential  surgery 08/23/17 7 weeks 10/11/17 FOTO 17th visit 24% limitation    PT Frequency  2x / week    PT Duration  8 weeks    PT Treatment/Interventions  Cryotherapy;Electrical Stimulation;Stair training;Functional mobility training;Therapeutic activities;Therapeutic exercise;Neuromuscular re-education;Patient/family education;Manual techniques;Passive range of motion;Vasopneumatic Device    PT Next Visit Plan  Continue with more functional and advanced quad strengthening to assist with improving stair gait. (MD F/U 11/16/17)    Consulted and Agree with Plan of Care  Patient       Patient will benefit from skilled therapeutic intervention in order to improve the following deficits and impairments:  Abnormal gait, Decreased activity tolerance, Decreased range of motion, Decreased strength, Increased edema, Pain  Visit Diagnosis: Chronic pain of right knee  Localized edema  Stiffness of right knee, not elsewhere classified     Problem List Patient Active Problem List  Diagnosis Date Noted  . Primary localized osteoarthritis of right knee 08/23/2017  . Hypothyroidism   . Hypertension   . Special screening for malignant neoplasms, colon     Laurie Mckenzie P, PTA 11/04/2017, 8:37 AM  [facility] [place], [place], [address] Phone: [phone]   Fax:  [phone]  Name: Laurie Mckenzie MRN: [id number] Date of Birth: Feb 01, 1951

## 2017-11-09 ENCOUNTER — Encounter: Payer: Self-pay | Admitting: Physical Therapy

## 2017-11-09 ENCOUNTER — Ambulatory Visit: Payer: Medicare Other | Admitting: Physical Therapy

## 2017-11-09 DIAGNOSIS — R600 Localized edema: Secondary | ICD-10-CM

## 2017-11-09 DIAGNOSIS — M25661 Stiffness of right knee, not elsewhere classified: Secondary | ICD-10-CM

## 2017-11-09 DIAGNOSIS — G8929 Other chronic pain: Secondary | ICD-10-CM

## 2017-11-09 DIAGNOSIS — M25561 Pain in right knee: Secondary | ICD-10-CM | POA: Diagnosis not present

## 2017-11-09 NOTE — Therapy (Signed)
[facility] [place], [place], [address] Phone: [phone]   Fax:  [phone]  Physical Therapy Treatment  Patient Details  Name: Laurie Mckenzie MRN: [id number] Date of Birth: 07-May-1951 Referring Provider: Elsie Saas MD   Encounter Date: 11/09/2017  PT End of Session - 11/09/17 0739    Visit Number  20    Number of Visits  24    Date for PT Re-Evaluation  12/06/17    Authorization Type  FOTO AT LEAST EVERY 5TH VISIT.    PT Start Time  0731    PT Stop Time  0822    PT Time Calculation (min)  51 min    Activity Tolerance  Patient tolerated treatment well    Behavior During Therapy  [facility] for tasks assessed/performed       Past Medical History:  Diagnosis Date  . Headache   . Hypertension    186/106 today    195/108 07/27/2017  . Hypothyroidism   . Primary osteoarthritis of knee    Right    Past Surgical History:  Procedure Laterality Date  . COLONOSCOPY N/A 01/16/2015   Procedure: COLONOSCOPY;  Surgeon: Daneil Dolin, MD;  [facility];  Service: Endoscopy;  Laterality: N/A;  8:30 AM  . KNEE ARTHROPLASTY Left 2011   [place]  . TOTAL KNEE ARTHROPLASTY Right 08/23/2017   Procedure: TOTAL KNEE ARTHROPLASTY;  Surgeon: Elsie Saas, MD;  Location: [place];  Service: Orthopedics;  Laterality: Right;    There were no vitals filed for this visit.     Progress Note Reporting Period 10/07/17 to 11/09/17  See note below for Objective Data and Assessment of Progress/Goals.       Subjective Assessment - 11/09/17 0737    Subjective  Reports that her knee is good and finally beginning to feel "like a real knee."    Pertinent History  Left hemi-knee.  HTN.    How long can you walk comfortably?  Short community distances    Patient Stated Goals  Get back to normal.    Currently in Pain?  No/denies         [facility] PT Assessment - 11/09/17 0001      Assessment   Medical Diagnosis  Primary OA of  right knee.    Onset Date/Surgical Date  08/23/17    Next MD Visit  11/16/2017      Restrictions   Weight Bearing Restrictions  No                   [facility] Adult PT Treatment/Exercise - 11/09/17 0001      Knee/Hip Exercises: Aerobic   Stationary Bike  L4, seat 6 x15 min    Elliptical  R4, L4 x 2 min      Knee/Hip Exercises: Machines for Strengthening   Cybex Knee Extension  20# 3x10 reps    Cybex Knee Flexion  40# 3x10 reps    Cybex Leg Press  2 pl, seat 7, 2x10 reps with eccentric strengthening      Knee/Hip Exercises: Standing   Terminal Knee Extension  Strengthening;Right;2 sets;10 reps;Limitations    Terminal Knee Extension Limitations  pink XTS      Modalities   Modalities  Vasopneumatic      Vasopneumatic   Number Minutes Vasopneumatic   15 minutes    Vasopnuematic Location   Knee    Vasopneumatic Pressure  Medium    Vasopneumatic Temperature   34  PT Long Term Goals - 11/01/17 2831      PT LONG TERM GOAL #1   Title  Independent with a HEP.    Time  8    Period  Weeks    Status  Achieved 11/01/17      PT LONG TERM GOAL #2   Title  Full active right knee extension in order to normalize gait.    Time  8    Period  Weeks    Status  On-going      PT LONG TERM GOAL #3   Title  Active knee flexion to 115 degrees+ so the patient can perform functional tasks and do so with pain not > 2-3/10.    Time  8    Period  Weeks    Status  On-going      PT LONG TERM GOAL #4   Title  Decrease edema to within 2 cms of contralateral side to assist with range of motion gains and decrease pain.    Time  8    Period  Weeks    Status  On-going 3cm difference 11/01/17      PT LONG TERM GOAL #5   Title  Perform a reciprocating stair gait with one railing with pain not > 2-3/10.    Time  8    Period  Weeks    Status  Achieved 11/01/17            Plan - 11/09/17 0824    Clinical Impression Statement  Patient continues to tolerate  treatment well with no complaints of any increased pain. Eccentric strengthening initated with leg press today. Prolonged holds at end range were VCd for all exercises. Patient continues to fatigue very quickly while on elliptical. Normal modalities response noted following removal of the modalities.    Rehab Potential  Excellent    Clinical Impairments Affecting Rehab Potential  surgery 08/23/17 7 weeks 10/11/17 FOTO 17th visit 24% limitation    PT Frequency  2x / week    PT Duration  8 weeks    PT Treatment/Interventions  Cryotherapy;Electrical Stimulation;Stair training;Functional mobility training;Therapeutic activities;Therapeutic exercise;Neuromuscular re-education;Patient/family education;Manual techniques;Passive range of motion;Vasopneumatic Device    PT Next Visit Plan  Continue with more functional and advanced quad strengthening to assist with improving stair gait. (MD F/U 11/16/17)    Consulted and Agree with Plan of Care  Patient       Patient will benefit from skilled therapeutic intervention in order to improve the following deficits and impairments:  Abnormal gait, Decreased activity tolerance, Decreased range of motion, Decreased strength, Increased edema, Pain  Visit Diagnosis: Chronic pain of right knee  Localized edema  Stiffness of right knee, not elsewhere classified     Problem List Patient Active Problem List   Diagnosis Date Noted  . Primary localized osteoarthritis of right knee 08/23/2017  . Hypothyroidism   . Hypertension   . Special screening for malignant neoplasms, colon     Standley Brooking, PTA 11/09/2017, 8:30 AM  [facility] [place], [place], [address] Phone: [phone]   Fax:  [phone]  Name: Laurie Mckenzie MRN: [id number] Date of Birth: [birth year]

## 2017-11-11 ENCOUNTER — Ambulatory Visit: Payer: Medicare Other | Admitting: Physical Therapy

## 2017-11-11 ENCOUNTER — Encounter: Payer: Self-pay | Admitting: Physical Therapy

## 2017-11-11 DIAGNOSIS — M25561 Pain in right knee: Principal | ICD-10-CM

## 2017-11-11 DIAGNOSIS — R600 Localized edema: Secondary | ICD-10-CM

## 2017-11-11 DIAGNOSIS — G8929 Other chronic pain: Secondary | ICD-10-CM

## 2017-11-11 DIAGNOSIS — M25661 Stiffness of right knee, not elsewhere classified: Secondary | ICD-10-CM

## 2017-11-11 NOTE — Therapy (Signed)
[facility] [place], [place], [address] Phone: [phone]   Fax:  [phone]  Physical Therapy Treatment  Patient Details  Name: Laurie Mckenzie MRN: [id number] Date of Birth: Jul 07, 1950 Referring Provider: Elsie Saas MD   Encounter Date: 11/11/2017  PT End of Session - 11/11/17 0744    Visit Number  21    Number of Visits  24    Date for PT Re-Evaluation  12/06/17    Authorization Type  FOTO AT LEAST EVERY 5TH VISIT.    PT Start Time  0729    PT Stop Time  0818    PT Time Calculation (min)  49 min    Activity Tolerance  Patient tolerated treatment well    Behavior During Therapy  [facility] for tasks assessed/performed       Past Medical History:  Diagnosis Date  . Headache   . Hypertension    186/106 today    195/108 07/27/2017  . Hypothyroidism   . Primary osteoarthritis of knee    Right    Past Surgical History:  Procedure Laterality Date  . COLONOSCOPY N/A 01/16/2015   Procedure: COLONOSCOPY;  Surgeon: Daneil Dolin, MD;  [facility];  Service: Endoscopy;  Laterality: N/A;  8:30 AM  . KNEE ARTHROPLASTY Left 2011   [place]  . TOTAL KNEE ARTHROPLASTY Right 08/23/2017   Procedure: TOTAL KNEE ARTHROPLASTY;  Surgeon: Elsie Saas, MD;  Location: [place];  Service: Orthopedics;  Laterality: Right;    There were no vitals filed for this visit.  Subjective Assessment - 11/11/17 0743    Subjective  No new complaints upon arrival.    Pertinent History  Left hemi-knee.  HTN.    How long can you walk comfortably?  Short community distances    Patient Stated Goals  Get back to normal.    Currently in Pain?  No/denies         [facility] PT Assessment - 11/11/17 0001      Assessment   Medical Diagnosis  Primary OA of right knee.    Onset Date/Surgical Date  08/23/17    Next MD Visit  11/16/2017      Restrictions   Weight Bearing Restrictions  No      Observation/Other Assessments-Edema    Edema   Circumferential      Circumferential Edema   Circumferential - Right  42.9 cm    Circumferential - Left   45.2 cm      ROM / Strength   AROM / PROM / Strength  AROM      AROM   Overall AROM   Deficits    AROM Assessment Site  Knee    Right/Left Knee  Right    Right Knee Extension  -7    Right Knee Flexion  110                   [facility] Adult PT Treatment/Exercise - 11/11/17 0001      Knee/Hip Exercises: Aerobic   Stationary Bike  L4, seat 6 x15 min      Knee/Hip Exercises: Machines for Strengthening   Cybex Knee Extension  20# 3x10 reps    Cybex Knee Flexion  40# 3x10 reps    Cybex Leg Press  2 pl, seat 7, 2x10 reps with eccentric strengthening      Knee/Hip Exercises: Standing   Wall Squat  2 sets;10 reps      Modalities   Modalities  Vasopneumatic      Vasopneumatic   Number Minutes Vasopneumatic   15 minutes    Vasopnuematic Location   Knee    Vasopneumatic Pressure  Medium    Vasopneumatic Temperature   34                  PT Long Term Goals - 11/11/17 1610      PT LONG TERM GOAL #1   Title  Independent with a HEP.    Time  8    Period  Weeks    Status  Achieved 11/01/17      PT LONG TERM GOAL #2   Title  Full active right knee extension in order to normalize gait.    Time  8    Period  Weeks    Status  On-going R knee extension -7 deg from neutral 11/11/2017      PT LONG TERM GOAL #3   Title  Active knee flexion to 115 degrees+ so the patient can perform functional tasks and do so with pain not > 2-3/10.    Time  8    Period  Weeks    Status  On-going 110 deg R knee flexion 11/11/2017      PT LONG TERM GOAL #4   Title  Decrease edema to within 2 cms of contralateral side to assist with range of motion gains and decrease pain.    Time  8    Period  Weeks    Status  On-going 3cm difference 11/01/17      PT LONG TERM GOAL #5   Title  Perform a reciprocating stair gait with one railing with pain not > 2-3/10.    Time  8    Period   Weeks    Status  Achieved 11/01/17            Plan - 11/11/17 9604    Clinical Impression Statement  Patient tolerated today's treatment well with focus on strengthening and eccentric control of the RLE. No complaints of pain during any exercises today. AROM of R knee still deficient and measured as 7-110 deg. 2.3 cm difference in regards to edema with R knee > L knee. Normal vasopnuematic response noted following removal of the modalities.     Rehab Potential  Excellent    Clinical Impairments Affecting Rehab Potential  surgery 08/23/17 7 weeks 10/11/17 FOTO 17th visit 24% limitation    PT Frequency  2x / week    PT Duration  8 weeks    PT Treatment/Interventions  Cryotherapy;Electrical Stimulation;Stair training;Functional mobility training;Therapeutic activities;Therapeutic exercise;Neuromuscular re-education;Patient/family education;Manual techniques;Passive range of motion;Vasopneumatic Device    PT Next Visit Plan  MD note required next treatment.    Consulted and Agree with Plan of Care  Patient       Patient will benefit from skilled therapeutic intervention in order to improve the following deficits and impairments:  Abnormal gait, Decreased activity tolerance, Decreased range of motion, Decreased strength, Increased edema, Pain  Visit Diagnosis: Chronic pain of right knee  Localized edema  Stiffness of right knee, not elsewhere classified     Problem List Patient Active Problem List   Diagnosis Date Noted  . Primary localized osteoarthritis of right knee 08/23/2017  . Hypothyroidism   . Hypertension   . Special screening for malignant neoplasms, colon     Standley Brooking, PTA 11/11/2017, 8:25 AM  [facility] [place], [place], [address] Phone: [phone]  Fax:  [phone]  Name: Laurie Mckenzie MRN: [id number] Date of Birth: Aug 21, 1950

## 2017-11-15 ENCOUNTER — Ambulatory Visit: Payer: Medicare Other | Admitting: Physical Therapy

## 2017-11-15 DIAGNOSIS — R600 Localized edema: Secondary | ICD-10-CM

## 2017-11-15 DIAGNOSIS — G8929 Other chronic pain: Secondary | ICD-10-CM

## 2017-11-15 DIAGNOSIS — M25661 Stiffness of right knee, not elsewhere classified: Secondary | ICD-10-CM

## 2017-11-15 DIAGNOSIS — M25561 Pain in right knee: Secondary | ICD-10-CM | POA: Diagnosis not present

## 2017-11-15 NOTE — Therapy (Signed)
[facility] [place], [place], [address] Phone: [phone]   Fax:  [phone]  Physical Therapy Treatment  Patient Details  Name: Laurie Mckenzie MRN: [id number] Date of Birth: 10/23/1950 Referring Provider: Elsie Saas MD   Encounter Date: 11/15/2017  PT End of Session - 11/15/17 0733    Visit Number  22    Number of Visits  24    Date for PT Re-Evaluation  12/06/17    Authorization Type  FOTO AT LEAST EVERY 5TH VISIT.    PT Start Time  0730    PT Stop Time  0830    PT Time Calculation (min)  60 min    Activity Tolerance  Patient tolerated treatment well    Behavior During Therapy  [facility] for tasks assessed/performed       Past Medical History:  Diagnosis Date  . Headache   . Hypertension    186/106 today    195/108 07/27/2017  . Hypothyroidism   . Primary osteoarthritis of knee    Right    Past Surgical History:  Procedure Laterality Date  . COLONOSCOPY N/A 01/16/2015   Procedure: COLONOSCOPY;  Surgeon: Daneil Dolin, MD;  [facility];  Service: Endoscopy;  Laterality: N/A;  8:30 AM  . KNEE ARTHROPLASTY Left 2011   [place]  . TOTAL KNEE ARTHROPLASTY Right 08/23/2017   Procedure: TOTAL KNEE ARTHROPLASTY;  Surgeon: Elsie Saas, MD;  Location: [place];  Service: Orthopedics;  Laterality: Right;    There were no vitals filed for this visit.  Subjective Assessment - 11/15/17 0733    Subjective  Patient to see MD tomorrow. Patient reports her knee is starting to feel like her own.    Pertinent History  Left hemi-knee.  HTN.    How long can you walk comfortably?  Short community distances    Patient Stated Goals  Get back to normal.    Currently in Pain?  No/denies         [facility] PT Assessment - 11/15/17 0001      Assessment   Medical Diagnosis  Primary OA of right knee.    Onset Date/Surgical Date  08/23/17    Next MD Visit  11/16/2017      ROM / Strength   AROM / PROM / Strength  AROM       AROM   Overall AROM   Deficits    AROM Assessment Site  Knee    Right/Left Knee  Right    Right Knee Extension  -5    Right Knee Flexion  112                   [facility] Adult PT Treatment/Exercise - 11/15/17 0001      Knee/Hip Exercises: Aerobic   Stationary Bike  L4, seat 6 x15 min      Knee/Hip Exercises: Machines for Strengthening   Cybex Knee Extension  20# 3x10 reps    Cybex Knee Flexion  40# 3x10 reps    Cybex Leg Press  --      Knee/Hip Exercises: Standing   Terminal Knee Extension  Strengthening;Right;2 sets;10 reps;Limitations    Terminal Knee Extension Limitations  pink XTS      Knee/Hip Exercises: Prone   Prone Knee Hang  2 minutes;Weights    Prone Knee Hang Weights (lbs)  2      Modalities   Modalities  Vasopneumatic      Vasopneumatic   Number Minutes  Vasopneumatic   15 minutes    Vasopnuematic Location   Knee    Vasopneumatic Pressure  Medium    Vasopneumatic Temperature   34      Manual Therapy   Manual Therapy  Passive ROM;Joint mobilization    Joint Mobilization  inf/sup R patella mobs to improve ROM    Passive ROM  PROM of R knee into flexion, extension with holds at end range                  PT Long Term Goals - 11/15/17 0830      PT LONG TERM GOAL #1   Title  Independent with a HEP.    Time  8    Period  Weeks    Status  Achieved 11/01/17      PT LONG TERM GOAL #2   Title  Full active right knee extension in order to normalize gait.    Time  8    Period  Weeks    Status  On-going 5-112 degrees AROM      PT LONG TERM GOAL #3   Title  Active knee flexion to 115 degrees+ so the patient can perform functional tasks and do so with pain not > 2-3/10.    Time  8    Period  Weeks    Status  On-going 5-112 degrees AROM      PT LONG TERM GOAL #4   Title  Decrease edema to within 2 cms of contralateral side to assist with range of motion gains and decrease pain.    Time  8    Period  Weeks    Status  On-going 3 cm  difference 11/11/17      PT LONG TERM GOAL #5   Title  Perform a reciprocating stair gait with one railing with pain not > 2-3/10.    Time  8    Period  Weeks    Status  Achieved            Plan - 11/15/17 0820    Clinical Impression Statement  Patient was able to tolerate treatment well and performed all TEs with good form. Patient still lmited with right knee AROM, 5-112 degrees. Normal response to modalities upon removal.    Clinical Presentation  Stable    Clinical Decision Making  Low    Rehab Potential  Excellent    Clinical Impairments Affecting Rehab Potential  surgery 08/23/17 7 weeks 10/11/17 FOTO 17th visit 24% limitation    PT Frequency  2x / week    PT Duration  8 weeks    PT Treatment/Interventions  Cryotherapy;Electrical Stimulation;Stair training;Functional mobility training;Therapeutic activities;Therapeutic exercise;Neuromuscular re-education;Patient/family education;Manual techniques;Passive range of motion;Vasopneumatic Device    PT Next Visit Plan  Cont right knee strength and ROM pending new referral from MD.    Consulted and Agree with Plan of Care  Patient       Patient will benefit from skilled therapeutic intervention in order to improve the following deficits and impairments:  Abnormal gait, Decreased activity tolerance, Decreased range of motion, Decreased strength, Increased edema, Pain  Visit Diagnosis: Chronic pain of right knee  Localized edema  Stiffness of right knee, not elsewhere classified     Problem List Patient Active Problem List   Diagnosis Date Noted  . Primary localized osteoarthritis of right knee 08/23/2017  . Hypothyroidism   . Hypertension   . Special screening for malignant neoplasms, colon     Gabriela Eves, PT,  DPT 11/15/2017, 8:36 AM  [facility] [address] [place], [place], [address] Phone: [phone]   Fax:  [phone]  Name: Laurie Mckenzie MRN:  [id number] Date of Birth: 07-Nov-1950

## 2017-11-18 ENCOUNTER — Encounter: Payer: Self-pay | Admitting: Physical Therapy

## 2017-11-18 ENCOUNTER — Ambulatory Visit: Payer: Medicare Other | Admitting: Physical Therapy

## 2017-11-18 DIAGNOSIS — G8929 Other chronic pain: Secondary | ICD-10-CM

## 2017-11-18 DIAGNOSIS — R600 Localized edema: Secondary | ICD-10-CM

## 2017-11-18 DIAGNOSIS — M25561 Pain in right knee: Secondary | ICD-10-CM | POA: Diagnosis not present

## 2017-11-18 DIAGNOSIS — M25661 Stiffness of right knee, not elsewhere classified: Secondary | ICD-10-CM

## 2017-11-18 NOTE — Therapy (Signed)
[facility] [place], [place], [address] Phone: [phone]   Fax:  [phone]  Physical Therapy Treatment  Patient Details  Name: Laurie Mckenzie MRN: [id number] Date of Birth: [birth year] Referring Provider: Elsie Saas MD   Encounter Date: 11/18/2017  PT End of Session - 11/18/17 0838    Visit Number  23    Number of Visits  24    Date for PT Re-Evaluation  12/06/17    PT Start Time  0815    PT Stop Time  0914    PT Time Calculation (min)  59 min    Activity Tolerance  Patient tolerated treatment well    Behavior During Therapy  [facility] for tasks assessed/performed       Past Medical History:  Diagnosis Date  . Headache   . Hypertension    186/106 today    195/108 07/27/2017  . Hypothyroidism   . Primary osteoarthritis of knee    Right    Past Surgical History:  Procedure Laterality Date  . COLONOSCOPY N/A 01/16/2015   Procedure: COLONOSCOPY;  Surgeon: Daneil Dolin, MD;  [facility];  Service: Endoscopy;  Laterality: N/A;  8:30 AM  . KNEE ARTHROPLASTY Left 2011   [place]  . TOTAL KNEE ARTHROPLASTY Right 08/23/2017   Procedure: TOTAL KNEE ARTHROPLASTY;  Surgeon: Elsie Saas, MD;  Location: [place];  Service: Orthopedics;  Laterality: Right;    There were no vitals filed for this visit.  Subjective Assessment - 11/18/17 0821    Subjective  Patient is please with overall progress and ready to DC today    Pertinent History  Left hemi-knee.  HTN.    How long can you walk comfortably?  Short community distances    Patient Stated Goals  Get back to normal.    Currently in Pain?  Yes    Pain Score  1     Pain Location  Knee    Pain Orientation  Right    Pain Descriptors / Indicators  Discomfort    Pain Type  Surgical pain    Pain Onset  More than a month ago    Pain Frequency  Intermittent    Aggravating Factors   increased activity/ROM    Pain Relieving Factors  rest                        [facility] Adult PT Treatment/Exercise - 11/18/17 0001      Knee/Hip Exercises: Aerobic   Stationary Bike  L4, seat 6 x15 min      Knee/Hip Exercises: Machines for Strengthening   Cybex Knee Extension  20# 3x10 reps    Cybex Knee Flexion  40# 3x10 reps    Cybex Leg Press  2 pl, seat 7, 2x10 reps with eccentric strengthening      Knee/Hip Exercises: Standing   Terminal Knee Extension  Strengthening;Right;2 sets;10 reps;Limitations    Terminal Knee Extension Limitations  pink XTS      Knee/Hip Exercises: Prone   Prone Knee Hang  2 minutes;Weights    Prone Knee Hang Weights (lbs)  2      Acupuncturist Location  R knee    Electrical Stimulation Action  IFC    Electrical Stimulation Parameters  1-'10hz'$  x31mn    Electrical Stimulation Goals  -- per request      Vasopneumatic   Number Minutes Vasopneumatic   15 minutes  Vasopnuematic Location   Knee    Vasopneumatic Pressure  Medium    Vasopneumatic Temperature   34                  PT Long Term Goals - 11/18/17 0844      PT LONG TERM GOAL #1   Title  Independent with a HEP.    Time  8    Period  Weeks    Status  Achieved      PT LONG TERM GOAL #2   Title  Full active right knee extension in order to normalize gait.    Time  8    Period  Weeks    Status  Not Met -5 degrees 11/18/17      PT LONG TERM GOAL #3   Title  Active knee flexion to 115 degrees+ so the patient can perform functional tasks and do so with pain not > 2-3/10.    Time  8    Period  Weeks    Status  Not Met AROM 112 11/18/17      PT LONG TERM GOAL #4   Title  Decrease edema to within 2 cms of contralateral side to assist with range of motion gains and decrease pain.    Time  8    Period  Weeks    Status  Not Met 3cm 11/18/17      PT LONG TERM GOAL #5   Title  Perform a reciprocating stair gait with one railing with pain not > 2-3/10.    Time  8    Period  Weeks    Status   Achieved            Plan - 11/18/17 0856    Clinical Impression Statement  Patient tolerated treatment well today. Patient able to perform all activities independently. Patient will be doing HEP and joining gym. Patient met all goals except ROM and edema. DC today FOTO 20% limitation    Rehab Potential  Excellent    PT Frequency  2x / week    PT Duration  8 weeks    PT Treatment/Interventions  Cryotherapy;Electrical Stimulation;Stair training;Functional mobility training;Therapeutic activities;Therapeutic exercise;Neuromuscular re-education;Patient/family education;Manual techniques;Passive range of motion;Vasopneumatic Device    PT Next Visit Plan  DC    Consulted and Agree with Plan of Care  Patient       Patient will benefit from skilled therapeutic intervention in order to improve the following deficits and impairments:  Abnormal gait, Decreased activity tolerance, Decreased range of motion, Decreased strength, Increased edema, Pain  Visit Diagnosis: Chronic pain of right knee  Localized edema  Stiffness of right knee, not elsewhere classified     Problem List Patient Active Problem List   Diagnosis Date Noted  . Primary localized osteoarthritis of right knee 08/23/2017  . Hypothyroidism   . Hypertension   . Special screening for malignant neoplasms, colon     Ladean Raya, PTA 11/18/17 9:15 AM  [facility] [place], [place], [address] Phone: [phone]   Fax:  [phone]  Name: Laurie Mckenzie MRN: [id number] Date of Birth: July 13, 1950  PHYSICAL THERAPY DISCHARGE SUMMARY  Visits from Start of Care: 23.  Current functional level related to goals / functional outcomes: See above.   Remaining deficits: See goal section.   Education / Equipment: HEP. Plan: Patient agrees to discharge.  Patient goals were partially met. Patient is being discharged due to being pleased with the current  functional level.  ?????         Mali Applegate MPT

## 2019-01-16 ENCOUNTER — Other Ambulatory Visit: Payer: Self-pay

## 2019-01-17 ENCOUNTER — Encounter: Payer: Self-pay | Admitting: Family Medicine

## 2019-01-17 ENCOUNTER — Ambulatory Visit (INDEPENDENT_AMBULATORY_CARE_PROVIDER_SITE_OTHER): Payer: Medicare Other | Admitting: Family Medicine

## 2019-01-17 VITALS — BP 151/83 | HR 90 | Temp 99.3°F | Ht 64.5 in | Wt 216.8 lb

## 2019-01-17 DIAGNOSIS — R7309 Other abnormal glucose: Secondary | ICD-10-CM

## 2019-01-17 DIAGNOSIS — I10 Essential (primary) hypertension: Secondary | ICD-10-CM

## 2019-01-17 DIAGNOSIS — Z1159 Encounter for screening for other viral diseases: Secondary | ICD-10-CM

## 2019-01-17 DIAGNOSIS — E039 Hypothyroidism, unspecified: Secondary | ICD-10-CM

## 2019-01-17 MED ORDER — LEVOTHYROXINE SODIUM 150 MCG PO TABS: 150.0000 ug | ORAL_TABLET | Freq: Every day | ORAL | 1 refills | Status: DC

## 2019-01-17 MED ORDER — MONTELUKAST SODIUM 10 MG PO TABS: 10.0000 mg | ORAL_TABLET | Freq: Every day | ORAL | 1 refills | Status: DC

## 2019-01-17 MED ORDER — AMLODIPINE BESYLATE 2.5 MG PO TABS: 2.5000 mg | ORAL_TABLET | Freq: Every day | ORAL | 1 refills | Status: DC

## 2019-01-17 NOTE — Progress Notes (Signed)
BP (!) 151/83   Pulse 90   Temp 99.3 F (37.4 C) (Other (Comment))   Ht 5' 4.5" (1.638 m)   Wt 216 lb 12.8 oz (98.3 kg)   BMI 36.64 kg/m    Subjective:    Patient ID: Laurie Mckenzie, female    DOB: Aug 06, 1950, 68 y.o.   MRN: [id number]  HPI: Laurie Mckenzie is a 68 y.o. female presenting on 01/17/2019 for New Patient (Initial Visit) (check up of chronic medical conditions) and Establish Care   HPI Hypertension Patient is currently on amlodipine, and their blood pressure today is 151/83. Patient denies any lightheadedness or dizziness. Patient denies headaches, blurred vision, chest pains, shortness of breath, or weakness. Denies any side effects from medication and is content with current medication.   Hypothyroidism recheck Patient is coming in for thyroid recheck today as well. They deny any issues with hair changes or heat or cold problems or diarrhea or constipation. They deny any chest pain or palpitations. They are currently on levothyroxine 157mcrograms   Relevant past medical, surgical, family and social history reviewed and updated as indicated. Interim medical history since our last visit reviewed. Allergies and medications reviewed and updated.  Review of Systems  Constitutional: Negative for chills and fever.  HENT: Negative for congestion, ear discharge and ear pain.   Eyes: Negative for redness and visual disturbance.  Respiratory: Negative for chest tightness and shortness of breath.   Cardiovascular: Negative for chest pain and leg swelling.  Genitourinary: Negative for difficulty urinating and dysuria.  Musculoskeletal: Negative for back pain and gait problem.  Skin: Negative for rash.  Neurological: Negative for dizziness, light-headedness, numbness and headaches.  Psychiatric/Behavioral: Negative for agitation and behavioral problems.  All other systems reviewed and are negative.   Per HPI unless specifically indicated above  Social History   Socioeconomic  History  . Marital status: Single    Spouse name: Not on file  . Number of children: Not on file  . Years of education: Not on file  . Highest education level: Not on file  Occupational History  . Not on file  Social Needs  . Financial resource strain: Not on file  . Food insecurity    Worry: Not on file    Inability: Not on file  . Transportation needs    Medical: Not on file    Non-medical: Not on file  Tobacco Use  . Smoking status: Never Smoker  . Smokeless tobacco: Never Used  Substance and Sexual Activity  . Alcohol use: No  . Drug use: No  . Sexual activity: Not Currently    Comment: not in relationship currently  Lifestyle  . Physical activity    Days per week: Not on file    Minutes per session: Not on file  . Stress: Not on file  Relationships  . Social [place] phone: Not on file    Gets together: Not on file    Attends religious service: Not on file    Active member of club or organization: Not on file    Attends meetings of clubs or organizations: Not on file    Relationship status: Not on file  . Intimate partner violence    Fear of current or ex partner: Not on file    Emotionally abused: Not on file    Physically abused: Not on file    Forced sexual activity: Not on file  Other Topics Concern  . Not on  file  Social History Narrative  . Not on file    Past Surgical History:  Procedure Laterality Date  . COLONOSCOPY N/A 01/16/2015   Procedure: COLONOSCOPY;  Surgeon: Daneil Dolin, MD;  [facility];  Service: Endoscopy;  Laterality: N/A;  8:30 AM  . KNEE ARTHROPLASTY Left 2011   [place]  . TOTAL KNEE ARTHROPLASTY Right 08/23/2017   Procedure: TOTAL KNEE ARTHROPLASTY;  Surgeon: Elsie Saas, MD;  Location: [facility];  Service: Orthopedics;  Laterality: Right;    Family History  Problem Relation Age of Onset  . Hypertension Mother   . Lung disease Father     Allergies as of 01/17/2019      Reactions   Bee Venom  Swelling, Other (See Comments)   Red and hot to site   Fish Allergy Hives   Ivp Dye [iodinated Diagnostic Agents] Other (See Comments)   MD advised not to use, due to fish allergies      Medication List       Accurate as of January 17, 2019 11:59 PM. If you have any questions, ask your nurse or doctor.        STOP taking these medications   aspirin 325 MG EC tablet Stopped by: Fransisca Kaufmann Annelie Boak, MD   docusate sodium 100 MG capsule Commonly known as: COLACE Stopped by: Fransisca Kaufmann Tisha Cline, MD   oxyCODONE 5 MG immediate release tablet Commonly known as: Oxy IR/ROXICODONE Stopped by: Fransisca Kaufmann Aahana Elza, MD   polyethylene glycol 17 g packet Commonly known as: MIRALAX / GLYCOLAX Stopped by: Fransisca Kaufmann Lemario Chaikin, MD     TAKE these medications   acetaminophen 500 MG tablet Commonly known as: TYLENOL Take 1,000-1,500 mg by mouth every 6 (six) hours as needed for moderate pain or headache.   amLODipine 2.5 MG tablet Commonly known as: NORVASC Take 1 tablet (2.5 mg total) by mouth daily.   CALCIUM 1200 PO Take 2 tablets by mouth daily.   EPINEPHrine 0.3 mg/0.3 mL Soaj injection Commonly known as: EPI-PEN Inject 0.3 mg into the muscle once.   levothyroxine 150 MCG tablet Commonly known as: SYNTHROID Take 1 tablet (150 mcg total) by mouth daily. What changed: Another medication with the same name was removed. Continue taking this medication, and follow the directions you see here. Changed by: Fransisca Kaufmann Jalon Squier, MD   montelukast 10 MG tablet Commonly known as: SINGULAIR Take 1 tablet (10 mg total) by mouth at bedtime.   multivitamin tablet Take 1 tablet by mouth daily.   VITAMIN D3 PO Take 1 capsule by mouth daily.          Objective:    BP (!) 151/83   Pulse 90   Temp 99.3 F (37.4 C) (Other (Comment))   Ht 5' 4.5" (1.638 m)   Wt 216 lb 12.8 oz (98.3 kg)   BMI 36.64 kg/m   Wt Readings from Last 3 Encounters:  01/17/19 216 lb 12.8 oz (98.3 kg)  08/11/17  209 lb (94.8 kg)  08/12/17 202 lb 12.8 oz (92 kg)    Physical Exam Vitals signs and nursing note reviewed.  Constitutional:      General: She is not in acute distress.    Appearance: She is well-developed. She is not diaphoretic.  Eyes:     Conjunctiva/sclera: Conjunctivae normal.     Pupils: Pupils are equal, round, and reactive to light.  Cardiovascular:     Rate and Rhythm: Normal rate and regular rhythm.  Heart sounds: Normal heart sounds. No murmur.  Pulmonary:     Effort: Pulmonary effort is normal. No respiratory distress.     Breath sounds: Normal breath sounds. No wheezing.  Musculoskeletal: Normal range of motion.        General: No tenderness.  Skin:    General: Skin is warm and dry.     Findings: No rash.  Neurological:     Mental Status: She is alert and oriented to person, place, and time.     Coordination: Coordination normal.  Psychiatric:        Behavior: Behavior normal.         Assessment & Plan:   Problem List Items Addressed This Visit      Cardiovascular and Mediastinum   Hypertension - Primary   Relevant Medications   amLODipine (NORVASC) 2.5 MG tablet   Other Relevant Orders   CBC with Differential/Platelet (Completed)   CMP14+EGFR (Completed)   Lipid panel (Completed)     Endocrine   Hypothyroidism   Relevant Medications   levothyroxine (SYNTHROID) 150 MCG tablet   Other Relevant Orders   CBC with Differential/Platelet (Completed)   TSH (Completed)    Other Visit Diagnoses    Need for hepatitis C screening test       Relevant Orders   Hepatitis C antibody (Completed)      Continue current medication that she was on previously including Singulair and levothyroxine and Norvasc. Will do blood work today and she will monitor blood pressures at home Follow up plan: Return in about 6 months (around 07/20/2019), or if symptoms worsen or fail to improve, for thyroid and htn.  Caryl Pina, MD [facility]  01/22/2019, 9:35 PM

## 2019-01-18 ENCOUNTER — Telehealth: Payer: Self-pay | Admitting: Family Medicine

## 2019-01-18 LAB — CBC WITH DIFFERENTIAL/PLATELET
Basophils Absolute: 0.1 10*3/uL (ref 0.0–0.2)
Basos: 1 %
EOS (ABSOLUTE): 0.1 10*3/uL (ref 0.0–0.4)
Eos: 2 %
Hematocrit: 42.2 % (ref 34.0–46.6)
Hemoglobin: 14.8 g/dL (ref 11.1–15.9)
Immature Grans (Abs): 0.1 10*3/uL (ref 0.0–0.1)
Immature Granulocytes: 2 %
Lymphocytes Absolute: 2 10*3/uL (ref 0.7–3.1)
Lymphs: 23 %
MCH: 32.2 pg (ref 26.6–33.0)
MCHC: 35.1 g/dL (ref 31.5–35.7)
MCV: 92 fL (ref 79–97)
Monocytes Absolute: 0.9 10*3/uL (ref 0.1–0.9)
Monocytes: 10 %
Neutrophils Absolute: 5.6 10*3/uL (ref 1.4–7.0)
Neutrophils: 62 %
Platelets: 262 10*3/uL (ref 150–450)
RBC: 4.6 x10E6/uL (ref 3.77–5.28)
RDW: 12.9 % (ref 11.7–15.4)
WBC: 8.9 10*3/uL (ref 3.4–10.8)

## 2019-01-18 LAB — HEPATITIS C ANTIBODY: Hep C Virus Ab: <0.1 s/co ratio (ref 0.0–0.9)

## 2019-01-18 LAB — CMP14+EGFR
ALT: 37 IU/L — ABNORMAL HIGH (ref 0–32)
AST: 37 IU/L (ref 0–40)
Albumin/Globulin Ratio: 1.6 (ref 1.2–2.2)
Albumin: 4.3 g/dL (ref 3.8–4.8)
Alkaline Phosphatase: 90 IU/L (ref 39–117)
BUN/Creatinine Ratio: 16 (ref 12–28)
BUN: 12 mg/dL (ref 8–27)
Bilirubin Total: <0.2 mg/dL (ref 0.0–1.2)
CO2: 23 mmol/L (ref 20–29)
Calcium: 9.7 mg/dL (ref 8.7–10.3)
Chloride: 99 mmol/L (ref 96–106)
Creatinine, Ser: 0.74 mg/dL (ref 0.57–1.00)
GFR calc Af Amer: 97 mL/min/{1.73_m2} (ref >59)
GFR calc non Af Amer: 84 mL/min/{1.73_m2} (ref >59)
Globulin, Total: 2.7 g/dL (ref 1.5–4.5)
Glucose: 118 mg/dL — ABNORMAL HIGH (ref 65–99)
Potassium: 3.8 mmol/L (ref 3.5–5.2)
Sodium: 140 mmol/L (ref 134–144)
Total Protein: 7 g/dL (ref 6.0–8.5)

## 2019-01-18 LAB — LIPID PANEL
Chol/HDL Ratio: 6.7 ratio — ABNORMAL HIGH (ref 0.0–4.4)
Cholesterol, Total: 214 mg/dL — ABNORMAL HIGH (ref 100–199)
HDL: 32 mg/dL — ABNORMAL LOW (ref >39)
Triglycerides: 593 mg/dL (ref 0–149)

## 2019-01-18 LAB — TSH: TSH: 2.43 u[IU]/mL (ref 0.450–4.500)

## 2019-01-18 MED ORDER — ATORVASTATIN CALCIUM 20 MG PO TABS: 20.0000 mg | ORAL_TABLET | Freq: Every day | ORAL | 3 refills | Status: DC

## 2019-01-18 NOTE — Telephone Encounter (Signed)
Please let the patient know that her cholesterol was significantly elevated and specifically her triglycerides and I have sent atorvastatin for and recommend diet and exercise and avoid fatty and fried foods.  The rest of her blood work including kidneys liver and blood counts and thyroid and hepatitis C screening were all normal. Caryl Pina, MD [facility] 01/18/2019, 3:49 PM

## 2019-01-23 LAB — HGB A1C W/O EAG: Hgb A1c MFr Bld: 6 % — ABNORMAL HIGH (ref 4.8–5.6)

## 2019-01-23 LAB — SPECIMEN STATUS REPORT

## 2019-02-10 ENCOUNTER — Encounter: Payer: Self-pay | Admitting: Family Medicine

## 2019-03-20 ENCOUNTER — Other Ambulatory Visit: Payer: Self-pay

## 2019-03-21 ENCOUNTER — Other Ambulatory Visit: Payer: Self-pay

## 2019-03-21 ENCOUNTER — Ambulatory Visit (INDEPENDENT_AMBULATORY_CARE_PROVIDER_SITE_OTHER): Payer: Medicare Other

## 2019-03-21 ENCOUNTER — Telehealth: Payer: Self-pay | Admitting: Family Medicine

## 2019-03-21 DIAGNOSIS — Z23 Encounter for immunization: Secondary | ICD-10-CM | POA: Diagnosis not present

## 2019-04-13 ENCOUNTER — Other Ambulatory Visit: Payer: Self-pay | Admitting: *Deleted

## 2019-04-13 MED ORDER — EPINEPHRINE 0.3 MG/0.3ML IJ SOAJ: 0.3000 mg | Freq: Once | INTRAMUSCULAR | 0 refills | Status: AC

## 2019-07-14 ENCOUNTER — Other Ambulatory Visit: Payer: Self-pay

## 2019-07-14 ENCOUNTER — Telehealth: Payer: Self-pay | Admitting: Family Medicine

## 2019-07-14 DIAGNOSIS — R7303 Prediabetes: Secondary | ICD-10-CM

## 2019-07-14 DIAGNOSIS — I10 Essential (primary) hypertension: Secondary | ICD-10-CM

## 2019-07-14 DIAGNOSIS — E039 Hypothyroidism, unspecified: Secondary | ICD-10-CM

## 2019-07-14 DIAGNOSIS — E782 Mixed hyperlipidemia: Secondary | ICD-10-CM

## 2019-07-14 NOTE — Telephone Encounter (Signed)
Please order preferred labs.

## 2019-07-16 NOTE — Telephone Encounter (Signed)
Placed lab for patient

## 2019-07-17 ENCOUNTER — Other Ambulatory Visit: Payer: Medicare Other

## 2019-07-17 ENCOUNTER — Other Ambulatory Visit: Payer: Self-pay

## 2019-07-17 DIAGNOSIS — R7303 Prediabetes: Secondary | ICD-10-CM

## 2019-07-17 DIAGNOSIS — E782 Mixed hyperlipidemia: Secondary | ICD-10-CM

## 2019-07-17 DIAGNOSIS — I10 Essential (primary) hypertension: Secondary | ICD-10-CM

## 2019-07-17 DIAGNOSIS — E039 Hypothyroidism, unspecified: Secondary | ICD-10-CM

## 2019-07-17 LAB — BAYER DCA HB A1C WAIVED: HB A1C (BAYER DCA - WAIVED): 5.9 % (ref <7.0)

## 2019-07-17 NOTE — Telephone Encounter (Signed)
Left message advising lab orders placed and to call back with any further questions or concerns.

## 2019-07-18 LAB — LIPID PANEL
Chol/HDL Ratio: 3.2 ratio (ref 0.0–4.4)
Cholesterol, Total: 123 mg/dL (ref 100–199)
HDL: 38 mg/dL — ABNORMAL LOW (ref >39)
LDL Chol Calc (NIH): 43 mg/dL (ref 0–99)
Triglycerides: 276 mg/dL — ABNORMAL HIGH (ref 0–149)
VLDL Cholesterol Cal: 42 mg/dL — ABNORMAL HIGH (ref 5–40)

## 2019-07-18 LAB — CMP14+EGFR
ALT: 22 IU/L (ref 0–32)
AST: 22 IU/L (ref 0–40)
Albumin/Globulin Ratio: 1.9 (ref 1.2–2.2)
Albumin: 4.5 g/dL (ref 3.8–4.8)
Alkaline Phosphatase: 118 IU/L — ABNORMAL HIGH (ref 39–117)
BUN/Creatinine Ratio: 12 (ref 12–28)
BUN: 9 mg/dL (ref 8–27)
Bilirubin Total: 0.3 mg/dL (ref 0.0–1.2)
CO2: 24 mmol/L (ref 20–29)
Calcium: 9.7 mg/dL (ref 8.7–10.3)
Chloride: 103 mmol/L (ref 96–106)
Creatinine, Ser: 0.75 mg/dL (ref 0.57–1.00)
GFR calc Af Amer: 95 mL/min/{1.73_m2} (ref >59)
GFR calc non Af Amer: 82 mL/min/{1.73_m2} (ref >59)
Globulin, Total: 2.4 g/dL (ref 1.5–4.5)
Glucose: 99 mg/dL (ref 65–99)
Potassium: 4.2 mmol/L (ref 3.5–5.2)
Sodium: 142 mmol/L (ref 134–144)
Total Protein: 6.9 g/dL (ref 6.0–8.5)

## 2019-07-18 LAB — TSH: TSH: 1.76 u[IU]/mL (ref 0.450–4.500)

## 2019-07-19 ENCOUNTER — Other Ambulatory Visit: Payer: Self-pay

## 2019-07-20 ENCOUNTER — Other Ambulatory Visit: Payer: Self-pay

## 2019-07-20 ENCOUNTER — Ambulatory Visit (INDEPENDENT_AMBULATORY_CARE_PROVIDER_SITE_OTHER): Payer: Medicare Other | Admitting: Family Medicine

## 2019-07-20 ENCOUNTER — Encounter: Payer: Self-pay | Admitting: Family Medicine

## 2019-07-20 VITALS — BP 139/79 | HR 87 | Temp 98.4°F | Ht 64.5 in | Wt 201.0 lb

## 2019-07-20 DIAGNOSIS — I10 Essential (primary) hypertension: Secondary | ICD-10-CM | POA: Diagnosis not present

## 2019-07-20 DIAGNOSIS — E782 Mixed hyperlipidemia: Secondary | ICD-10-CM | POA: Diagnosis not present

## 2019-07-20 DIAGNOSIS — E039 Hypothyroidism, unspecified: Secondary | ICD-10-CM | POA: Diagnosis not present

## 2019-07-20 MED ORDER — LEVOTHYROXINE SODIUM 150 MCG PO TABS: 150.0000 ug | ORAL_TABLET | Freq: Every day | ORAL | 3 refills | Status: DC

## 2019-07-20 MED ORDER — MONTELUKAST SODIUM 10 MG PO TABS: 10.0000 mg | ORAL_TABLET | Freq: Every day | ORAL | 3 refills | Status: DC

## 2019-07-20 MED ORDER — ATORVASTATIN CALCIUM 20 MG PO TABS: 20.0000 mg | ORAL_TABLET | Freq: Every day | ORAL | 3 refills | Status: DC

## 2019-07-20 MED ORDER — AMLODIPINE BESYLATE 2.5 MG PO TABS: 2.5000 mg | ORAL_TABLET | Freq: Every day | ORAL | 3 refills | Status: DC

## 2019-07-20 NOTE — Progress Notes (Signed)
BP 139/79   Pulse 87   Temp 98.4 F (36.9 C) (Temporal)   Ht 5' 4.5" (1.638 m)   Wt 201 lb (91.2 kg)   SpO2 96%   BMI 33.97 kg/m    Subjective:   Patient ID: Laurie Mckenzie, female    DOB: 01-Apr-1951, 69 y.o.   MRN: [id number]  HPI: Laurie Mckenzie is a 69 y.o. female presenting on 07/20/2019 for Hypertension (6 month follow up)   HPI Hypertension Patient is currently on amlodipine, and their blood pressure today is 139/79. Patient denies any lightheadedness or dizziness. Patient denies headaches, blurred vision, chest pains, shortness of breath, or weakness. Denies any side effects from medication and is content with current medication.   Hyperlipidemia Patient is coming in for recheck of his hyperlipidemia. The patient is currently taking atorvastatin. They deny any issues with myalgias or history of liver damage from it. They deny any focal numbness or weakness or chest pain.   Hypothyroidism recheck Patient is coming in for thyroid recheck today as well. They deny any issues with hair changes or heat or cold problems or diarrhea or constipation. They deny any chest pain or palpitations. They are currently on levothyroxine 150 micrograms   Relevant past medical, surgical, family and social history reviewed and updated as indicated. Interim medical history since our last visit reviewed. Allergies and medications reviewed and updated.  Review of Systems  Constitutional: Negative for chills and fever.  HENT: Negative for congestion, ear discharge, ear pain and tinnitus.   Eyes: Negative for pain, redness and visual disturbance.  Respiratory: Negative for cough, chest tightness, shortness of breath and wheezing.   Cardiovascular: Negative for chest pain, palpitations and leg swelling.  Gastrointestinal: Negative for abdominal pain, blood in stool, constipation and diarrhea.  Genitourinary: Negative for difficulty urinating, dysuria and hematuria.  Musculoskeletal: Negative for back  pain, gait problem and myalgias.  Skin: Negative for rash.  Neurological: Negative for dizziness, weakness, light-headedness and headaches.  Psychiatric/Behavioral: Negative for agitation, behavioral problems and suicidal ideas.  All other systems reviewed and are negative.   Per HPI unless specifically indicated above   Allergies as of 07/20/2019      Reactions   Sulfa Antibiotics Other (See Comments)   Bee Venom Swelling, Other (See Comments)   Red and hot to site   Fish Allergy Hives   Ivp Dye [iodinated Diagnostic Agents] Other (See Comments)   MD advised not to use, due to fish allergies      Medication List       Accurate as of July 20, 2019 11:16 AM. If you have any questions, ask your nurse or doctor.        acetaminophen 500 MG tablet Commonly known as: TYLENOL Take 1,000-1,500 mg by mouth every 6 (six) hours as needed for moderate pain or headache.   amLODipine 2.5 MG tablet Commonly known as: NORVASC Take 1 tablet (2.5 mg total) by mouth daily.   atorvastatin 20 MG tablet Commonly known as: LIPITOR Take 1 tablet (20 mg total) by mouth daily.   CALCIUM 1200 PO Take 2 tablets by mouth daily.   EPINEPHrine 0.3 mg/0.3 mL Soaj injection Commonly known as: EPI-PEN Inject 0.3 mLs (0.3 mgttotal) into the muscleoonce for 1 dose.   levothyroxine 150 MCG tablet Commonly known as: SYNTHROID Take 1 tablet (150 mcg total) by mouth daily.   montelukast 10 MG tablet Commonly known as: SINGULAIR Take 1 tablet (10 mg total) by mouth at bedtime.  multivitamin tablet Take 1 tablet by mouth daily.   VITAMIN D3 PO Take 1 capsule by mouth daily.        Objective:   BP 139/79   Pulse 87   Temp 98.4 F (36.9 C) (Temporal)   Ht 5' 4.5" (1.638 m)   Wt 201 lb (91.2 kg)   SpO2 96%   BMI 33.97 kg/m   Wt Readings from Last 3 Encounters:  07/20/19 201 lb (91.2 kg)  01/17/19 216 lb 12.8 oz (98.3 kg)  08/11/17 209 lb (94.8 kg)    Physical Exam Vitals and  nursing note reviewed.  Constitutional:      General: She is not in acute distress.    Appearance: She is well-developed. She is not diaphoretic.  Eyes:     Conjunctiva/sclera: Conjunctivae normal.  Cardiovascular:     Rate and Rhythm: Normal rate and regular rhythm.     Heart sounds: Normal heart sounds. No murmur.  Pulmonary:     Effort: Pulmonary effort is normal. No respiratory distress.     Breath sounds: Normal breath sounds. No wheezing.  Abdominal:     General: Abdomen is flat. Bowel sounds are normal. There is no distension.     Tenderness: There is no abdominal tenderness. There is no guarding or rebound.  Musculoskeletal:        General: No swelling or tenderness. Normal range of motion.  Skin:    General: Skin is warm and dry.     Findings: No rash.  Neurological:     Mental Status: She is alert and oriented to person, place, and time.     Coordination: Coordination normal.  Psychiatric:        Behavior: Behavior normal.     Results for orders placed or performed in visit on 07/17/19  TSH  Result Value Ref Range   TSH 1.760 0.450 - 4.500 uIU/mL  Lipid panel  Result Value Ref Range   Cholesterol, Total 123 100 - 199 mg/dL   Triglycerides 276 (H) 0 - 149 mg/dL   HDL 38 (L) >39 mg/dL   VLDL Cholesterol Cal 42 (H) 5 - 40 mg/dL   LDL Chol Calc (NIH) 43 0 - 99 mg/dL   Chol/HDL Ratio 3.2 0.0 - 4.4 ratio  hgba1c  Result Value Ref Range   HB A1C (BAYER DCA - WAIVED) 5.9 <7.0 %  CMP14+EGFR  Result Value Ref Range   Glucose 99 65 - 99 mg/dL   BUN 9 8 - 27 mg/dL   Creatinine, Ser 0.75 0.57 - 1.00 mg/dL   GFR calc non Af Amer 82 >59 mL/min/1.73   GFR calc Af Amer 95 >59 mL/min/1.73   BUN/Creatinine Ratio 12 12 - 28   Sodium 142 134 - 144 mmol/L   Potassium 4.2 3.5 - 5.2 mmol/L   Chloride 103 96 - 106 mmol/L   CO2 24 20 - 29 mmol/L   Calcium 9.7 8.7 - 10.3 mg/dL   Total Protein 6.9 6.0 - 8.5 g/dL   Albumin 4.5 3.8 - 4.8 g/dL   Globulin, Total 2.4 1.5 - 4.5 g/dL    Albumin/Globulin Ratio 1.9 1.2 - 2.2   Bilirubin Total 0.3 0.0 - 1.2 mg/dL   Alkaline Phosphatase 118 (H) 39 - 117 IU/L   AST 22 0 - 40 IU/L   ALT 22 0 - 32 IU/L    Assessment & Plan:   Problem List Items Addressed This Visit      Cardiovascular and Mediastinum  Hypertension - Primary   Relevant Medications   EPINEPHrine 0.3 mg/0.3 mL IJ SOAJ injection   amLODipine (NORVASC) 2.5 MG tablet   atorvastatin (LIPITOR) 20 MG tablet     Endocrine   Hypothyroidism   Relevant Medications   levothyroxine (SYNTHROID) 150 MCG tablet     Other   Hyperlipidemia   Relevant Medications   EPINEPHrine 0.3 mg/0.3 mL IJ SOAJ injection   amLODipine (NORVASC) 2.5 MG tablet   atorvastatin (LIPITOR) 20 MG tablet      Patient's blood work looks good from 3 days ago and her blood pressure is controlled and cholesterol is much improved, continue current medication, no change see back in 6 months Follow up plan: Return in about 6 months (around 01/17/2020), or if symptoms worsen or fail to improve, for Thyroid recheck.  Counseling provided for all of the vaccine components No orders of the defined types were placed in this encounter.   Caryl Pina, MD [facility] 07/20/2019, 11:16 AM

## 2019-07-20 NOTE — Patient Instructions (Signed)
We are committed to keeping you informed about the COVID-19 vaccine.  As the vaccine continues to become available for each phase, we will ensure that patients who meet the criteria receive the information they need to access vaccination opportunities. Continue to check your MyChart account and [email or web address] for updates. Please review the Phase 1b information below.  Following [place]'s guidelines for the distribution of COVID-19 vaccines, we are pleased to share our plans to begin offering vaccines to those 69 and older (Phase 1b). Here are details of those plans:  [facility] . Appointments required. . Open to those age 69 or older . Not restricted to [place] or [place] residents . Location: [facility] - [address], [place], [place]  . Offered daily beginning: Saturday, July 08, 2019 . Hours: 8 a.m. to 1 p.m. (10 a.m. to 2 p.m. this Saturday and Sunday, Jan. 9 and 10) . Registration for vaccine clinic appointments is open as of 10 a.m., Friday, January 8 . Please visit [email or web address] to register or call [phone] . There will be no copay required for the vaccine. Insurance information will be requested if available.  [facility] will offer this vaccination clinic through Sunday, January 17, after which we will switch to a larger location to offer public vaccination at a larger scale following state guidelines. We will provide additional information as details become available.   In addition to the clinic above, we are working in partnership with county health agencies in , [place], [place] and [place] counties to ensure continuing vaccination availability in alignment with state guidelines in the weeks and months ahead.   Information on phase 1b COVID-19 vaccination clinics being offered by local county health agencies is provided on each county health department's website.  [place]'s phase 1b vaccination guidelines, prioritizing those 69 and over as the next eligible group to receive the COVID-19 vaccine, are detailed at [email or web address].   Additional information: Those 69 and older who register for [facility]'s COVID-19 vaccination clinic at [facility] will be provided with additional information, including the need to be observed for 15 minutes following vaccination for your safety. Our COVID-19 testing clinic will not start at this site until 2 p.m. daily to ensure no people arriving for vaccination intersect with those seeking a COVID-19 test. Our [facility] rooms at the [facility] are clean and safe, and our staff will use all appropriate protective equipment to keep you safe. This vaccine clinic will be located in a completely separate area of this facility than where health care is provided. No interaction will occur between patients or care teams at this site and our vaccination clinic.   As we proceed through phases of the vaccine rollout, we remind everyone to remain vigilant in practicing the 3 W's - wear a mask, wash your hands and wait 6 feet apart from others. These safety practices are based in science and are the best tool we have to reduce the spread of the virus.   For our most current information, please visit [email or web address].  

## 2020-01-04 ENCOUNTER — Ambulatory Visit (INDEPENDENT_AMBULATORY_CARE_PROVIDER_SITE_OTHER): Payer: Medicare Other | Admitting: Nurse Practitioner

## 2020-01-04 ENCOUNTER — Other Ambulatory Visit: Payer: Self-pay

## 2020-01-04 ENCOUNTER — Encounter: Payer: Self-pay | Admitting: Nurse Practitioner

## 2020-01-04 VITALS — BP 171/84 | HR 94 | Temp 98.3°F | Ht 64.5 in | Wt 214.0 lb

## 2020-01-04 DIAGNOSIS — I10 Essential (primary) hypertension: Secondary | ICD-10-CM | POA: Diagnosis not present

## 2020-01-04 DIAGNOSIS — R300 Dysuria: Secondary | ICD-10-CM | POA: Insufficient documentation

## 2020-01-04 LAB — URINALYSIS, COMPLETE
Bilirubin, UA: NEGATIVE
Glucose, UA: NEGATIVE
Ketones, UA: NEGATIVE
Nitrite, UA: NEGATIVE
Protein,UA: NEGATIVE
Specific Gravity, UA: 1.02 (ref 1.005–1.030)
Urobilinogen, Ur: 0.2 mg/dL (ref 0.2–1.0)
pH, UA: 8.5 — ABNORMAL HIGH (ref 5.0–7.5)

## 2020-01-04 LAB — MICROSCOPIC EXAMINATION: Renal Epithel, UA: NONE SEEN /hpf

## 2020-01-04 MED ORDER — NITROFURANTOIN MONOHYD MACRO 100 MG PO CAPS: 100.0000 mg | ORAL_CAPSULE | Freq: Two times a day (BID) | ORAL | 0 refills | Status: DC

## 2020-01-04 NOTE — Patient Instructions (Signed)

## 2020-01-04 NOTE — Assessment & Plan Note (Signed)
Patient is 69 year old female who is in clinic today following up for hypertension.  Patient was diagnosed in 2019.  Patient is tolerating her medication well without side effects.  Compliance with treatment has been good including taking medication as directed, maintaining a healthy diet and regular exercise regimen.  She is following up as directed.  Today blood pressures are high with systolic numbers within 767-209 and diastolic numbers 47-09.  Patient equates this numbers to the whitecoat syndrome as she reports elevated blood pressures every time she comes to clinic. Provided education to patient with printed handouts.  Patient knows to call with elevated blood pressures, and will keep a blood pressure log for a week. Current blood pressure medication reviewed.

## 2020-01-04 NOTE — Progress Notes (Signed)
Acute Office Visit  Subjective:    Patient ID: Laurie Mckenzie, female    DOB: 1950-07-02, 69 y.o.   MRN: [id number]  Chief Complaint:  Dysuria   Urinary Tract Infection  This is a new problem. The current episode started in the past 7 days. The problem occurs intermittently. The problem has been gradually improving. The quality of the pain is described as burning. The pain is mild. There has been no fever. There is no history of pyelonephritis. Associated symptoms include frequency. Pertinent negatives include no flank pain, hematuria, nausea or vomiting. She has tried nothing for the symptoms.   Pt presents for follow up of hypertension. Patient was diagnosed in 2019. The patient is tolerating the medication well without side effects. Compliance with treatment has been good; including taking medication as directed , maintains a healthy diet and regular exercise regimen , and following up as directed.  Going over blood pressure numbers with patient today patient reports blood pressures are high every time she comes to clinic, she equates numbers to the whitecoat syndrome, and will report if her numbers are high at home.  Past Medical History:  Diagnosis Date  . Headache   . Hypertension    186/106 today    195/108 07/27/2017  . Hypothyroidism   . Primary osteoarthritis of knee    Right    Past Surgical History:  Procedure Laterality Date  . COLONOSCOPY N/A 01/16/2015   Procedure: COLONOSCOPY;  Surgeon: Daneil Dolin, MD;  Location: [facility];  Service: Endoscopy;  Laterality: N/A;  8:30 AM  . KNEE ARTHROPLASTY Left 2011   [place]  . TOTAL KNEE ARTHROPLASTY Right 08/23/2017   Procedure: TOTAL KNEE ARTHROPLASTY;  Surgeon: Elsie Saas, MD;  Location: [facility];  Service: Orthopedics;  Laterality: Right;    Family History  Problem Relation Age of Onset  . Hypertension Mother   . Lung disease Father     Social History   Socioeconomic History  . Marital status: Single     Spouse name: Not on file  . Number of children: Not on file  . Years of education: Not on file  . Highest education level: Not on file  Occupational History  . Not on file  Tobacco Use  . Smoking status: Never Smoker  . Smokeless tobacco: Never Used  Vaping Use  . Vaping Use: Never used  Substance and Sexual Activity  . Alcohol use: No  . Drug use: No  . Sexual activity: Not Currently    Comment: not in relationship currently  Other Topics Concern  . Not on file  Social History Narrative  . Not on file   Social Determinants of Health   Financial Resource Strain:   . Difficulty of Paying Living Expenses:   Food Insecurity:   . Worried About Charity fundraiser in the Last Year:   . Arboriculturist in the Last Year:   Transportation Needs:   . Film/video editor (Medical):   Marland Kitchen Lack of Transportation (Non-Medical):   Physical Activity:   . Days of Exercise per Week:   . Minutes of Exercise per Session:   Stress:   . Feeling of Stress :   Social Connections:   . Frequency of Communication with Friends and Family:   . Frequency of Social Gatherings with Friends and Family:   . Attends Religious Services:   . Active Member of Clubs or Organizations:   . Attends Archivist Meetings:   .  Marital Status:   Intimate Partner Violence:   . Fear of Current or Ex-Partner:   . Emotionally Abused:   Marland Kitchen Physically Abused:   . Sexually Abused:     Outpatient Medications Prior to Visit  Medication Sig Dispense Refill  . acetaminophen (TYLENOL) 500 MG tablet Take 1,000-1,500 mg by mouth every 6 (six) hours as needed for moderate pain or headache.     Marland Kitchen amLODipine (NORVASC) 2.5 MG tablet Take 1 tablet (2.5 mg total) by mouth daily. 90 tablet 3  . atorvastatin (LIPITOR) 20 MG tablet Take 1 tablet (20 mg total) by mouth daily. 90 tablet 3  . Calcium Carbonate-Vit D-Min (CALCIUM 1200 PO) Take 2 tablets by mouth daily.     . Cholecalciferol (VITAMIN D3 PO) Take 1 capsule  by mouth daily.    Marland Kitchen EPINEPHrine 0.3 mg/0.3 mL IJ SOAJ injection Inject 0.3 mLs (0.3 mgttotal) into the muscleoonce for 1 dose.    . levothyroxine (SYNTHROID) 150 MCG tablet Take 1 tablet (150 mcg total) by mouth daily. 90 tablet 3  . montelukast (SINGULAIR) 10 MG tablet Take 1 tablet (10 mg total) by mouth at bedtime. 90 tablet 3  . Multiple Vitamin (MULTIVITAMIN) tablet Take 1 tablet by mouth daily.     No facility-administered medications prior to visit.    Allergies  Allergen Reactions  . Sulfa Antibiotics Other (See Comments)  . Bee Venom Swelling and Other (See Comments)    Red and hot to site  . Fish Allergy Hives  . Ivp Dye [Iodinated Diagnostic Agents] Other (See Comments)    MD advised not to use, due to fish allergies    Review of Systems  Constitutional: Negative for appetite change and fever.  HENT: Negative.   Respiratory: Negative.   Gastrointestinal: Negative for nausea and vomiting.  Endocrine: Negative.   Genitourinary: Positive for dysuria and frequency. Negative for flank pain, hematuria, pelvic pain, vaginal bleeding and vaginal pain.  Musculoskeletal: Negative.   Skin: Negative for rash.  Psychiatric/Behavioral: The patient is not nervous/anxious.        Objective:    Physical Exam Vitals reviewed.  Constitutional:      Appearance: Normal appearance.  HENT:     Head: Normocephalic.  Eyes:     Conjunctiva/sclera: Conjunctivae normal.  Cardiovascular:     Rate and Rhythm: Normal rate.     Pulses: Normal pulses.     Heart sounds: Normal heart sounds.  Pulmonary:     Effort: Pulmonary effort is normal.     Breath sounds: Normal breath sounds.  Abdominal:     General: Bowel sounds are normal.     Tenderness: There is no right CVA tenderness or left CVA tenderness.  Genitourinary:    Comments: Dysuria Musculoskeletal:        General: No tenderness.     Cervical back: Neck supple.  Skin:    General: Skin is dry.     Findings: No rash.    Neurological:     Mental Status: She is alert and oriented to person, place, and time.  Psychiatric:        Mood and Affect: Mood normal.     BP (!) 171/84   Pulse 94   Temp 98.3 F (36.8 C)   Ht 5' 4.5" (1.638 m)   Wt 214 lb (97.1 kg)   SpO2 98%   BMI 36.17 kg/m  Wt Readings from Last 3 Encounters:  01/04/20 214 lb (97.1 kg)  07/20/19 201 lb (91.2 kg)  01/17/19 216 lb 12.8 oz (98.3 kg)    Health Maintenance Due  Topic Date Due  . COVID-19 Vaccine (1) Never done  . DEXA SCAN  Never done    There are no preventive care reminders to display for this patient.   Lab Results  Component Value Date   TSH 1.760 07/17/2019   Lab Results  Component Value Date   WBC 8.9 01/17/2019   HGB 14.8 01/17/2019   HCT 42.2 01/17/2019   MCV 92 01/17/2019   PLT 262 01/17/2019   Lab Results  Component Value Date   NA 142 07/17/2019   K 4.2 07/17/2019   CO2 24 07/17/2019   GLUCOSE 99 07/17/2019   BUN 9 07/17/2019   CREATININE 0.75 07/17/2019   BILITOT 0.3 07/17/2019   ALKPHOS 118 (H) 07/17/2019   AST 22 07/17/2019   ALT 22 07/17/2019   PROT 6.9 07/17/2019   ALBUMIN 4.5 07/17/2019   CALCIUM 9.7 07/17/2019   ANIONGAP 12 08/24/2017   Lab Results  Component Value Date   CHOL 123 07/17/2019   Lab Results  Component Value Date   HDL 38 (L) 07/17/2019   Lab Results  Component Value Date   LDLCALC 43 07/17/2019   Lab Results  Component Value Date   TRIG 276 (H) 07/17/2019   Lab Results  Component Value Date   CHOLHDL 3.2 07/17/2019   Lab Results  Component Value Date   HGBA1C 5.9 07/17/2019       Assessment & Plan:  Hypertension Patient is 69 year old female who is in clinic today following up for hypertension.  Patient was diagnosed in 2019.  Patient is tolerating her medication well without side effects.  Compliance with treatment has been good including taking medication as directed, maintaining a healthy diet and regular exercise regimen.  She is following  up as directed.  Today blood pressures are high with systolic numbers within 732-202 and diastolic numbers 54-27.  Patient equates this numbers to the whitecoat syndrome as she reports elevated blood pressures every time she comes to clinic. Provided education to patient with printed handouts.  Patient knows to call with elevated blood pressures, and will keep a blood pressure log for a week. Current blood pressure medication reviewed.  Dysuria Patient is 69 year old female in clinic today for dysuria, current episode started in the past 7 days.  Patient reported on Tuesday she was mowing her lawn when she felt sudden pain and sensation to use the bathroom.  Patient reports she has had this symptoms in the past and it turned out to be a urinary tract infection.  Today on assessment patient is not reporting CVA tenderness, fever, nausea or vomiting.  Patient is reporting gradual symptom improvement. Urine dipstick completed:  positive leukocytes,  trace blood with few bacteria. Started patient on Macrobid 100 mg twice daily for 5 days Urine sent for culture. Rx sent to pharmacy. Patient knows to call for worsening or unresolved symptoms.  Problem List Items Addressed This Visit      Other   Dysuria - Primary   Relevant Orders   Urinalysis, Complete       Meds ordered this encounter  Medications  . nitrofurantoin, macrocrystal-monohydrate, (MACROBID) 100 MG capsule    Sig: Take 1 capsule (100 mg total) by mouth 2 (two) times daily.    Dispense:  10 capsule    Refill:  0    Order Specific Question:   Supervising Provider    Answer:   Caryl Pina  A [[id number]]     Ivy Lynn, NP

## 2020-01-04 NOTE — Assessment & Plan Note (Signed)
Patient is 69 year old female in clinic today for dysuria, current episode started in the past 7 days.  Patient reported on Tuesday she was mowing her lawn when she felt sudden pain and sensation to use the bathroom.  Patient reports she has had this symptoms in the past and it turned out to be a urinary tract infection.  Today on assessment patient is not reporting CVA tenderness, fever, nausea or vomiting.  Patient is reporting gradual symptom improvement. Urine dipstick completed:  positive leukocytes,  trace blood with few bacteria. Started patient on Macrobid 100 mg twice daily for 5 days Urine sent for culture. Rx sent to pharmacy. Patient knows to call for worsening or unresolved symptoms.

## 2020-01-06 LAB — URINE CULTURE

## 2020-01-15 ENCOUNTER — Other Ambulatory Visit: Payer: Self-pay | Admitting: Family Medicine

## 2020-01-15 ENCOUNTER — Other Ambulatory Visit: Payer: Self-pay

## 2020-01-15 ENCOUNTER — Other Ambulatory Visit: Payer: Medicare Other

## 2020-01-15 DIAGNOSIS — E782 Mixed hyperlipidemia: Secondary | ICD-10-CM

## 2020-01-15 DIAGNOSIS — E039 Hypothyroidism, unspecified: Secondary | ICD-10-CM

## 2020-01-15 DIAGNOSIS — I10 Essential (primary) hypertension: Secondary | ICD-10-CM

## 2020-01-15 NOTE — Progress Notes (Signed)
Placed lab orders for patient before visit.

## 2020-01-16 LAB — CBC WITH DIFFERENTIAL/PLATELET
Basophils Absolute: 0.1 10*3/uL (ref 0.0–0.2)
Basos: 1 %
EOS (ABSOLUTE): 0.3 10*3/uL (ref 0.0–0.4)
Eos: 3 %
Hematocrit: 44.4 % (ref 34.0–46.6)
Hemoglobin: 15.3 g/dL (ref 11.1–15.9)
Immature Grans (Abs): 0.1 10*3/uL (ref 0.0–0.1)
Immature Granulocytes: 1 %
Lymphocytes Absolute: 2.5 10*3/uL (ref 0.7–3.1)
Lymphs: 32 %
MCH: 31.9 pg (ref 26.6–33.0)
MCHC: 34.5 g/dL (ref 31.5–35.7)
MCV: 93 fL (ref 79–97)
Monocytes Absolute: 0.6 10*3/uL (ref 0.1–0.9)
Monocytes: 8 %
Neutrophils Absolute: 4.2 10*3/uL (ref 1.4–7.0)
Neutrophils: 55 %
Platelets: 243 10*3/uL (ref 150–450)
RBC: 4.79 x10E6/uL (ref 3.77–5.28)
RDW: 12.7 % (ref 11.7–15.4)
WBC: 7.7 10*3/uL (ref 3.4–10.8)

## 2020-01-16 LAB — CMP14+EGFR
ALT: 25 IU/L (ref 0–32)
AST: 20 IU/L (ref 0–40)
Albumin/Globulin Ratio: 1.6 (ref 1.2–2.2)
Albumin: 4.6 g/dL (ref 3.8–4.8)
Alkaline Phosphatase: 128 IU/L — ABNORMAL HIGH (ref 48–121)
BUN/Creatinine Ratio: 15 (ref 12–28)
BUN: 11 mg/dL (ref 8–27)
Bilirubin Total: <0.2 mg/dL (ref 0.0–1.2)
CO2: 24 mmol/L (ref 20–29)
Calcium: 9.7 mg/dL (ref 8.7–10.3)
Chloride: 101 mmol/L (ref 96–106)
Creatinine, Ser: 0.75 mg/dL (ref 0.57–1.00)
GFR calc Af Amer: 95 mL/min/{1.73_m2} (ref >59)
GFR calc non Af Amer: 82 mL/min/{1.73_m2} (ref >59)
Globulin, Total: 2.8 g/dL (ref 1.5–4.5)
Glucose: 104 mg/dL — ABNORMAL HIGH (ref 65–99)
Potassium: 4.4 mmol/L (ref 3.5–5.2)
Sodium: 139 mmol/L (ref 134–144)
Total Protein: 7.4 g/dL (ref 6.0–8.5)

## 2020-01-16 LAB — LIPID PANEL
Chol/HDL Ratio: 3.8 ratio (ref 0.0–4.4)
Cholesterol, Total: 133 mg/dL (ref 100–199)
HDL: 35 mg/dL — ABNORMAL LOW (ref >39)
LDL Chol Calc (NIH): 51 mg/dL (ref 0–99)
Triglycerides: 301 mg/dL — ABNORMAL HIGH (ref 0–149)
VLDL Cholesterol Cal: 47 mg/dL — ABNORMAL HIGH (ref 5–40)

## 2020-01-16 LAB — TSH: TSH: 3.67 u[IU]/mL (ref 0.450–4.500)

## 2020-01-17 ENCOUNTER — Other Ambulatory Visit: Payer: Self-pay

## 2020-01-17 ENCOUNTER — Ambulatory Visit (INDEPENDENT_AMBULATORY_CARE_PROVIDER_SITE_OTHER): Payer: Medicare Other | Admitting: Family Medicine

## 2020-01-17 ENCOUNTER — Encounter: Payer: Self-pay | Admitting: Family Medicine

## 2020-01-17 VITALS — BP 128/79 | HR 86 | Temp 98.1°F | Ht 64.0 in | Wt 211.0 lb

## 2020-01-17 DIAGNOSIS — E782 Mixed hyperlipidemia: Secondary | ICD-10-CM

## 2020-01-17 DIAGNOSIS — I10 Essential (primary) hypertension: Secondary | ICD-10-CM | POA: Diagnosis not present

## 2020-01-17 DIAGNOSIS — Z780 Asymptomatic menopausal state: Secondary | ICD-10-CM | POA: Diagnosis not present

## 2020-01-17 DIAGNOSIS — E039 Hypothyroidism, unspecified: Secondary | ICD-10-CM | POA: Diagnosis not present

## 2020-01-17 DIAGNOSIS — L237 Allergic contact dermatitis due to plants, except food: Secondary | ICD-10-CM | POA: Diagnosis not present

## 2020-01-17 MED ORDER — EPINEPHRINE 0.3 MG/0.3ML IJ SOAJ: INTRAMUSCULAR | 1 refills | Status: DC

## 2020-01-17 MED ORDER — TRIAMCINOLONE ACETONIDE 0.1 % EX CREA: 1.0000 "application " | TOPICAL_CREAM | Freq: Two times a day (BID) | CUTANEOUS | 0 refills | Status: DC

## 2020-01-17 MED ORDER — ATORVASTATIN CALCIUM 20 MG PO TABS: 20.0000 mg | ORAL_TABLET | Freq: Every day | ORAL | 3 refills | Status: DC

## 2020-01-17 NOTE — Progress Notes (Signed)
BP 128/79   Pulse 86   Temp 98.1 F (36.7 C)   Ht '5\' 4"'$  (1.626 m)   Wt 211 lb (95.7 kg)   SpO2 98%   BMI 36.22 kg/m    Subjective:   Patient ID: Laurie Mckenzie, female    DOB: [birth year], 68 y.o.   MRN: [id number]  HPI: Laurie Mckenzie is a 69 y.o. female presenting on 01/17/2020 for Medical Management of Chronic Issues, Hypertension, and Hypothyroidism   HPI Hypertension Patient is currently on amlodipine, and their blood pressure today is 128/79. Patient denies any lightheadedness or dizziness. Patient denies headaches, blurred vision, chest pains, shortness of breath, or weakness. Denies any side effects from medication and is content with current medication.   Hypothyroidism recheck Patient is coming in for thyroid recheck today as well. They deny any issues with hair changes or heat or cold problems or diarrhea or constipation. They deny any chest pain or palpitations. They are currently on levothyroxine 150 micrograms   Hyperlipidemia and hypertriglyceridemia Patient is coming in for recheck of his hyperlipidemia. The patient is currently taking atorvastatin. They deny any issues with myalgias or history of liver damage from it. They deny any focal numbness or weakness or chest pain.  Patient has a small rash to start of poison ivy on her finger 1 spot and 1 spot on her left inner knee, she says it just started a couple days ago but has not really spread but she is having some itching  Relevant past medical, surgical, family and social history reviewed and updated as indicated. Interim medical history since our last visit reviewed. Allergies and medications reviewed and updated.  Review of Systems  Constitutional: Negative for chills and fever.  Eyes: Negative for visual disturbance.  Respiratory: Negative for chest tightness and shortness of breath.   Cardiovascular: Negative for chest pain and leg swelling.  Musculoskeletal: Negative for back pain and gait problem.  Skin:  Negative for rash.  Neurological: Negative for light-headedness and headaches.  Psychiatric/Behavioral: Negative for agitation and behavioral problems.  All other systems reviewed and are negative.   Per HPI unless specifically indicated above   Allergies as of 01/17/2020      Reactions   Sulfa Antibiotics Other (See Comments)   Bee Venom Swelling, Other (See Comments)   Red and hot to site   Fish Allergy Hives   Ivp Dye [iodinated Diagnostic Agents] Other (See Comments)   MD advised not to use, due to fish allergies      Medication List       Accurate as of January 17, 2020  8:17 AM. If you have any questions, ask your nurse or doctor.        STOP taking these medications   nitrofurantoin (macrocrystal-monohydrate) 100 MG capsule Commonly known as: Macrobid Stopped by: Worthy Rancher, MD     TAKE these medications   acetaminophen 500 MG tablet Commonly known as: TYLENOL Take 1,000-1,500 mg by mouth every 6 (six) hours as needed for moderate pain or headache.   amLODipine 2.5 MG tablet Commonly known as: NORVASC Take 1 tablet (2.5 mg total) by mouth daily.   atorvastatin 20 MG tablet Commonly known as: LIPITOR Take 1 tablet (20 mg total) by mouth daily.   CALCIUM 1200 PO Take 2 tablets by mouth daily.   EPINEPHrine 0.3 mg/0.3 mL Soaj injection Commonly known as: EPI-PEN Inject 0.3 mLs (0.3 mgttotal) into the muscleoonce for 1 dose.   levothyroxine 150 MCG  tablet Commonly known as: SYNTHROID Take 1 tablet (150 mcg total) by mouth daily.   montelukast 10 MG tablet Commonly known as: SINGULAIR Take 1 tablet (10 mg total) by mouth at bedtime.   multivitamin tablet Take 1 tablet by mouth daily.   VITAMIN D3 PO Take 1 capsule by mouth daily.        Objective:   BP 128/79   Pulse 86   Temp 98.1 F (36.7 C)   Ht '5\' 4"'$  (1.626 m)   Wt 211 lb (95.7 kg)   SpO2 98%   BMI 36.22 kg/m   Wt Readings from Last 3 Encounters:  01/17/20 211 lb (95.7 kg)    01/04/20 214 lb (97.1 kg)  07/20/19 201 lb (91.2 kg)    Physical Exam Vitals and nursing note reviewed.  Constitutional:      General: She is not in acute distress.    Appearance: She is well-developed. She is not diaphoretic.  Eyes:     Conjunctiva/sclera: Conjunctivae normal.  Cardiovascular:     Rate and Rhythm: Normal rate and regular rhythm.     Heart sounds: Normal heart sounds. No murmur heard.   Pulmonary:     Effort: Pulmonary effort is normal. No respiratory distress.     Breath sounds: Normal breath sounds. No wheezing.  Musculoskeletal:        General: No tenderness. Normal range of motion.  Skin:    General: Skin is warm and dry.     Findings: No rash.  Neurological:     Mental Status: She is alert and oriented to person, place, and time.     Coordination: Coordination normal.  Psychiatric:        Behavior: Behavior normal.     Results for orders placed or performed in visit on 01/15/20  CBC with Differential/Platelet  Result Value Ref Range   WBC 7.7 3.4 - 10.8 x10E3/uL   RBC 4.79 3.77 - 5.28 x10E6/uL   Hemoglobin 15.3 11.1 - 15.9 g/dL   Hematocrit 44.4 34.0 - 46.6 %   MCV 93 79 - 97 fL   MCH 31.9 26.6 - 33.0 pg   MCHC 34.5 31 - 35 g/dL   RDW 12.7 11.7 - 15.4 %   Platelets 243 150 - 450 x10E3/uL   Neutrophils 55 Not Estab. %   Lymphs 32 Not Estab. %   Monocytes 8 Not Estab. %   Eos 3 Not Estab. %   Basos 1 Not Estab. %   Neutrophils Absolute 4.2 1 - 7 x10E3/uL   Lymphocytes Absolute 2.5 0 - 3 x10E3/uL   Monocytes Absolute 0.6 0 - 0 x10E3/uL   EOS (ABSOLUTE) 0.3 0.0 - 0.4 x10E3/uL   Basophils Absolute 0.1 0 - 0 x10E3/uL   Immature Granulocytes 1 Not Estab. %   Immature Grans (Abs) 0.1 0.0 - 0.1 x10E3/uL  CMP14+EGFR  Result Value Ref Range   Glucose 104 (H) 65 - 99 mg/dL   BUN 11 8 - 27 mg/dL   Creatinine, Ser 0.75 0.57 - 1.00 mg/dL   GFR calc non Af Amer 82 >59 mL/min/1.73   GFR calc Af Amer 95 >59 mL/min/1.73   BUN/Creatinine Ratio 15 12 -  28   Sodium 139 134 - 144 mmol/L   Potassium 4.4 3.5 - 5.2 mmol/L   Chloride 101 96 - 106 mmol/L   CO2 24 20 - 29 mmol/L   Calcium 9.7 8.7 - 10.3 mg/dL   Total Protein 7.4 6.0 - 8.5 g/dL  Albumin 4.6 3.8 - 4.8 g/dL   Globulin, Total 2.8 1.5 - 4.5 g/dL   Albumin/Globulin Ratio 1.6 1.2 - 2.2   Bilirubin Total <0.2 0.0 - 1.2 mg/dL   Alkaline Phosphatase 128 (H) 48 - 121 IU/L   AST 20 0 - 40 IU/L   ALT 25 0 - 32 IU/L  Lipid panel  Result Value Ref Range   Cholesterol, Total 133 100 - 199 mg/dL   Triglycerides 301 (H) 0 - 149 mg/dL   HDL 35 (L) >39 mg/dL   VLDL Cholesterol Cal 47 (H) 5 - 40 mg/dL   LDL Chol Calc (NIH) 51 0 - 99 mg/dL   Chol/HDL Ratio 3.8 0.0 - 4.4 ratio  TSH  Result Value Ref Range   TSH 3.670 0.450 - 4.500 uIU/mL    Assessment & Plan:   Problem List Items Addressed This Visit      Cardiovascular and Mediastinum   Hypertension   Relevant Medications   atorvastatin (LIPITOR) 20 MG tablet   EPINEPHrine 0.3 mg/0.3 mL IJ SOAJ injection   Other Relevant Orders   CBC with Differential/Platelet   CMP14+EGFR     Endocrine   Hypothyroidism - Primary   Relevant Orders   TSH     Other   Hyperlipidemia   Relevant Medications   atorvastatin (LIPITOR) 20 MG tablet   EPINEPHrine 0.3 mg/0.3 mL IJ SOAJ injection   Other Relevant Orders   Lipid panel    Other Visit Diagnoses    Poison oak dermatitis       Relevant Medications   triamcinolone cream (KENALOG) 0.1 %   Postmenopausal       Relevant Orders   DG [facility] DEXA      Will order bone density scan for the patient, will refill medications, gave triamcinolone for her rash from poison ivy. Follow up plan: Return in about 6 months (around 07/19/2020), or if symptoms worsen or fail to improve, for Thyroid and hyperlipidemia recheck.  Counseling provided for all of the vaccine components No orders of the defined types were placed in this encounter.   Caryl Pina, MD [facility] 01/17/2020, 8:17 AM

## 2020-01-22 ENCOUNTER — Telehealth: Payer: Self-pay | Admitting: Family Medicine

## 2020-01-22 MED ORDER — PREDNISONE 20 MG PO TABS: ORAL_TABLET | ORAL | 0 refills | Status: DC

## 2020-01-22 NOTE — Telephone Encounter (Signed)
Prednisone rx sent to pharmacy Meds ordered this encounter  Medications   predniSONE (DELTASONE) 20 MG tablet    Sig: 2 po at sametime daily for 5 days    Dispense:  10 tablet    Refill:  0    Order Specific Question:   Supervising Provider    Answer:   Caryl Pina A [id number]

## 2020-01-22 NOTE — Telephone Encounter (Signed)
  Incoming Patient Call  01/22/2020  What symptoms do you have? Poison oak  How long have you been sick? Since last week  Have you been seen for this problem? Yes last week by Dr. Keturah Barre. And was told to call back if not better,she only had it on finger and knee, now it is on her back and arm and chin   If your provider decides to give you a prescription, which pharmacy would you like for it to be sent to? [facility], was told by Dr. Keturah Barre he would prescribe another medication    Patient informed that this information will be sent to the clinical staff for review and that they should receive a follow up call.

## 2020-01-22 NOTE — Telephone Encounter (Signed)
Left message that something was sent into pharmacy and to call back with any questions or concerns.

## 2020-02-14 DIAGNOSIS — Z1231 Encounter for screening mammogram for malignant neoplasm of breast: Secondary | ICD-10-CM | POA: Diagnosis not present

## 2020-03-26 ENCOUNTER — Ambulatory Visit (INDEPENDENT_AMBULATORY_CARE_PROVIDER_SITE_OTHER): Payer: Medicare Other | Admitting: *Deleted

## 2020-03-26 ENCOUNTER — Ambulatory Visit (INDEPENDENT_AMBULATORY_CARE_PROVIDER_SITE_OTHER): Payer: Medicare Other

## 2020-03-26 ENCOUNTER — Other Ambulatory Visit: Payer: Self-pay

## 2020-03-26 DIAGNOSIS — Z780 Asymptomatic menopausal state: Secondary | ICD-10-CM

## 2020-03-26 DIAGNOSIS — Z23 Encounter for immunization: Secondary | ICD-10-CM

## 2020-05-21 DIAGNOSIS — H938X1 Other specified disorders of right ear: Secondary | ICD-10-CM | POA: Diagnosis not present

## 2020-05-21 DIAGNOSIS — I10 Essential (primary) hypertension: Secondary | ICD-10-CM | POA: Diagnosis not present

## 2020-05-21 DIAGNOSIS — H6980 Other specified disorders of Eustachian tube, unspecified ear: Secondary | ICD-10-CM | POA: Diagnosis not present

## 2020-06-18 DIAGNOSIS — H35372 Puckering of macula, left eye: Secondary | ICD-10-CM | POA: Diagnosis not present

## 2020-06-18 DIAGNOSIS — H524 Presbyopia: Secondary | ICD-10-CM | POA: Diagnosis not present

## 2020-06-19 ENCOUNTER — Encounter: Payer: Self-pay | Admitting: *Deleted

## 2020-07-17 ENCOUNTER — Other Ambulatory Visit: Payer: Self-pay

## 2020-07-17 ENCOUNTER — Other Ambulatory Visit: Payer: Medicare Other

## 2020-07-17 DIAGNOSIS — E039 Hypothyroidism, unspecified: Secondary | ICD-10-CM

## 2020-07-17 DIAGNOSIS — E782 Mixed hyperlipidemia: Secondary | ICD-10-CM

## 2020-07-17 DIAGNOSIS — I10 Essential (primary) hypertension: Secondary | ICD-10-CM

## 2020-07-18 LAB — CBC WITH DIFFERENTIAL/PLATELET
Basophils Absolute: 0.1 10*3/uL (ref 0.0–0.2)
Basos: 1 %
EOS (ABSOLUTE): 0.2 10*3/uL (ref 0.0–0.4)
Eos: 2 %
Hematocrit: 45.6 % (ref 34.0–46.6)
Hemoglobin: 15.2 g/dL (ref 11.1–15.9)
Immature Grans (Abs): 0.1 10*3/uL (ref 0.0–0.1)
Immature Granulocytes: 1 %
Lymphocytes Absolute: 2.2 10*3/uL (ref 0.7–3.1)
Lymphs: 31 %
MCH: 30.8 pg (ref 26.6–33.0)
MCHC: 33.3 g/dL (ref 31.5–35.7)
MCV: 92 fL (ref 79–97)
Monocytes Absolute: 0.7 10*3/uL (ref 0.1–0.9)
Monocytes: 9 %
Neutrophils Absolute: 4 10*3/uL (ref 1.4–7.0)
Neutrophils: 56 %
Platelets: 246 10*3/uL (ref 150–450)
RBC: 4.94 x10E6/uL (ref 3.77–5.28)
RDW: 12.1 % (ref 11.7–15.4)
WBC: 7.2 10*3/uL (ref 3.4–10.8)

## 2020-07-18 LAB — LIPID PANEL
Chol/HDL Ratio: 3.3 ratio (ref 0.0–4.4)
Cholesterol, Total: 124 mg/dL (ref 100–199)
HDL: 38 mg/dL — ABNORMAL LOW (ref >39)
LDL Chol Calc (NIH): 55 mg/dL (ref 0–99)
Triglycerides: 190 mg/dL — ABNORMAL HIGH (ref 0–149)
VLDL Cholesterol Cal: 31 mg/dL (ref 5–40)

## 2020-07-18 LAB — CMP14+EGFR
ALT: 33 IU/L — ABNORMAL HIGH (ref 0–32)
AST: 25 IU/L (ref 0–40)
Albumin/Globulin Ratio: 1.7 (ref 1.2–2.2)
Albumin: 4.5 g/dL (ref 3.8–4.8)
Alkaline Phosphatase: 125 IU/L — ABNORMAL HIGH (ref 44–121)
BUN/Creatinine Ratio: 13 (ref 12–28)
BUN: 10 mg/dL (ref 8–27)
Bilirubin Total: 0.3 mg/dL (ref 0.0–1.2)
CO2: 24 mmol/L (ref 20–29)
Calcium: 9.7 mg/dL (ref 8.7–10.3)
Chloride: 100 mmol/L (ref 96–106)
Creatinine, Ser: 0.76 mg/dL (ref 0.57–1.00)
GFR calc Af Amer: 93 mL/min/{1.73_m2} (ref >59)
GFR calc non Af Amer: 80 mL/min/{1.73_m2} (ref >59)
Globulin, Total: 2.7 g/dL (ref 1.5–4.5)
Glucose: 105 mg/dL — ABNORMAL HIGH (ref 65–99)
Potassium: 4.3 mmol/L (ref 3.5–5.2)
Sodium: 141 mmol/L (ref 134–144)
Total Protein: 7.2 g/dL (ref 6.0–8.5)

## 2020-07-18 LAB — TSH: TSH: 3.12 u[IU]/mL (ref 0.450–4.500)

## 2020-07-19 ENCOUNTER — Encounter: Payer: Self-pay | Admitting: Family Medicine

## 2020-07-19 ENCOUNTER — Other Ambulatory Visit: Payer: Self-pay

## 2020-07-19 ENCOUNTER — Ambulatory Visit (INDEPENDENT_AMBULATORY_CARE_PROVIDER_SITE_OTHER): Payer: Medicare Other | Admitting: Family Medicine

## 2020-07-19 VITALS — BP 163/87 | HR 92 | Temp 97.7°F | Resp 20 | Ht 64.0 in | Wt 219.0 lb

## 2020-07-19 DIAGNOSIS — E039 Hypothyroidism, unspecified: Secondary | ICD-10-CM

## 2020-07-19 DIAGNOSIS — I10 Essential (primary) hypertension: Secondary | ICD-10-CM | POA: Diagnosis not present

## 2020-07-19 DIAGNOSIS — E782 Mixed hyperlipidemia: Secondary | ICD-10-CM

## 2020-07-19 DIAGNOSIS — Z23 Encounter for immunization: Secondary | ICD-10-CM

## 2020-07-19 MED ORDER — ATORVASTATIN CALCIUM 20 MG PO TABS: 20.0000 mg | ORAL_TABLET | Freq: Every day | ORAL | 3 refills | Status: DC

## 2020-07-19 MED ORDER — LEVOTHYROXINE SODIUM 150 MCG PO TABS: 150.0000 ug | ORAL_TABLET | Freq: Every day | ORAL | 3 refills | Status: DC

## 2020-07-19 MED ORDER — MONTELUKAST SODIUM 10 MG PO TABS: 10.0000 mg | ORAL_TABLET | Freq: Every day | ORAL | 3 refills | Status: DC

## 2020-07-19 MED ORDER — EPINEPHRINE 0.3 MG/0.3ML IJ SOAJ: INTRAMUSCULAR | 1 refills | Status: DC

## 2020-07-19 MED ORDER — AMLODIPINE BESYLATE 2.5 MG PO TABS: 2.5000 mg | ORAL_TABLET | Freq: Every day | ORAL | 3 refills | Status: DC

## 2020-07-19 NOTE — Progress Notes (Signed)
BP (!) 163/87 Comment: left arm   Pulse 92    Temp 97.7 F (36.5 C)    Resp 20    Ht $R'5\' 4"'zs$  (1.626 m)    Wt 219 lb (99.3 kg)    SpO2 93%    BMI 37.59 kg/m    Subjective:   Patient ID: Laurie Mckenzie, female    DOB: 04/30/1951, 70 y.o.   MRN: [id number]  HPI: Laurie Mckenzie is a 70 y.o. female presenting on 07/19/2020 for Medical Management of Chronic Issues (6 mo ), Hypertension, Hypothyroidism, and Hyperlipidemia   HPI Hypothyroidism recheck Patient is coming in for thyroid recheck today as well. They deny any issues with hair changes or heat or cold problems or diarrhea or constipation. They deny any chest pain or palpitations. They are currently on levothyroxine 159micrograms   Hyperlipidemia Patient is coming in for recheck of his hyperlipidemia. The patient is currently taking atorvastatin. They deny any issues with myalgias or history of liver damage from it. They deny any focal numbness or weakness or chest pain.   Hypertension Patient is currently on amlodipine, and their blood pressure today is 163/87. Patient denies any lightheadedness or dizziness. Patient denies headaches, blurred vision, chest pains, shortness of breath, or weakness. Denies any side effects from medication and is content with current medication.   Relevant past medical, surgical, family and social history reviewed and updated as indicated. Interim medical history since our last visit reviewed. Allergies and medications reviewed and updated.  Review of Systems  Constitutional: Negative for chills and fever.  Eyes: Negative for visual disturbance.  Respiratory: Negative for chest tightness and shortness of breath.   Cardiovascular: Negative for chest pain and leg swelling.  Musculoskeletal: Negative for back pain and gait problem.  Skin: Negative for rash.  Neurological: Negative for light-headedness and headaches.  Psychiatric/Behavioral: Negative for agitation and behavioral problems.  All other systems  reviewed and are negative.   Per HPI unless specifically indicated above   Allergies as of 07/19/2020      Reactions   Sulfa Antibiotics Other (See Comments)   Bee Venom Swelling, Other (See Comments)   Red and hot to site   Fish Allergy Hives   Ivp Dye [iodinated Diagnostic Agents] Other (See Comments)   MD advised not to use, due to fish allergies      Medication List       Accurate as of July 19, 2020  9:14 AM. If you have any questions, ask your nurse or doctor.        STOP taking these medications   predniSONE 20 MG tablet Commonly known as: Deltasone Stopped by: Fransisca Kaufmann Nicolena Schurman, MD     TAKE these medications   acetaminophen 500 MG tablet Commonly known as: TYLENOL Take 1,000-1,500 mg by mouth every 6 (six) hours as needed for moderate pain or headache.   amLODipine 2.5 MG tablet Commonly known as: NORVASC Take 1 tablet (2.5 mg total) by mouth daily.   atorvastatin 20 MG tablet Commonly known as: LIPITOR Take 1 tablet (20 mg total) by mouth daily.   CALCIUM 1200 PO Take 2 tablets by mouth daily.   EPINEPHrine 0.3 mg/0.3 mL Soaj injection Commonly known as: EPI-PEN Inject 0.3 mLs (0.3 mgttotal) into the muscleoonce for 1 dose.   levothyroxine 150 MCG tablet Commonly known as: SYNTHROID Take 1 tablet (150 mcg total) by mouth daily.   montelukast 10 MG tablet Commonly known as: SINGULAIR Take 1 tablet (10 mg  total) by mouth at bedtime.   multivitamin tablet Take 1 tablet by mouth daily.   triamcinolone 0.1 % Commonly known as: KENALOG Apply 1 application topically 2 (two) times daily.   VITAMIN D3 PO Take 1 capsule by mouth daily.        Objective:   BP (!) 163/87 Comment: left arm   Pulse 92    Temp 97.7 F (36.5 C)    Resp 20    Ht $R'5\' 4"'YO$  (1.626 m)    Wt 219 lb (99.3 kg)    SpO2 93%    BMI 37.59 kg/m   Wt Readings from Last 3 Encounters:  07/19/20 219 lb (99.3 kg)  01/17/20 211 lb (95.7 kg)  01/04/20 214 lb (97.1 kg)    Physical  Exam Vitals and nursing note reviewed.  Constitutional:      General: She is not in acute distress.    Appearance: She is well-developed and well-nourished. She is not diaphoretic.  Eyes:     Extraocular Movements: EOM normal.     Conjunctiva/sclera: Conjunctivae normal.  Cardiovascular:     Rate and Rhythm: Normal rate and regular rhythm.     Pulses: Intact distal pulses.     Heart sounds: Normal heart sounds. No murmur heard.   Pulmonary:     Effort: Pulmonary effort is normal. No respiratory distress.     Breath sounds: Normal breath sounds. No wheezing.  Musculoskeletal:        General: No tenderness or edema. Normal range of motion.  Skin:    General: Skin is warm and dry.     Findings: No rash.  Neurological:     Mental Status: She is alert and oriented to person, place, and time.     Coordination: Coordination normal.  Psychiatric:        Mood and Affect: Mood and affect normal.        Behavior: Behavior normal.     Results for orders placed or performed in visit on 07/17/20  TSH  Result Value Ref Range   TSH 3.120 0.450 - 4.500 uIU/mL  Lipid panel  Result Value Ref Range   Cholesterol, Total 124 100 - 199 mg/dL   Triglycerides 190 (H) 0 - 149 mg/dL   HDL 38 (L) >39 mg/dL   VLDL Cholesterol Cal 31 5 - 40 mg/dL   LDL Chol Calc (NIH) 55 0 - 99 mg/dL   Chol/HDL Ratio 3.3 0.0 - 4.4 ratio  CMP14+EGFR  Result Value Ref Range   Glucose 105 (H) 65 - 99 mg/dL   BUN 10 8 - 27 mg/dL   Creatinine, Ser 0.76 0.57 - 1.00 mg/dL   GFR calc non Af Amer 80 >59 mL/min/1.73   GFR calc Af Amer 93 >59 mL/min/1.73   BUN/Creatinine Ratio 13 12 - 28   Sodium 141 134 - 144 mmol/L   Potassium 4.3 3.5 - 5.2 mmol/L   Chloride 100 96 - 106 mmol/L   CO2 24 20 - 29 mmol/L   Calcium 9.7 8.7 - 10.3 mg/dL   Total Protein 7.2 6.0 - 8.5 g/dL   Albumin 4.5 3.8 - 4.8 g/dL   Globulin, Total 2.7 1.5 - 4.5 g/dL   Albumin/Globulin Ratio 1.7 1.2 - 2.2   Bilirubin Total 0.3 0.0 - 1.2 mg/dL    Alkaline Phosphatase 125 (H) 44 - 121 IU/L   AST 25 0 - 40 IU/L   ALT 33 (H) 0 - 32 IU/L  CBC with Differential/Platelet  Result  Value Ref Range   WBC 7.2 3.4 - 10.8 x10E3/uL   RBC 4.94 3.77 - 5.28 x10E6/uL   Hemoglobin 15.2 11.1 - 15.9 g/dL   Hematocrit 45.6 34.0 - 46.6 %   MCV 92 79 - 97 fL   MCH 30.8 26.6 - 33.0 pg   MCHC 33.3 31.5 - 35.7 g/dL   RDW 12.1 11.7 - 15.4 %   Platelets 246 150 - 450 x10E3/uL   Neutrophils 56 Not Estab. %   Lymphs 31 Not Estab. %   Monocytes 9 Not Estab. %   Eos 2 Not Estab. %   Basos 1 Not Estab. %   Neutrophils Absolute 4.0 1.4 - 7.0 x10E3/uL   Lymphocytes Absolute 2.2 0.7 - 3.1 x10E3/uL   Monocytes Absolute 0.7 0.1 - 0.9 x10E3/uL   EOS (ABSOLUTE) 0.2 0.0 - 0.4 x10E3/uL   Basophils Absolute 0.1 0.0 - 0.2 x10E3/uL   Immature Granulocytes 1 Not Estab. %   Immature Grans (Abs) 0.1 0.0 - 0.1 x10E3/uL    Assessment & Plan:   Problem List Items Addressed This Visit      Cardiovascular and Mediastinum   Hypertension   Relevant Medications   atorvastatin (LIPITOR) 20 MG tablet   amLODipine (NORVASC) 2.5 MG tablet   EPINEPHrine 0.3 mg/0.3 mL IJ SOAJ injection     Endocrine   Hypothyroidism - Primary   Relevant Medications   levothyroxine (SYNTHROID) 150 MCG tablet     Other   Hyperlipidemia   Relevant Medications   atorvastatin (LIPITOR) 20 MG tablet   amLODipine (NORVASC) 2.5 MG tablet   EPINEPHrine 0.3 mg/0.3 mL IJ SOAJ injection      Continue current medication, she will monitor blood pressures at home a little more closely.  She does get whitecoat syndrome.  All of her blood work looks good, no change in medication. Follow up plan: Return in about 6 months (around 01/16/2021), or if symptoms worsen or fail to improve, for htn and thyroid and hld.  Counseling provided for all of the vaccine components No orders of the defined types were placed in this encounter.   Caryl Pina, MD [facility] 07/19/2020, 9:14 AM

## 2020-09-30 ENCOUNTER — Other Ambulatory Visit: Payer: Self-pay | Admitting: Family Medicine

## 2020-12-04 ENCOUNTER — Ambulatory Visit (INDEPENDENT_AMBULATORY_CARE_PROVIDER_SITE_OTHER): Payer: Medicare Other

## 2020-12-04 DIAGNOSIS — Z0000 Encounter for general adult medical examination without abnormal findings: Secondary | ICD-10-CM

## 2020-12-04 NOTE — Progress Notes (Signed)
MEDICARE ANNUAL WELLNESS VISIT  12/04/2020  Telephone Visit Disclaimer This Medicare AWV was conducted by telephone due to national recommendations for restrictions regarding the COVID-19 Pandemic (e.g. social distancing).  I verified, using two identifiers, that I am speaking with Laurie Mckenzie or their authorized healthcare agent. I discussed the limitations, risks, security, and privacy concerns of performing an evaluation and management service by telephone and the potential availability of an in-person appointment in the future. The patient expressed understanding and agreed to proceed.  Location of Patient: Home Location of Provider (nurse):  [facility]  Subjective:    Laurie Mckenzie is a 70 y.o. female patient of Dettinger, Laurie Kaufmann, MD who had a Medicare Annual Wellness Visit today via telephone. [name] is Retired and lives alone. She has no children. She reports that she is socially active and does interact with friends/family regularly. She is minimally physically active and enjoys watching golf, collecting baseball cards, and watching any type of sport.  Patient Care Team: Dettinger, Laurie Kaufmann, MD as PCP - General (Family Medicine)  Advanced Directives 12/04/2020 08/12/2017 01/16/2015  Does Patient Have a Medical Advance Directive? No No No  Would patient like information on creating a medical advance directive? No - Patient declined No - Patient declined No - patient declined information    Hospital Utilization Over the Past 12 Months: # of hospitalizations or ER visits: 0 # of surgeries: 0  Review of Systems    Patient reports that her overall health is unchanged compared to last year.  History obtained from the patient  Patient Reported Readings (BP, Pulse, CBG, Weight, etc) none  Pain Assessment Pain : No/denies pain     Current Medications & Allergies (verified) Allergies as of 12/04/2020      Reactions   Sulfa Antibiotics Other (See Comments)   Bee Venom Swelling,  Other (See Comments)   Red and hot to site   Fish Allergy Hives   Ivp Dye [iodinated Diagnostic Agents] Other (See Comments)   MD advised not to use, due to fish allergies      Medication List       Accurate as of December 04, 2020  9:26 AM. If you have any questions, ask your nurse or doctor.        STOP taking these medications   CALCIUM 1200 PO     TAKE these medications   acetaminophen 500 MG tablet Commonly known as: TYLENOL Take 1,000-1,500 mg by mouth every 6 (six) hours as needed for moderate pain or headache.   amLODipine 2.5 MG tablet Commonly known as: NORVASC Take 1 tablet (2.5 mg total) by mouth daily.   atorvastatin 20 MG tablet Commonly known as: LIPITOR Take 1 tablet (20 mg total) by mouth daily.   calcium carbonate 1500 (600 Ca) MG Tabs tablet Commonly known as: OSCAL Take 600 mg by mouth 2 (two) times daily with a meal.   EPINEPHrine 0.3 mg/0.3 mL Soaj injection Commonly known as: EPI-PEN Inject 0.3 mLs (0.3 mgttotal) into the muscleoonce for 1 dose.   levothyroxine 150 MCG tablet Commonly known as: SYNTHROID Take 1 tablet (150 mcg total) by mouth daily.   montelukast 10 MG tablet Commonly known as: SINGULAIR TAKE 1 TABLET BY MOUTH AT BEDTIME.   multivitamin tablet Take 1 tablet by mouth daily.   triamcinolone cream 0.1 % Commonly known as: KENALOG Apply 1 application topically 2 (two) times daily.   VITAMIN D3 PO Take 1 capsule by mouth daily.  History (reviewed): Past Medical History:  Diagnosis Date  . Headache   . Hypertension    186/106 today    195/108 07/27/2017  . Hypothyroidism   . Primary osteoarthritis of knee    Right   Past Surgical History:  Procedure Laterality Date  . COLONOSCOPY N/A 01/16/2015   Procedure: COLONOSCOPY;  Surgeon: Daneil Dolin, MD;  [facility];  Service: Endoscopy;  Laterality: N/A;  8:30 AM  . KNEE ARTHROPLASTY Left 2011   [place]  . TOTAL KNEE ARTHROPLASTY Right 08/23/2017    Procedure: TOTAL KNEE ARTHROPLASTY;  Surgeon: Elsie Saas, MD;  Location: [place];  Service: Orthopedics;  Laterality: Right;   Family History  Problem Relation Age of Onset  . Hypertension Mother   . Lung disease Father    Social History   Socioeconomic History  . Marital status: Single    Spouse name: Not on file  . Number of children: Not on file  . Years of education: Not on file  . Highest education level: Not on file  Occupational History  . Not on file  Tobacco Use  . Smoking status: Never Smoker  . Smokeless tobacco: Never Used  Vaping Use  . Vaping Use: Never used  Substance and Sexual Activity  . Alcohol use: No  . Drug use: No  . Sexual activity: Not Currently    Comment: not in relationship currently  Other Topics Concern  . Not on file  Social History Narrative  . Not on file   Social Determinants of Health   Financial Resource Strain: Not on file  Food Insecurity: Not on file  Transportation Needs: Not on file  Physical Activity: Not on file  Stress: Not on file  Social Connections: Not on file    Activities of Daily Living In your present state of health, do you have any difficulty performing the following activities: 12/04/2020  Hearing? N  Vision? N  Difficulty concentrating or making decisions? N  Walking or climbing stairs? N  Dressing or bathing? N  Doing errands, shopping? N  Preparing Food and eating ? N  Using the Toilet? N  In the past six months, have you accidently leaked urine? N  Do you have problems with loss of bowel control? N  Managing your Medications? N  Managing your Finances? N  Housekeeping or managing your Housekeeping? N  Some recent data might be hidden    Patient Education/ Literacy How often do you need to have someone help you when you read instructions, pamphlets, or other written materials from your doctor or pharmacy?: 1 - Never What is the last grade level you completed in school?: 12th  grade  Exercise Current Exercise Habits: The patient does not participate in regular exercise at present, Exercise limited by: orthopedic condition(s)  Diet Patient reports consuming 3 meals a day and 1 snack(s) a day Patient reports that her primary diet is: Regular Patient reports that she does have regular access to food.   Depression Screen PHQ 2/9 Scores 07/19/2020 01/17/2020 01/04/2020 07/20/2019 01/17/2019  PHQ - 2 Score 0 0 0 0 0     Fall Risk Fall Risk  12/04/2020 07/19/2020 01/17/2020 01/04/2020 07/20/2019  Falls in the past year? 0 0 0 0 0     Objective:  Laurie Mckenzie seemed alert and oriented and she participated appropriately during our telephone visit.  Blood Pressure Weight BMI  BP Readings from Last 3 Encounters:  07/19/20 (!) 163/87  01/17/20 128/79  01/04/20 (!) 171/84   Wt Readings from Last 3 Encounters:  07/19/20 219 lb (99.3 kg)  01/17/20 211 lb (95.7 kg)  01/04/20 214 lb (97.1 kg)   BMI Readings from Last 1 Encounters:  07/19/20 37.59 kg/m    *Unable to obtain current vital signs, weight, and BMI due to telephone visit type  Hearing/Vision  . Kaziyah did not seem to have difficulty with hearing/understanding during the telephone conversation . Reports that she has not had a formal eye exam by an eye care professional within the past year . Reports that she has not had a formal hearing evaluation within the past year *Unable to fully assess hearing and vision during telephone visit type  Cognitive Function: 6CIT Screen 12/04/2020  What Year? 0 points  What month? 0 points  What time? 0 points  Count back from 20 0 points  Months in reverse 0 points  Repeat phrase 0 points  Total Score 0   (Normal:0-7, Significant for Dysfunction: >8)  Normal Cognitive Function Screening: Yes   Immunization & Health Maintenance Record Immunization History  Administered Date(s) Administered  . Fluad Quad(high Dose 65+) 03/21/2019, 03/26/2020  . Influenza Split  03/29/2013  . Influenza, High Dose Seasonal PF 04/15/2017, 04/18/2018  . Influenza,inj,Quad PF,6+ Mos 04/14/2016  . Influenza,trivalent, recombinat, inj, PF 04/26/2014  . Influenza-Unspecified 04/15/2015, 04/15/2017, 04/18/2018  . Moderna Sars-Covid-2 Vaccination 08/10/2019, 09/08/2019, 04/23/2020  . Pneumococcal Conjugate-13 10/13/2016  . Pneumococcal Polysaccharide-23 02/20/2012, 10/14/2017  . Tdap 02/19/2010, 07/19/2020  . Zoster Recombinat (Shingrix) 04/24/2017, 08/14/2017  . Zoster, Live 02/20/2012    Health Maintenance  Topic Date Due  . INFLUENZA VACCINE  01/27/2021  . MAMMOGRAM  02/13/2022  . COLONOSCOPY (Pts 45-58yrs Insurance coverage will need to be confirmed)  01/15/2025  . TETANUS/TDAP  07/19/2030  . DEXA SCAN  Completed  . COVID-19 Vaccine  Completed  . Hepatitis C Screening  Completed  . PNA vac Low Risk Adult  Completed  . Zoster Vaccines- Shingrix  Completed  . Pneumococcal Vaccine 42-75 Years old  Aged Out  . HPV VACCINES  Aged Out       Assessment  This is a routine wellness examination for Laurie Mckenzie.  Health Maintenance: Due or Overdue There are no preventive care reminders to display for this patient.  Laurie Mckenzie does not need a referral for Community Assistance: Care Management:   no Social Work:    no Prescription Assistance:  no Nutrition/Diabetes Education:  no   Plan:  Personalized Goals Goals Addressed            This Visit's Progress   . Patient Stated       12/04/2020 AWV Goal: Exercise for General Health   Patient will verbalize understanding of the benefits of increased physical activity:  Exercising regularly is important. It will improve your overall fitness, flexibility, and endurance.  Regular exercise also will improve your overall health. It can help you control your weight, reduce stress, and improve your bone density.  Over the next year, patient will increase physical activity as tolerated with a goal of at least  150 minutes of moderate physical activity per week.   You can tell that you are exercising at a moderate intensity if your heart starts beating faster and you start breathing faster but can still hold a conversation.  Moderate-intensity exercise ideas include:  Walking 1 mile (1.6 km) in about 15 minutes  Biking  Hiking  Golfing  Dancing  Water aerobics  Patient  will verbalize understanding of everyday activities that increase physical activity by providing examples like the following: ? Yard work, such as: ? Pushing a Conservation officer, nature ? Raking and bagging leaves ? Washing your car ? Pushing a stroller ? Shoveling snow ? Gardening ? Washing windows or floors  Patient will be able to explain general safety guidelines for exercising:   Before you start a new exercise program, talk with your health care provider.  Do not exercise so much that you hurt yourself, feel dizzy, or get very short of breath.  Wear comfortable clothes and wear shoes with good support.  Drink plenty of water while you exercise to prevent dehydration or heat stroke.  Work out until your breathing and your heartbeat get faster.       Personalized Health Maintenance & Screening Recommendations  Screening mammography  Lung Cancer Screening Recommended: no (Low Dose CT Chest recommended if Age 65-80 years, 30 pack-year currently smoking OR have quit w/in past 15 years) Hepatitis C Screening recommended: no HIV Screening recommended: no  Advanced Directives: Written information was not prepared per patient's request.  Referrals & Orders No orders of the defined types were placed in this encounter.   Follow-up Plan . Follow-up with Dettinger, Laurie Kaufmann, MD as planned . Schedule mammogram   I have personally reviewed and noted the following in the patient's chart:   . Medical and social history . Use of alcohol, tobacco or illicit drugs  . Current medications and supplements . Functional ability  and status . Nutritional status . Physical activity . Advanced directives . List of other physicians . Hospitalizations, surgeries, and ER visits in previous 12 months . Vitals . Screenings to include cognitive, depression, and falls . Referrals and appointments  In addition, I have reviewed and discussed with Laurie Media A Byrnes certain preventive protocols, quality metrics, and best practice recommendations. A written personalized care plan for preventive services as well as general preventive health recommendations is available and can be mailed to the patient at her request.      Felicity Coyer, LPN    1/0/6269

## 2020-12-09 ENCOUNTER — Encounter: Payer: Self-pay | Admitting: Family

## 2020-12-09 ENCOUNTER — Ambulatory Visit (INDEPENDENT_AMBULATORY_CARE_PROVIDER_SITE_OTHER): Payer: Medicare Other | Admitting: Family

## 2020-12-09 ENCOUNTER — Other Ambulatory Visit: Payer: Self-pay

## 2020-12-09 VITALS — BP 159/82 | HR 93 | Temp 98.7°F | Ht 64.0 in | Wt 222.0 lb

## 2020-12-09 DIAGNOSIS — L255 Unspecified contact dermatitis due to plants, except food: Secondary | ICD-10-CM

## 2020-12-09 MED ORDER — TRIAMCINOLONE ACETONIDE 0.1 % EX CREA: 1.0000 "application " | TOPICAL_CREAM | Freq: Two times a day (BID) | CUTANEOUS | 0 refills | Status: DC

## 2020-12-09 MED ORDER — PREDNISONE 10 MG (21) PO TBPK: ORAL_TABLET | ORAL | 0 refills | Status: DC

## 2020-12-09 MED ORDER — BETAMETHASONE SOD PHOS & ACET 30 MG/5ML IJ KIT
6.0000 mg | PACK | Freq: Once | INTRAMUSCULAR | Status: AC
  Administered 2020-12-09: 10:00:00 6 mg via INTRAMUSCULAR

## 2020-12-09 NOTE — Progress Notes (Signed)
   Subjective:    Patient ID: Laurie Mckenzie, female    DOB: [birth year], 70 y.o.   MRN: [id number]  Chief Complaint  Patient presents with   Poison Ivy    Finger, stomach, face,    Poison Karlene Einstein This is a new problem. The current episode started in the past 7 days. The problem has been gradually worsening since onset. The affected locations include the abdomen, face and right lower leg. The rash is characterized by blistering, itchiness and redness. She was exposed to plant contact. Past treatments include anti-itch cream. The treatment provided mild relief.     Review of Systems  All other systems reviewed and are negative.     Objective:   Physical Exam Vitals reviewed.  Constitutional:      General: She is not in acute distress.    Appearance: She is well-developed.  HENT:     Head: Normocephalic and atraumatic.     Right Ear: Tympanic membrane normal.     Left Ear: Tympanic membrane normal.  Eyes:     Pupils: Pupils are equal, round, and reactive to light.  Neck:     Thyroid: No thyromegaly.  Cardiovascular:     Rate and Rhythm: Normal rate and regular rhythm.     Heart sounds: Normal heart sounds. No murmur heard. Pulmonary:     Effort: Pulmonary effort is normal. No respiratory distress.     Breath sounds: Normal breath sounds. No wheezing.  Abdominal:     General: Bowel sounds are normal. There is no distension.     Palpations: Abdomen is soft.     Tenderness: There is no abdominal tenderness.  Musculoskeletal:        General: No tenderness. Normal range of motion.     Cervical back: Normal range of motion and neck supple.  Skin:    General: Skin is warm and dry.     Findings: Erythema and rash present.          Comments: Erythemas papule rash on face under left eye and abdomen  Neurological:     Mental Status: She is alert and oriented to person, place, and time.     Cranial Nerves: No cranial nerve deficit.     Deep Tendon Reflexes: Reflexes are normal and  symmetric.  Psychiatric:        Behavior: Behavior normal.        Thought Content: Thought content normal.        Judgment: Judgment normal.      BP (!) 159/82   Pulse 93   Temp 98.7 F (37.1 C) (Temporal)   Ht $R'5\' 4"'JP$  (1.626 m)   Wt 222 lb (100.7 kg)   BMI 38.11 kg/m      Assessment & Plan:   Laurie Mckenzie comes in today with chief complaint of Poison Ivy (Finger, stomach, face,)   Diagnosis and orders addressed:  1. Contact dermatitis due to plant -Do not scratch -Wear protective clothing while outside- Long sleeves and long pants -Take a shower as soon as possible after being outside -Report any s/s of infection - predniSONE (STERAPRED UNI-PAK 21 TAB) 10 MG (21) TBPK tablet; Use as directed  Dispense: 21 tablet; Refill: 0 - triamcinolone cream (KENALOG) 0.1 %; Apply 1 application topically 2 (two) times daily.  Dispense: 30 g; Refill: 0 - Betamethasone Sod Phos & Acet 30 MG/5ML KIT 6 mg   Evelina Dun, FNP

## 2020-12-09 NOTE — Patient Instructions (Signed)
Poison Oak Dermatitis  Poison oak dermatitis is inflammation of the skin that is caused by contact with the chemicals in the leaves of the poison oak (Toxicodendron) plant. The skin reaction often includes redness, swelling, blisters, andextreme itching. What are the causes? This condition is caused by a specific chemical (urushiol) that is found in the sap of the poison oak plant. This chemical is sticky and can be easily spread to people, animals, and objects. You can get poison oak dermatitis by: Having direct contact with a poison oak plant. Touching animals, other people, or objects that have come in contact with poison oak and have the chemical on them. What increases the risk? This condition is more likely to develop in people who: Are outdoors often in wooded or [place] areas. Go outdoors without wearing protective clothing, such as closed shoes, long pants, and a long-sleeved shirt. What are the signs or symptoms? Symptoms of this condition include: Redness of the skin. Extreme itching. A rash that often includes bumps and blisters. The rash usually appears 48 hours after exposure if you have been exposed before. If this is the first time you have been exposed, the rash may not appear until a week after exposure. Swelling. This may occur if the reaction is more severe. Symptoms usually last for 1-2 weeks. However, the first time you develop thiscondition, symptoms may last 3-4 weeks. How is this diagnosed? This condition may be diagnosed based on your symptoms and a physical exam.Your health care provider may also ask you about any recent outdoor activity. How is this treated? Treatment for this condition will vary depending on how severe it is. Treatment may include: Hydrocortisone creams or calamine lotions to relieve itching. Oatmeal baths to soothe the skin. Over-the-counter antihistamine medicines to help reduce itching. Steroid medicine taken by mouth (orally) for more severe  reactions. Follow these instructions at home: Medicines Take or apply over-the-counter and prescription medicines only as told by your health care provider. Use hydrocortisone creams or calamine lotion as needed to soothe the skin and relieve itching. General instructions Do not scratch or rub your skin. Apply a cold, wet cloth (cold compress) to the affected areas or take baths in cool water. This will help with itching. Avoid hot baths and showers. Take oatmeal baths as needed. Use colloidal oatmeal. You can get this at your local pharmacy or grocery store. Follow the instructions on the packaging. While you have the rash, wash clothes right after you wear them. Keep all follow-up visits as told by your health care provider. This is important. How is this prevented?  Learn to identify the poison oak plant and avoid contact with the plant. This plant can be recognized by the number of leaves. Generally, poison oak has three leaves with flowering branches on a single stem. The leaves are often a bit fuzzy and have a toothlike edge. If you have been exposed to poison oak, thoroughly wash your skin with soap and water right away. You have about 30 minutes to remove the plant resin before it will cause the rash. Be sure to wash under your fingernails because any plant resin there will continue to spread the rash. When hiking or camping, wear clothes that will help you avoid exposure on the skin. This includes long pants, a long-sleeved shirt, tall socks, and hiking boots. You can also apply preventive lotion to your skin to help limit exposure. If you suspect that your clothes or outdoor gear came in contact with poison oak,  rinse them off outside with a garden hose before bringing them inside your house. When doing yard work or gardening, wear gloves, long sleeves, long pants, and boots. Wash your garden tools and gloves if they come in contact with poison oak. If you suspect that your pet has come  into contact with poison oak, wash him or her with pet shampoo and water. Make sure you wear gloves while washing your pet. Do not burn poison oak plants. This can release the chemical from the plant into the air and may cause a reaction on the skin or eyes, or in the lungs from breathing in the smoke. Contact a health care provider if you have: Open sores in the rash area. More redness, swelling, or pain in the affected area. Redness that spreads beyond the rash area. Fluid, blood, or pus coming from the affected area. A fever. A rash over a large area of your body. A rash on your eyes, mouth, or genitals. A rash that does not improve after a few weeks. Get help right away if: Your face swells or your eyes swell shut. You have trouble breathing. You have trouble swallowing. These symptoms may represent a serious problem that is an emergency. Do not wait to see if the symptoms will go away. Get medical help right away. Call your local emergency services (911 in the [place] Do not drive yourself to the hospital. Summary Poison oak dermatitis is inflammation of the skin that is caused by contact with the chemicals on the leaves of the poison oak (Toxicodendron) plant. Symptoms of this condition include redness, extreme itching, a rash, and swelling. Do not scratch or rub your skin. Take or apply over-the-counter and prescription medicines only as told by your health care provider. This information is not intended to replace advice given to you by your health care provider. Make sure you discuss any questions you have with your healthcare provider. Document Revised: 10/07/2018 Document Reviewed: 07/15/2018 Elsevier Patient Education  [place].

## 2020-12-24 ENCOUNTER — Telehealth: Payer: Self-pay | Admitting: Family

## 2020-12-24 DIAGNOSIS — H35373 Puckering of macula, bilateral: Secondary | ICD-10-CM | POA: Diagnosis not present

## 2020-12-24 MED ORDER — PREDNISONE 10 MG PO TABS
ORAL_TABLET | ORAL | 0 refills | Status: DC
Start: 2020-12-24 — End: 2021-01-16

## 2020-12-24 NOTE — Telephone Encounter (Signed)
Pt called stating that she was seen by Alyse Low recently for [facility] was given a shot and prescribed some medicine to take for it. Pt says the medicine helped clear it up but says now it is back. Needs refill on Rx.  Please advise and call patient. [phone]

## 2020-12-24 NOTE — Telephone Encounter (Signed)
Long dose prednisone  Prescription sent to pharmacy.

## 2020-12-24 NOTE — Telephone Encounter (Signed)
Patient was seen 6/13- does she need another appointment ?

## 2020-12-24 NOTE — Telephone Encounter (Signed)
Patient aware and verbalizes understanding. 

## 2020-12-26 NOTE — Progress Notes (Addendum)
[facility] Note  01/01/2021     CHIEF COMPLAINT Patient presents for Retina Evaluation   HISTORY OF PRESENT ILLNESS: Laurie Mckenzie is a 70 y.o. female who presents to the clinic today for:   HPI     Retina Evaluation   In both eyes.  This started 1 year ago.  Duration of 1 year.  Associated Symptoms Floaters.  Negative for Flashes, Distortion, Blind Spot, Pain, Redness, Photophobia, Glare, Trauma, Scalp Tenderness, Jaw Claudication, Shoulder/Hip pain, Fever, Weight Loss and Fatigue.  Context:  near vision and reading.  Treatments tried include no treatments.  I, the attending physician,  performed the HPI with the patient and updated documentation appropriately.        Comments   Retinal evaluation per Dr. Rosana Hoes for ERM OU and VMT OS. Patient states doesn't see as well at night, especially for reading for about a year. Patient states she has floaters OU. Floaters are not new. No flashes. Patient's mother had surgery to remove retinal membrane.       Last edited by Bernarda Caffey, MD on 01/01/2021  9:30 AM.    Pt is here on the referral of Dr. Rosana Hoes for concern of ERM OS, pt states Dr. Radford Pax told her several years ago that she has a "wrinkle", pt states her vision seems okay, except she feels like she needs more light to read, pt states her mother had ERM sx and ARMD, pt denies being diabetic, but does take medication for BP  Referring physician: Leticia Clas, [place]. [address]  [place], [place] [address]  HISTORICAL INFORMATION:   Selected notes from the MEDICAL RECORD NUMBER Referred by Dr. Rosana Hoes LEE: 06.28.22 Ocular Hx- ERM OU, VMT OS, Cataracts OU. VA OD 20/20, OS 20/30    CURRENT MEDICATIONS: No current outpatient medications on file. (Ophthalmic Drugs)   No current facility-administered medications for this visit. (Ophthalmic Drugs)   Current Outpatient Medications (Other)  Medication Sig   acetaminophen (TYLENOL) 500 MG tablet Take  1,000-1,500 mg by mouth every 6 (six) hours as needed for moderate pain or headache.    amLODipine (NORVASC) 2.5 MG tablet Take 1 tablet (2.5 mg total) by mouth daily.   atorvastatin (LIPITOR) 20 MG tablet Take 1 tablet (20 mg total) by mouth daily.   calcium carbonate (OSCAL) 1500 (600 Ca) MG TABS tablet Take 600 mg by mouth 2 (two) times daily with a meal.   Cholecalciferol (VITAMIN D3 PO) Take 1 capsule by mouth daily.   EPINEPHrine 0.3 mg/0.3 mL IJ SOAJ injection Inject 0.3 mLs (0.3 mgttotal) into the muscleoonce for 1 dose.   levothyroxine (SYNTHROID) 150 MCG tablet Take 1 tablet (150 mcg total) by mouth daily.   montelukast (SINGULAIR) 10 MG tablet TAKE 1 TABLET BY MOUTH AT BEDTIME.   Multiple Vitamin (MULTIVITAMIN) tablet Take 1 tablet by mouth daily.   predniSONE (DELTASONE) 10 MG tablet Days 1-4 take 4 tablets (40 mg) daily  Days 5-8 take 3 tablets (30 mg) daily, Days 9-11 take 2 tablets (20 mg) daily, Days 12-14 take 1 tablet (10 mg) daily.   triamcinolone cream (KENALOG) 0.1 % Apply 1 application topically 2 (two) times daily.   No current facility-administered medications for this visit. (Other)      REVIEW OF SYSTEMS: ROS   Positive for: Musculoskeletal, Endocrine, Cardiovascular, Eyes Negative for: Constitutional, Gastrointestinal, Neurological, Skin, Genitourinary, HENT, Respiratory, Psychiatric, Allergic/Imm, Heme/Lymph Last edited by Jobe Marker, COT on  01/01/2021  8:45 AM.       ALLERGIES Allergies  Allergen Reactions   Sulfa Antibiotics Other (See Comments)   Bee Venom Swelling and Other (See Comments)    Red and hot to site   Fish Allergy Hives   Ivp Dye [Iodinated Diagnostic Agents] Other (See Comments)    MD advised not to use, due to fish allergies    PAST MEDICAL HISTORY Past Medical History:  Diagnosis Date   Headache    Hypertension    186/106 today    195/108 07/27/2017   Hypothyroidism    Primary osteoarthritis of knee    Right   Past  Surgical History:  Procedure Laterality Date   COLONOSCOPY N/A 01/16/2015   Procedure: COLONOSCOPY;  Surgeon: Daneil Dolin, MD;  [facility];  Service: Endoscopy;  Laterality: N/A;  8:30 AM   KNEE ARTHROPLASTY Left 2011   [place]   TOTAL KNEE ARTHROPLASTY Right 08/23/2017   Procedure: TOTAL KNEE ARTHROPLASTY;  Surgeon: Elsie Saas, MD;  Location: [facility];  Service: Orthopedics;  Laterality: Right;    FAMILY HISTORY Family History  Problem Relation Age of Onset   Hypertension Mother    Lung disease Father     SOCIAL HISTORY Social History   Tobacco Use   Smoking status: Never   Smokeless tobacco: Never  Vaping Use   Vaping Use: Never used  Substance Use Topics   Alcohol use: No   Drug use: No         OPHTHALMIC EXAM:  Base Eye Exam     Visual Acuity (Snellen - Linear)       Right Left   Dist cc 20/25 -2 20/40 -1   Dist ph cc NI NI         Tonometry (Tonopen, 10:04 AM)       Right Left   Pressure 19 18         Pupils       Dark Light Shape React APD   Right 4 3 Round Brisk None   Left 4 3 Round Brisk None         Visual Fields (Counting fingers)       Left Right    Full Full         Extraocular Movement       Right Left    Full, Ortho Full, Ortho         Neuro/Psych     Oriented x3: Yes   Mood/Affect: Normal         Dilation     Both eyes: 1.0% Mydriacyl, 2.5% Phenylephrine @ 10:04 AM           Slit Lamp and Fundus Exam     Slit Lamp Exam       Right Left   Lids/Lashes Dermatochalasis - upper lid Dermatochalasis - upper lid   Conjunctiva/Sclera White and quiet White and quiet   Cornea trace PEE, tear film debris trace PEE, tear film debris   Anterior Chamber deep, clear, narrow angles deep, clear, narrow angles   Iris Round and dilated Round and dilated   Lens 2+ Nuclear sclerosis, 2+ Cortical cataract 2+ Nuclear sclerosis, 2+ Cortical cataract   Vitreous Vitreous syneresis, Posterior vitreous  detachment Vitreous syneresis, Posterior vitreous detachment         Fundus Exam       Right Left   Disc Pink and Sharp, mild PPA/PPP Pink and Sharp   C/D Ratio 0.3  0.3   Macula Flat, Blunted foveal reflex, ERM with early striae, no heme Flat, Blunted foveal reflex, ERM with striae and retinal thickening, no heme   Vessels mild attenuation, mild tortuousity mild attenuation, mild tortuousity   Periphery Attached; no heme; no RT/RD Attached; no heme             Refraction     Wearing Rx       Sphere Cylinder Axis Add   Right +2.25 +1.00 153 +2.00   Left +1.75 +0.50 178 +2.00         Manifest Refraction       Sphere Cylinder Axis Dist VA   Right +2.25 +1.00 160 20/20-2   Left +1.75 +0.50 175 20/40-1            IMAGING AND PROCEDURES  Imaging and Procedures for 01/01/2021  OCT, Retina - OU - Both Eyes       Right Eye Quality was good. Central Foveal Thickness: 364. Progression has no prior data. Findings include no IRF, no SRF, macular pucker, epiretinal membrane, normal foveal contour.   Left Eye Quality was good. Central Foveal Thickness: 486. Progression has no prior data. Findings include abnormal foveal contour, no IRF, no SRF, epiretinal membrane, macular pucker.   Notes *Images captured and stored on drive  Diagnosis / Impression:  ERM with pucker OU (OS>OD)  Clinical management:  See below  Abbreviations: NFP - Normal foveal profile. CME - cystoid macular edema. PED - pigment epithelial detachment. IRF - intraretinal fluid. SRF - subretinal fluid. EZ - ellipsoid zone. ERM - epiretinal membrane. ORA - outer retinal atrophy. ORT - outer retinal tubulation. SRHM - subretinal hyper-reflective material. IRHM - intraretinal hyper-reflective material              ASSESSMENT/PLAN:    ICD-10-CM   1. Epiretinal membrane (ERM) of both eyes  H35.373     2. Retinal edema  H35.81 OCT, Retina - OU - Both Eyes    3. Essential hypertension  I10      4. Hypertensive retinopathy of both eyes  H35.033     5. Combined forms of age-related cataract of both eyes  H25.813       1,2. Epiretinal membrane, both eyes (OS>OD)  - pt reports history of ERM dates back to 2018-2019 -- initially noted by Dr. Radford Pax - The natural history, anatomy, potential for loss of vision, and treatment options including vitrectomy techniques and the complications of endophthalmitis, retinal detachment, vitreous hemorrhage, cataract progression and permanent vision loss discussed with the patient. - ERM w/ pucker OU (OS > OD) -- OS with loss of foveal contour and +central thickening - BCVA OD: 20/25, OS: 20/40 - OCT w/o IRF/SRF - mild blurring OU, no metamorphopsia - no indication for surgery at this time - monitor for now - f/u 3 mos -- DFE/OCT  3,4. Hypertensive retinopathy OU - discussed importance of tight BP control - monitor  5. Mixed Cataract OU - The symptoms of cataract, surgical options, and treatments and risks were discussed with patient. - discussed diagnosis and progression - discussed recommendation of having cataract surgery prior to any retina surgery, if possible - monitor  Ophthalmic Meds Ordered this visit:  No orders of the defined types were placed in this encounter.      Return in about 3 months (around 04/03/2021) for f/u ERM OU, DFE, OCT.  There are no Patient Instructions on file for this visit.   Explained the diagnoses, plan, and  follow up with the patient and they expressed understanding.  Patient expressed understanding of the importance of proper follow up care.   This document serves as a record of services personally performed by Gardiner Sleeper, MD, PhD. It was created on their behalf by Leonie Douglas, an ophthalmic technician. The creation of this record is the provider's dictation and/or activities during the visit.    Electronically signed by: Leonie Douglas COA, 01/01/21  1:02 PM  This document serves as a record  of services personally performed by Gardiner Sleeper, MD, PhD. It was created on their behalf by San Jetty. Owens Shark, OA an ophthalmic technician. The creation of this record is the provider's dictation and/or activities during the visit.    Electronically signed by: San Jetty. Owens Shark, [place] 07.06.2022 1:02 PM  Gardiner Sleeper, M.D., Ph.D. Diseases & Surgery of the Retina and [place] 01/01/2021  I have reviewed the above documentation for accuracy and completeness, and I agree with the above. Gardiner Sleeper, M.D., Ph.D. 01/01/21 1:02 PM   Abbreviations: M myopia (nearsighted); A astigmatism; H hyperopia (farsighted); P presbyopia; Mrx spectacle prescription;  CTL contact lenses; OD right eye; OS left eye; OU both eyes  XT exotropia; ET esotropia; PEK punctate epithelial keratitis; PEE punctate epithelial erosions; DES dry eye syndrome; MGD meibomian gland dysfunction; ATs artificial tears; PFAT's preservative free artificial tears; [place] nuclear sclerotic cataract; PSC posterior subcapsular cataract; ERM epi-retinal membrane; PVD posterior vitreous detachment; RD retinal detachment; DM diabetes mellitus; DR diabetic retinopathy; NPDR non-proliferative diabetic retinopathy; PDR proliferative diabetic retinopathy; CSME clinically significant macular edema; DME diabetic macular edema; dbh dot blot hemorrhages; CWS cotton wool spot; POAG primary open angle glaucoma; C/D cup-to-disc ratio; HVF humphrey visual field; GVF goldmann visual field; OCT optical coherence tomography; IOP intraocular pressure; BRVO Branch retinal vein occlusion; CRVO central retinal vein occlusion; CRAO central retinal artery occlusion; BRAO branch retinal artery occlusion; RT retinal tear; SB scleral buckle; PPV pars plana vitrectomy; VH Vitreous hemorrhage; PRP panretinal laser photocoagulation; IVK intravitreal kenalog; VMT vitreomacular traction; MH Macular hole;  NVD neovascularization of the disc; NVE  neovascularization elsewhere; AREDS age related eye disease study; ARMD age related macular degeneration; POAG primary open angle glaucoma; EBMD epithelial/anterior basement membrane dystrophy; ACIOL anterior chamber intraocular lens; IOL intraocular lens; PCIOL posterior chamber intraocular lens; Phaco/IOL phacoemulsification with intraocular lens placement; [place] photorefractive keratectomy; LASIK laser assisted in situ keratomileusis; HTN hypertension; DM diabetes mellitus; COPD chronic obstructive pulmonary disease

## 2021-01-01 ENCOUNTER — Other Ambulatory Visit: Payer: Self-pay

## 2021-01-01 ENCOUNTER — Ambulatory Visit (INDEPENDENT_AMBULATORY_CARE_PROVIDER_SITE_OTHER): Payer: Medicare Other | Admitting: Ophthalmology

## 2021-01-01 ENCOUNTER — Encounter (INDEPENDENT_AMBULATORY_CARE_PROVIDER_SITE_OTHER): Payer: Self-pay | Admitting: Ophthalmology

## 2021-01-01 DIAGNOSIS — H35373 Puckering of macula, bilateral: Secondary | ICD-10-CM | POA: Diagnosis not present

## 2021-01-01 DIAGNOSIS — I10 Essential (primary) hypertension: Secondary | ICD-10-CM

## 2021-01-01 DIAGNOSIS — H25813 Combined forms of age-related cataract, bilateral: Secondary | ICD-10-CM

## 2021-01-01 DIAGNOSIS — H3581 Retinal edema: Secondary | ICD-10-CM | POA: Diagnosis not present

## 2021-01-01 DIAGNOSIS — H35033 Hypertensive retinopathy, bilateral: Secondary | ICD-10-CM

## 2021-01-10 ENCOUNTER — Telehealth: Payer: Self-pay | Admitting: Family Medicine

## 2021-01-10 DIAGNOSIS — E039 Hypothyroidism, unspecified: Secondary | ICD-10-CM

## 2021-01-10 DIAGNOSIS — I10 Essential (primary) hypertension: Secondary | ICD-10-CM

## 2021-01-10 DIAGNOSIS — E782 Mixed hyperlipidemia: Secondary | ICD-10-CM

## 2021-01-10 NOTE — Telephone Encounter (Signed)
Placed lab orders for the patient 

## 2021-01-10 NOTE — Telephone Encounter (Signed)
Patient aware.

## 2021-01-10 NOTE — Telephone Encounter (Signed)
What labs would you liked ordered?

## 2021-01-14 ENCOUNTER — Other Ambulatory Visit: Payer: Medicare Other

## 2021-01-14 ENCOUNTER — Other Ambulatory Visit: Payer: Self-pay

## 2021-01-14 DIAGNOSIS — E782 Mixed hyperlipidemia: Secondary | ICD-10-CM | POA: Diagnosis not present

## 2021-01-14 DIAGNOSIS — E039 Hypothyroidism, unspecified: Secondary | ICD-10-CM

## 2021-01-14 DIAGNOSIS — I10 Essential (primary) hypertension: Secondary | ICD-10-CM

## 2021-01-15 LAB — LIPID PANEL
Chol/HDL Ratio: 3.8 ratio (ref 0.0–4.4)
Cholesterol, Total: 144 mg/dL (ref 100–199)
HDL: 38 mg/dL — ABNORMAL LOW (ref >39)
LDL Chol Calc (NIH): 55 mg/dL (ref 0–99)
Triglycerides: 332 mg/dL — ABNORMAL HIGH (ref 0–149)
VLDL Cholesterol Cal: 51 mg/dL — ABNORMAL HIGH (ref 5–40)

## 2021-01-15 LAB — CBC WITH DIFFERENTIAL/PLATELET
Basophils Absolute: 0.1 10*3/uL (ref 0.0–0.2)
Basos: 1 %
EOS (ABSOLUTE): 0.2 10*3/uL (ref 0.0–0.4)
Eos: 3 %
Hematocrit: 43.9 % (ref 34.0–46.6)
Hemoglobin: 14.6 g/dL (ref 11.1–15.9)
Immature Grans (Abs): 0.1 10*3/uL (ref 0.0–0.1)
Immature Granulocytes: 1 %
Lymphocytes Absolute: 2 10*3/uL (ref 0.7–3.1)
Lymphs: 29 %
MCH: 30.8 pg (ref 26.6–33.0)
MCHC: 33.3 g/dL (ref 31.5–35.7)
MCV: 93 fL (ref 79–97)
Monocytes Absolute: 0.6 10*3/uL (ref 0.1–0.9)
Monocytes: 8 %
Neutrophils Absolute: 3.9 10*3/uL (ref 1.4–7.0)
Neutrophils: 58 %
Platelets: 218 10*3/uL (ref 150–450)
RBC: 4.74 x10E6/uL (ref 3.77–5.28)
RDW: 12.8 % (ref 11.7–15.4)
WBC: 6.8 10*3/uL (ref 3.4–10.8)

## 2021-01-15 LAB — CMP14+EGFR
ALT: 36 IU/L — ABNORMAL HIGH (ref 0–32)
AST: 27 IU/L (ref 0–40)
Albumin/Globulin Ratio: 1.9 (ref 1.2–2.2)
Albumin: 4.5 g/dL (ref 3.8–4.8)
Alkaline Phosphatase: 95 IU/L (ref 44–121)
BUN/Creatinine Ratio: 11 — ABNORMAL LOW (ref 12–28)
BUN: 10 mg/dL (ref 8–27)
Bilirubin Total: 0.3 mg/dL (ref 0.0–1.2)
CO2: 25 mmol/L (ref 20–29)
Calcium: 9.3 mg/dL (ref 8.7–10.3)
Chloride: 103 mmol/L (ref 96–106)
Creatinine, Ser: 0.88 mg/dL (ref 0.57–1.00)
Globulin, Total: 2.4 g/dL (ref 1.5–4.5)
Glucose: 116 mg/dL — ABNORMAL HIGH (ref 65–99)
Potassium: 5 mmol/L (ref 3.5–5.2)
Sodium: 142 mmol/L (ref 134–144)
Total Protein: 6.9 g/dL (ref 6.0–8.5)
eGFR: 71 mL/min/{1.73_m2} (ref >59)

## 2021-01-15 LAB — TSH: TSH: 2.61 u[IU]/mL (ref 0.450–4.500)

## 2021-01-16 ENCOUNTER — Encounter: Payer: Self-pay | Admitting: Family Medicine

## 2021-01-16 ENCOUNTER — Ambulatory Visit (INDEPENDENT_AMBULATORY_CARE_PROVIDER_SITE_OTHER): Payer: Medicare Other | Admitting: Family Medicine

## 2021-01-16 ENCOUNTER — Other Ambulatory Visit: Payer: Self-pay

## 2021-01-16 VITALS — BP 164/78 | HR 83 | Ht 64.0 in | Wt 221.0 lb

## 2021-01-16 DIAGNOSIS — E782 Mixed hyperlipidemia: Secondary | ICD-10-CM

## 2021-01-16 DIAGNOSIS — I10 Essential (primary) hypertension: Secondary | ICD-10-CM

## 2021-01-16 DIAGNOSIS — E039 Hypothyroidism, unspecified: Secondary | ICD-10-CM | POA: Diagnosis not present

## 2021-01-16 MED ORDER — MONTELUKAST SODIUM 10 MG PO TABS: 10.0000 mg | ORAL_TABLET | Freq: Every day | ORAL | 3 refills | Status: DC

## 2021-01-16 NOTE — Progress Notes (Signed)
BP (!) 164/78   Pulse 83   Ht $R'5\' 4"'jg$  (1.626 m)   Wt 221 lb (100.2 kg)   SpO2 97%   BMI 37.93 kg/m    Subjective:   Patient ID: Laurie Mckenzie, female    DOB: [birth year], 70 y.o.   MRN: [id number]  HPI: Laurie Mckenzie is a 70 y.o. female presenting on 01/16/2021 for Medical Management of Chronic Issues, Hypothyroidism, and Hypertension   HPI Hypothyroidism recheck Patient is coming in for thyroid recheck today as well. They deny any issues with hair changes or heat or cold problems or diarrhea or constipation. They deny any chest pain or palpitations. They are currently on levothyroxine 150 micrograms   Hypertension Patient is currently on amlodipine, and their blood pressure today is 164/78 and 156/85, she says she checks it at home and it typically runs in the 140s over 70s. Patient denies any lightheadedness or dizziness. Patient denies headaches, blurred vision, chest pains, shortness of breath, or weakness. Denies any side effects from medication and is content with current medication.   Hyperlipidemia Patient is coming in for recheck of his hyperlipidemia. The patient is currently taking atorvastatin. They deny any issues with myalgias or history of liver damage from it. They deny any focal numbness or weakness or chest pain.   Patient does have a little bit of sinus and allergies popping up with some pressure and popping in the ears.  She has been exposed this in the morning and take an antihistamine with Singulair, recommended add Benadryl and Flonase at night and nasal saline washes  Relevant past medical, surgical, family and social history reviewed and updated as indicated. Interim medical history since our last visit reviewed. Allergies and medications reviewed and updated.  Review of Systems  Constitutional:  Negative for chills and fever.  Eyes:  Negative for visual disturbance.  Respiratory:  Negative for chest tightness and shortness of breath.   Cardiovascular:   Negative for chest pain and leg swelling.  Musculoskeletal:  Negative for back pain and gait problem.  Skin:  Negative for rash.  Neurological:  Negative for light-headedness and headaches.  Psychiatric/Behavioral:  Negative for agitation and behavioral problems.   All other systems reviewed and are negative.  Per HPI unless specifically indicated above   Allergies as of 01/16/2021       Reactions   Sulfa Antibiotics Other (See Comments)   Bee Venom Swelling, Other (See Comments)   Red and hot to site   Fish Allergy Hives   Ivp Dye [iodinated Diagnostic Agents] Other (See Comments)   MD advised not to use, due to fish allergies        Medication List        Accurate as of January 16, 2021  8:29 AM. If you have any questions, ask your nurse or doctor.          STOP taking these medications    predniSONE 10 MG tablet Commonly known as: DELTASONE Stopped by: Fransisca Kaufmann Shikira Folino, MD       TAKE these medications    acetaminophen 500 MG tablet Commonly known as: TYLENOL Take 1,000-1,500 mg by mouth every 6 (six) hours as needed for moderate pain or headache.   amLODipine 2.5 MG tablet Commonly known as: NORVASC Take 1 tablet (2.5 mg total) by mouth daily.   atorvastatin 20 MG tablet Commonly known as: LIPITOR Take 1 tablet (20 mg total) by mouth daily.   calcium carbonate 1500 (600 Ca) MG  Tabs tablet Commonly known as: OSCAL Take 600 mg by mouth 2 (two) times daily with a meal.   EPINEPHrine 0.3 mg/0.3 mL Soaj injection Commonly known as: EPI-PEN Inject 0.3 mLs (0.3 mgttotal) into the muscleoonce for 1 dose.   levothyroxine 150 MCG tablet Commonly known as: SYNTHROID Take 1 tablet (150 mcg total) by mouth daily.   montelukast 10 MG tablet Commonly known as: SINGULAIR Take 1 tablet (10 mg total) by mouth at bedtime.   multivitamin tablet Take 1 tablet by mouth daily.   triamcinolone cream 0.1 % Commonly known as: KENALOG Apply 1 application topically 2  (two) times daily.   VITAMIN D3 PO Take 1 capsule by mouth daily.         Objective:   BP (!) 164/78   Pulse 83   Ht $R'5\' 4"'Ei$  (1.626 m)   Wt 221 lb (100.2 kg)   SpO2 97%   BMI 37.93 kg/m   Wt Readings from Last 3 Encounters:  01/16/21 221 lb (100.2 kg)  12/09/20 222 lb (100.7 kg)  07/19/20 219 lb (99.3 kg)    Physical Exam Vitals and nursing note reviewed.  Constitutional:      General: She is not in acute distress.    Appearance: She is well-developed. She is not diaphoretic.  Eyes:     Conjunctiva/sclera: Conjunctivae normal.  Cardiovascular:     Rate and Rhythm: Normal rate and regular rhythm.     Heart sounds: Normal heart sounds. No murmur heard. Pulmonary:     Effort: Pulmonary effort is normal. No respiratory distress.     Breath sounds: Normal breath sounds. No wheezing.  Musculoskeletal:        General: No tenderness. Normal range of motion.  Skin:    General: Skin is warm and dry.     Findings: No rash.  Neurological:     Mental Status: She is alert and oriented to person, place, and time.     Coordination: Coordination normal.  Psychiatric:        Behavior: Behavior normal.    Results for orders placed or performed in visit on 01/14/21  TSH  Result Value Ref Range   TSH 2.610 0.450 - 4.500 uIU/mL  Lipid panel  Result Value Ref Range   Cholesterol, Total 144 100 - 199 mg/dL   Triglycerides 332 (H) 0 - 149 mg/dL   HDL 38 (L) >39 mg/dL   VLDL Cholesterol Cal 51 (H) 5 - 40 mg/dL   LDL Chol Calc (NIH) 55 0 - 99 mg/dL   Chol/HDL Ratio 3.8 0.0 - 4.4 ratio  CMP14+EGFR  Result Value Ref Range   Glucose 116 (H) 65 - 99 mg/dL   BUN 10 8 - 27 mg/dL   Creatinine, Ser 0.88 0.57 - 1.00 mg/dL   eGFR 71 >59 mL/min/1.73   BUN/Creatinine Ratio 11 (L) 12 - 28   Sodium 142 134 - 144 mmol/L   Potassium 5.0 3.5 - 5.2 mmol/L   Chloride 103 96 - 106 mmol/L   CO2 25 20 - 29 mmol/L   Calcium 9.3 8.7 - 10.3 mg/dL   Total Protein 6.9 6.0 - 8.5 g/dL   Albumin 4.5  3.8 - 4.8 g/dL   Globulin, Total 2.4 1.5 - 4.5 g/dL   Albumin/Globulin Ratio 1.9 1.2 - 2.2   Bilirubin Total 0.3 0.0 - 1.2 mg/dL   Alkaline Phosphatase 95 44 - 121 IU/L   AST 27 0 - 40 IU/L   ALT 36 (H) 0 -  32 IU/L  CBC with Differential/Platelet  Result Value Ref Range   WBC 6.8 3.4 - 10.8 x10E3/uL   RBC 4.74 3.77 - 5.28 x10E6/uL   Hemoglobin 14.6 11.1 - 15.9 g/dL   Hematocrit 43.9 34.0 - 46.6 %   MCV 93 79 - 97 fL   MCH 30.8 26.6 - 33.0 pg   MCHC 33.3 31.5 - 35.7 g/dL   RDW 12.8 11.7 - 15.4 %   Platelets 218 150 - 450 x10E3/uL   Neutrophils 58 Not Estab. %   Lymphs 29 Not Estab. %   Monocytes 8 Not Estab. %   Eos 3 Not Estab. %   Basos 1 Not Estab. %   Neutrophils Absolute 3.9 1.4 - 7.0 x10E3/uL   Lymphocytes Absolute 2.0 0.7 - 3.1 x10E3/uL   Monocytes Absolute 0.6 0.1 - 0.9 x10E3/uL   EOS (ABSOLUTE) 0.2 0.0 - 0.4 x10E3/uL   Basophils Absolute 0.1 0.0 - 0.2 x10E3/uL   Immature Granulocytes 1 Not Estab. %   Immature Grans (Abs) 0.1 0.0 - 0.1 x10E3/uL    Assessment & Plan:   Problem List Items Addressed This Visit       Cardiovascular and Mediastinum   Hypertension     Endocrine   Hypothyroidism - Primary     Other   Hyperlipidemia  Continue current medicine, will monitor blood pressure closely.  No changes for now  Follow up plan: Return in about 6 months (around 07/19/2021), or if symptoms worsen or fail to improve, for Hypothyroidism and hypertension.  Counseling provided for all of the vaccine components No orders of the defined types were placed in this encounter.   Caryl Pina, MD [facility] 01/16/2021, 8:29 AM

## 2021-02-19 DIAGNOSIS — Z1231 Encounter for screening mammogram for malignant neoplasm of breast: Secondary | ICD-10-CM | POA: Diagnosis not present

## 2021-03-07 DIAGNOSIS — R928 Other abnormal and inconclusive findings on diagnostic imaging of breast: Secondary | ICD-10-CM | POA: Diagnosis not present

## 2021-03-07 DIAGNOSIS — R921 Mammographic calcification found on diagnostic imaging of breast: Secondary | ICD-10-CM | POA: Diagnosis not present

## 2021-03-12 DIAGNOSIS — C50812 Malignant neoplasm of overlapping sites of left female breast: Secondary | ICD-10-CM | POA: Diagnosis not present

## 2021-03-12 DIAGNOSIS — C50412 Malignant neoplasm of upper-outer quadrant of left female breast: Secondary | ICD-10-CM | POA: Diagnosis not present

## 2021-03-14 ENCOUNTER — Encounter: Payer: Self-pay | Admitting: Family Medicine

## 2021-03-14 ENCOUNTER — Telehealth: Payer: Self-pay | Admitting: Oncology

## 2021-03-14 NOTE — Telephone Encounter (Signed)
Spoke to patient to confirm morning clinic appointment for 9/21, [facility] will send paperwork

## 2021-03-17 ENCOUNTER — Encounter: Payer: Self-pay | Admitting: *Deleted

## 2021-03-17 DIAGNOSIS — C50412 Malignant neoplasm of upper-outer quadrant of left female breast: Secondary | ICD-10-CM | POA: Insufficient documentation

## 2021-03-18 NOTE — Progress Notes (Signed)
Laurie Mckenzie DOB: [birth year]  MR#: [id number]  MCN#:[id number]  Patient Care Team: Dettinger, Fransisca Kaufmann, MD as PCP - General (Family Medicine) Mauro Kaufmann, RN as Oncology Nurse Navigator Rockwell Germany, RN as Oncology Nurse Navigator Erroll Luna, MD as Consulting Physician (General Surgery) Abrian Hanover, Virgie Dad, MD as Consulting Physician (Oncology) Kyung Rudd, MD as Consulting Physician (Radiation Oncology) Bernarda Caffey, MD as Consulting Physician (Ophthalmology) Chauncey Cruel, MD OTHER MD:  CHIEF COMPLAINT: Estrogen receptor positive breast cancer  CURRENT TREATMENT: Awaiting definitive surgery  HISTORY OF CURRENT ILLNESS: Laurie A Mousseau "[place]" had routine screening mammography on 02/19/2021 showing a possible abnormality in the left breast. She underwent left diagnostic mammography with tomography and left breast ultrasonography at [facility] on 03/07/2021 showing: breast density category A; 1.1 cm irregular mass in left breast at 3 o'clock; no significant abnormalities in left axilla.  Accordingly on 03/12/2021 she proceeded to biopsy of the left breast area in question. The pathology from this procedure ([id number]) showed: invasive ductal carcinoma, grade 2. Prognostic indicators significant for: estrogen receptor, 95% positive and progesterone receptor, 95% positive, both with strong staining intensity. Proliferation marker Ki67 at 30%. HER2 equivocal by immunohistochemistry (2+), but negative by fluorescent in situ hybridization with a signals ratio  P  and number per cell   P.  Cancer Staging Malignant neoplasm of upper-outer quadrant of left breast in female, estrogen receptor positive ([place]) Staging form: Breast, AJCC 8th Edition - Clinical stage from 03/19/2021: Stage IA (cT1c, cN0, cM0, G2, ER+, PR+, HER2-) - Signed by Chauncey Cruel, MD on 03/19/2021 Stage prefix: Initial  diagnosis Histologic grading system: 3 grade system Laterality: Left Staged by: Pathologist and managing physician Stage used in treatment planning: Yes National guidelines used in treatment planning: Yes Type of national guideline used in treatment planning: NCCN  The patient's subsequent history is as detailed below.   INTERVAL HISTORY: Laurie Mckenzie was evaluated in the multidisciplinary breast cancer clinic on 03/19/2021 accompanied by her close friend Laurie Mckenzie. Her Laurie Mckenzie was also presented at the multidisciplinary breast cancer conference on the same day. At that time a preliminary plan was proposed: Breast conserving surgery, Oncotype, adjuvant radiation, antiestrogens   REVIEW OF SYSTEMS: There were no specific symptoms leading to the original mammogram, which was routinely scheduled. On the provided questionnaire, Laurie Mckenzie reports loss of sleep over her diagnosis, wearing glasses, breast pain related to recent biopsy, and thyroid problem. The patient denies unusual headaches, visual changes, nausea, vomiting, stiff neck, dizziness, or gait imbalance. There has been no cough, phlegm production, or pleurisy, no chest pain or pressure, and no change in bowel or bladder habits. The patient denies fever, rash, bleeding, unexplained fatigue or unexplained weight loss. A detailed review of systems was otherwise entirely negative.   COVID 19 VACCINATION STATUS: Moderna x3   PAST MEDICAL HISTORY: Past Medical History:  Diagnosis Date   Headache    Hypertension    186/106 today    195/108 07/27/2017   Hypothyroidism    Primary osteoarthritis of knee    Right    PAST SURGICAL HISTORY: Past Surgical History:  Procedure Laterality Date   COLONOSCOPY N/A 01/16/2015   Procedure: COLONOSCOPY;  Surgeon: Daneil Dolin, MD;  [facility];  Service: Endoscopy;  Laterality: N/A;  8:30 AM   KNEE ARTHROPLASTY Left 2011   [place]   TOTAL KNEE ARTHROPLASTY Right 08/23/2017   Procedure:  TOTAL KNEE ARTHROPLASTY;  Surgeon: Elsie Saas, MD;  Location: [facility];  Service: Orthopedics;  Laterality: Right;    FAMILY HISTORY: Family History  Problem Relation Age of Onset   Hypertension Mother    Lung disease Father    Her father died at age 87 from COPD. Her mother died at age 78 from gallbladder infection. There is no family history of cancer to her knowledge.   GYNECOLOGIC HISTORY:  No LMP recorded. Patient is postmenopausal. Menarche: 70 years old [place] P 70 LMP ~70 years old Contraceptive never used HRT never used  Hysterectomy? no BSO? no   SOCIAL HISTORY: (updated 02/2021)  Laurie Mckenzie is currently retired after working for [employer] for 44 years. She is single. She lives at home by herself, with no pets. She attends [employer].    ADVANCED DIRECTIVES: in place, Laurie Mckenzie is her HCPOA   HEALTH MAINTENANCE: Social History   Tobacco Use   Smoking status: Never   Smokeless tobacco: Never  Vaping Use   Vaping Use: Never used  Substance Use Topics   Alcohol use: No   Drug use: No     Colonoscopy: 12/2014 (Dr. Gala Romney), recall 2026  PAP: "5 years ago?" (~2017)  Bone density: 03/26/2020, -1.1   Allergies  Allergen Reactions   Sulfa Antibiotics Other (See Comments)   Bee Venom Swelling and Other (See Comments)    Red and hot to site   Fish Allergy Hives   Ivp Dye [Iodinated Diagnostic Agents] Other (See Comments)    MD advised not to use, due to fish allergies    Current Outpatient Medications  Medication Sig Dispense Refill   acetaminophen (TYLENOL) 500 MG tablet Take 1,000-1,500 mg by mouth every 6 (six) hours as needed for moderate pain or headache.      amLODipine (NORVASC) 2.5 MG tablet Take 1 tablet (2.5 mg total) by mouth daily. 90 tablet 3   atorvastatin (LIPITOR) 20 MG tablet Take 1 tablet (20 mg total) by mouth daily. 90 tablet 3   calcium carbonate (OSCAL) 1500 (600 Ca) MG TABS tablet Take 600 mg by  mouth 2 (two) times daily with a meal.     Cholecalciferol (VITAMIN D3 PO) Take 1 capsule by mouth daily.     levothyroxine (SYNTHROID) 150 MCG tablet Take 1 tablet (150 mcg total) by mouth daily. 90 tablet 3   montelukast (SINGULAIR) 10 MG tablet Take 1 tablet (10 mg total) by mouth at bedtime. 90 tablet 3   Multiple Vitamin (MULTIVITAMIN) tablet Take 1 tablet by mouth daily.     triamcinolone (NASACORT) 55 MCG/ACT AERO nasal inhaler Place 2 sprays into the nose 2 (two) times daily.     EPINEPHrine 0.3 mg/0.3 mL IJ SOAJ injection Inject 0.3 mLs (0.3 mgttotal) into the muscleoonce for 1 dose. (Patient not taking: Reported on 03/19/2021) 1 each 1   triamcinolone cream (KENALOG) 0.1 % Apply 1 application topically 2 (two) times daily. 30 g 0   No current facility-administered medications for this visit.    OBJECTIVE: White woman who appears stated age  43:   03/19/21 0914  BP: (!) 174/83  Pulse: 84  Resp: 18  Temp: 97.8 F (36.6 C)  SpO2: 97%     Body mass index is 38.74 kg/m.   Wt Readings from Last 3 Encounters:  03/19/21 225 lb 11.2 oz (102.4 kg)  01/16/21 221 lb (100.2 kg)  12/09/20 222 lb (100.7 kg)      ECOG  FS:1 - Symptomatic but completely ambulatory  Ocular: Sclerae unicteric, pupils round and equal Ear-nose-throat: Wearing a mask Lymphatic: No cervical or supraclavicular adenopathy Lungs no rales or rhonchi Heart regular rate and rhythm Abd soft, obese, nontender, positive bowel sounds MSK no focal spinal tenderness, no joint edema Neuro: non-focal, well-oriented, appropriate affect Breasts: The right breast is unremarkable.  The left breast is status post recent biopsy.  There is no skin or nipple change of concern.  Both axillae are benign   LAB RESULTS:  CMP     Component Value Date/Time   NA 143 03/19/2021 0835   NA 142 01/14/2021 0841   K 3.8 03/19/2021 0835   CL 102 03/19/2021 0835   CO2 29 03/19/2021 0835   GLUCOSE 143 (H) 03/19/2021 0835   BUN  12 03/19/2021 0835   BUN 10 01/14/2021 0841   CREATININE 0.82 03/19/2021 0835   CALCIUM 10.4 (H) 03/19/2021 0835   PROT 7.8 03/19/2021 0835   PROT 6.9 01/14/2021 0841   ALBUMIN 4.2 03/19/2021 0835   ALBUMIN 4.5 01/14/2021 0841   AST 27 03/19/2021 0835   ALT 35 03/19/2021 0835   ALKPHOS 109 03/19/2021 0835   BILITOT 0.4 03/19/2021 0835   GFRNONAA >60 03/19/2021 0835   GFRAA 93 07/17/2020 0902    No results found for: TOTALPROTELP, ALBUMINELP, A1GS, A2GS, BETS, BETA2SER, GAMS, MSPIKE, SPEI  Lab Results  Component Value Date   WBC 8.0 03/19/2021   NEUTROABS 5.3 03/19/2021   HGB 15.0 03/19/2021   HCT 45.2 03/19/2021   MCV 94.0 03/19/2021   PLT 233 03/19/2021    No results found for: LABCA2  No components found for: MEQAST419  No results for input(s): INR in the last 168 hours.  No results found for: LABCA2  No results found for: QQI297  No results found for: LGX211  No results found for: HER740  No results found for: CA2729  No components found for: HGQUANT  No results found for: CEA1 / No results found for: CEA1   No results found for: AFPTUMOR  No results found for: CHROMOGRNA  No results found for: KPAFRELGTCHN, LAMBDASER, KAPLAMBRATIO (kappa/lambda light chains)  No results found for: HGBA, HGBA2QUANT, HGBFQUANT, HGBSQUAN (Hemoglobinopathy evaluation)   No results found for: LDH  No results found for: IRON, TIBC, IRONPCTSAT (Iron and TIBC)  No results found for: FERRITIN  Urinalysis    Component Value Date/Time   COLORURINE STRAW (A) 08/23/2017 0945   APPEARANCEUR Clear 01/04/2020 1159   LABSPEC 1.013 08/23/2017 0945   PHURINE 7.0 08/23/2017 0945   GLUCOSEU Negative 01/04/2020 1159   HGBUR NEGATIVE 08/23/2017 0945   BILIRUBINUR Negative 01/04/2020 1159   KETONESUR NEGATIVE 08/23/2017 0945   PROTEINUR Negative 01/04/2020 1159   PROTEINUR NEGATIVE 08/23/2017 0945   UROBILINOGEN 0.2 03/11/2007 0029   NITRITE Negative 01/04/2020 1159    NITRITE NEGATIVE 08/23/2017 0945   LEUKOCYTESUR Trace (A) 01/04/2020 1159     STUDIES: HM MAMMOGRAPHY  Result Date: 02/19/2021 Incomplete -- add imaging needed Mass and calcifications in the left breast is indeterminate. Patient has appt - orders sent   US BREAST LTD UNI LEFT INC AXILLA  Result Date: 03/07/2021 Highly suggestive of malignancy 1.1 cm x 0.5 cm x 0.5 cm irregular mass in left breast is highly suggestive of malignancy. An ultrasound guided biopsy is recommended.Laurie Mckenzie * findings were given to the patient verbally and in witting at time of examation   MM DIAG BREAST TOMO UNI LEFT  Result Date: 03/07/2021  1.2 cm irregular high density mass in the left breast is suspicious for malignancy. An ultrasound is recommended for biopsy planning purposes and to evaluate the left axilla Laurie Mckenzie * US done same day see report in chart     ELIGIBLE FOR AVAILABLE RESEARCH PROTOCOL: No  ASSESSMENT: 70 y.o. Laurie Mckenzie, [place] woman status post left breast upper outer quadrant biopsy 03/12/2021 for a clinical T1c N0, stage IA invasive ductal carcinoma, grade 2, estrogen and progesterone receptor positive, HER2 not amplified (FISH results pending) with an MIB-1 of 30%  (1) definitive surgery pending  (2) Oncotype to be obtained from the final surgical sample  (3) adjuvant radiation  (4) antiestrogens to start at the completion of local treatment  PLAN: I met today with Laurie Mckenzie to review her new diagnosis. Specifically we discussed the biology of her breast cancer, its diagnosis, staging, treatment  options and prognosis. We first reviewed the fact that cancer is not one disease but more than 100 different diseases and that it is important to keep them separate-- otherwise when friends and relatives discuss their own cancer experiences with Anjel[place] confusion can result. Similarly we explained that if breast cancer spreads to the bone or liver, the patient would not have bone cancer or liver cancer, but  breast cancer in the bone and breast cancer in the liver: one cancer in three places-- not 3 different cancers which otherwise would have to be treated in 3 different ways.  We discussed the difference between local and systemic therapy. In terms of loco-regional treatment, lumpectomy plus radiation is equivalent to mastectomy as far as survival is concerned. For this reason, and because the cosmetic results are generally superior, we recommend breast conserving surgery.   We then discussed the rationale for systemic therapy. There is some risk that this cancer may have already spread to other parts of her body. Patients frequently ask at this point about bone scans, CAT scans and PET scans to find out if they have occult breast cancer somewhere else. The problem is that in early stage disease we are much more likely to find false positives then true cancers and this would expose the patient to unnecessary procedures as well as unnecessary radiation. Scans cannot answer the question the patient really would like to know, which is whether she has microscopic disease elsewhere in her body. For those reasons we do not recommend them.  Of course we would proceed to aggressive evaluation of any symptoms that might suggest metastatic disease, but that is not the Console here.  Next we went over the options for systemic therapy which are anti-estrogens, anti-HER-2 immunotherapy, and chemotherapy. Laurie Mckenzie does not meet criteria for anti-HER-2 immunotherapy. She is a good candidate for anti-estrogens.  The question of chemotherapy is more complicated. Chemotherapy is most effective in rapidly growing, aggressive tumors. It is much less effective in low-grade, slow growing cancers. Laurie Mckenzie 's situation is intermediate. For that reason we are going to request an Oncotype from the definitive surgical sample, as suggested by NCCN guidelines. That will help [place] make a definitive decision regarding chemotherapy in this  [place].  Lemus has a good understanding of the overall plan. She agrees with it. She knows the goal of treatment in her [place] is cure. She will call with any problems that may develop before her next visit here.  Total encounter time 65 minutes.Sarajane Jews C. Meribeth Vitug, MD 03/19/2021 11:42 AM Medical Oncology and Hematology [facility] [place], [place]  [place] Tel. [phone]    Fax. [phone]   This document serves as a record of services personally performed by Lurline Del, MD. It was created on his behalf by Wilburn Mylar, a trained medical scribe. The creation of this record is based on the scribe's personal observations and the provider's statements to them.   I, Lurline Del MD, have reviewed the above documentation for accuracy and completeness, and I agree with the above.    *Total Encounter Time as defined by the Centers for Medicare and Medicaid Services includes, in addition to the face-to-face time of a patient visit (documented in the note above) non-face-to-face time: obtaining and reviewing outside history, ordering and reviewing medications, tests or procedures, care coordination (communications with other health care professionals or caregivers) and documentation in the medical record.

## 2021-03-19 ENCOUNTER — Encounter: Payer: Self-pay | Admitting: *Deleted

## 2021-03-19 ENCOUNTER — Ambulatory Visit
Admission: RE | Admit: 2021-03-19 | Discharge: 2021-03-19 | Disposition: A | Discharge status: Home / Self Care | Payer: Medicare Other | Source: Ambulatory Visit | Attending: Radiation Oncology | Admitting: Radiation Oncology

## 2021-03-19 ENCOUNTER — Ambulatory Visit: Payer: Medicare Other | Admitting: Radiation Oncology

## 2021-03-19 ENCOUNTER — Ambulatory Visit: Payer: Self-pay | Admitting: Surgery

## 2021-03-19 ENCOUNTER — Inpatient Hospital Stay: Payer: Medicare Other | Attending: Oncology

## 2021-03-19 ENCOUNTER — Encounter: Payer: Self-pay | Admitting: General Practice

## 2021-03-19 ENCOUNTER — Ambulatory Visit: Payer: Medicare Other | Attending: Surgery | Admitting: Physical Therapy

## 2021-03-19 ENCOUNTER — Inpatient Hospital Stay (HOSPITAL_BASED_OUTPATIENT_CLINIC_OR_DEPARTMENT_OTHER): Payer: Medicare Other | Admitting: Oncology

## 2021-03-19 ENCOUNTER — Other Ambulatory Visit: Payer: Self-pay

## 2021-03-19 ENCOUNTER — Encounter: Payer: Self-pay | Admitting: Physical Therapy

## 2021-03-19 VITALS — BP 174/83 | HR 84 | Temp 97.8°F | Resp 18 | Ht 64.0 in | Wt 225.7 lb

## 2021-03-19 DIAGNOSIS — Z170 Estrogen receptor positive status [ER+]: Secondary | ICD-10-CM

## 2021-03-19 DIAGNOSIS — C50412 Malignant neoplasm of upper-outer quadrant of left female breast: Secondary | ICD-10-CM

## 2021-03-19 DIAGNOSIS — R293 Abnormal posture: Secondary | ICD-10-CM | POA: Diagnosis not present

## 2021-03-19 DIAGNOSIS — Z5111 Encounter for antineoplastic chemotherapy: Secondary | ICD-10-CM | POA: Diagnosis not present

## 2021-03-19 DIAGNOSIS — Z79899 Other long term (current) drug therapy: Secondary | ICD-10-CM | POA: Insufficient documentation

## 2021-03-19 DIAGNOSIS — E039 Hypothyroidism, unspecified: Secondary | ICD-10-CM | POA: Insufficient documentation

## 2021-03-19 DIAGNOSIS — I10 Essential (primary) hypertension: Secondary | ICD-10-CM | POA: Insufficient documentation

## 2021-03-19 LAB — CMP (CANCER CENTER ONLY)
ALT: 35 U/L (ref 0–44)
AST: 27 U/L (ref 15–41)
Albumin: 4.2 g/dL (ref 3.5–5.0)
Alkaline Phosphatase: 109 U/L (ref 38–126)
Anion gap: 12 (ref 5–15)
BUN: 12 mg/dL (ref 8–23)
CO2: 29 mmol/L (ref 22–32)
Calcium: 10.4 mg/dL — ABNORMAL HIGH (ref 8.9–10.3)
Chloride: 102 mmol/L (ref 98–111)
Creatinine: 0.82 mg/dL (ref 0.44–1.00)
GFR, Estimated: >60 mL/min (ref >60)
Glucose, Bld: 143 mg/dL — ABNORMAL HIGH (ref 70–99)
Potassium: 3.8 mmol/L (ref 3.5–5.1)
Sodium: 143 mmol/L (ref 135–145)
Total Bilirubin: 0.4 mg/dL (ref 0.3–1.2)
Total Protein: 7.8 g/dL (ref 6.5–8.1)

## 2021-03-19 LAB — CBC WITH DIFFERENTIAL (CANCER CENTER ONLY)
Abs Immature Granulocytes: 0.11 10*3/uL — ABNORMAL HIGH (ref 0.00–0.07)
Basophils Absolute: 0.1 10*3/uL (ref 0.0–0.1)
Basophils Relative: 1 %
Eosinophils Absolute: 0.1 10*3/uL (ref 0.0–0.5)
Eosinophils Relative: 1 %
HCT: 45.2 % (ref 36.0–46.0)
Hemoglobin: 15 g/dL (ref 12.0–15.0)
Immature Granulocytes: 1 %
Lymphocytes Relative: 22 %
Lymphs Abs: 1.8 10*3/uL (ref 0.7–4.0)
MCH: 31.2 pg (ref 26.0–34.0)
MCHC: 33.2 g/dL (ref 30.0–36.0)
MCV: 94 fL (ref 80.0–100.0)
Monocytes Absolute: 0.7 10*3/uL (ref 0.1–1.0)
Monocytes Relative: 8 %
Neutro Abs: 5.3 10*3/uL (ref 1.7–7.7)
Neutrophils Relative %: 67 %
Platelet Count: 233 10*3/uL (ref 150–400)
RBC: 4.81 MIL/uL (ref 3.87–5.11)
RDW: 12.5 % (ref 11.5–15.5)
WBC Count: 8 10*3/uL (ref 4.0–10.5)
nRBC: 0 % (ref 0.0–0.2)

## 2021-03-19 LAB — GENETIC SCREENING ORDER

## 2021-03-19 NOTE — Progress Notes (Signed)
[place] Work  Initial Assessment  Assessment completed by [employer] Intern [name]. Notes entered by Edwyna Shell due to Intern not having full access to EPIC at this time.  Laurie Mckenzie is a 70 y.o. year old female accompanied by a friend, Laurie Mckenzie. Clinical Social Work was referred by [facility] for assessment of psychosocial needs.   SDOH (Social Determinants of Health) assessments performed: Yes SDOH Interventions    Flowsheet Row Most Recent Value  SDOH Interventions   Food Insecurity Interventions Intervention Not Indicated  Financial Strain Interventions Intervention Not Indicated  Housing Interventions Intervention Not Indicated  Transportation Interventions Intervention Not Indicated       Distress Screen completed: Yes ONCBCN DISTRESS SCREENING 03/19/2021  Screening Type Initial Screening  Distress experienced in past week (1-10) 1  Emotional problem type Nervousness/Anxiety  Spiritual/Religous concerns type Relating to God      Family/Social Information:  Housing Arrangement: patient lives alone Family members/support persons in your life? Friends/Colleagues and [employer] concerns: no  Employment: Working full time. Income source: Employment Financial concerns: No Type of concern: None Food access concerns: no Religious or spiritual practice: yes, patient regularly attends church and has a Forensic scientist. Medication Concerns: no  Services Currently in place:  N/A  Coping/ Adjustment to diagnosis: Patient understands treatment plan and what happens next? yes Concerns about diagnosis and/or treatment: I'm not especially worried about anything Patient reported stressors: Adjusting to my illness Patient enjoys sports and time with family and friends. Current coping skills/ strengths: Active sense of humor , Motivation for treatment/growth , and Supportive family/friends     SUMMARY: Current SDOH Barriers:  None identified  at this time.   Interventions: Discussed common feeling and emotions when being diagnosed with cancer, and the importance of support during treatment Informed patient of the support team roles and support services at [facility] Provided CSW contact information and encouraged patient to call with any questions or concerns   Follow Up Plan:  BSW Intern will call patient in a few weeks to check in. Patient verbalizes understanding of plan: Yes   [name], [place] , LCSW

## 2021-03-19 NOTE — Progress Notes (Signed)
Radiation Oncology         [phone] ________________________________  Name: Laurie Mckenzie        MRN: [id number]  Date of Service: 03/19/2021 DOB: [birth year]  OR:[id number], Fransisca Kaufmann, MD  Erroll Luna, MD     REFERRING PHYSICIAN: Erroll Luna, MD   DIAGNOSIS: The encounter diagnosis was Malignant neoplasm of upper-outer quadrant of left breast in female, estrogen receptor positive ([place]).   HISTORY OF PRESENT ILLNESS: Laurie Mckenzie is a 70 y.o. female seen in the multidisciplinary breast clinic for a new diagnosis of left breast cancer. The patient was noted to have a screening detected group of calcifications and mass in the left breast. Diagnostic ultrasound on 03/07/21 measured a mass at 3:00 up to 1.1 cm. 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 She's seen today to discuss treatment of her cancer.   PREVIOUS RADIATION THERAPY: No   PAST MEDICAL HISTORY:  Past Medical History:  Diagnosis Date   Headache    Hypertension    186/106 today    195/108 07/27/2017   Hypothyroidism    Primary osteoarthritis of knee    Right       PAST SURGICAL HISTORY: Past Surgical History:  Procedure Laterality Date   COLONOSCOPY N/A 01/16/2015   Procedure: COLONOSCOPY;  Surgeon: Daneil Dolin, MD;  [facility];  Service: Endoscopy;  Laterality: N/A;  8:30 AM   KNEE ARTHROPLASTY Left 2011   [place]   TOTAL KNEE ARTHROPLASTY Right 08/23/2017   Procedure: TOTAL KNEE ARTHROPLASTY;  Surgeon: Elsie Saas, MD;  Location: [place];  Service: Orthopedics;  Laterality: Right;     FAMILY HISTORY:  Family History  Problem Relation Age of Onset   Hypertension Mother    Lung disease Father      SOCIAL HISTORY:  reports that she has never smoked. She has never used smokeless tobacco. She reports that she does not drink alcohol and does not use  drugs. The patient is single and lives in [place]. She is accompanied by her friend Arville Go and they describe their relationship like chosen sisters. She is active in church and retired from a banking role, and enjoys mowing her property and [place].   ALLERGIES: Sulfa antibiotics, Bee venom, Fish allergy, and Ivp dye [iodinated diagnostic agents]   MEDICATIONS:  Current Outpatient Medications  Medication Sig Dispense Refill   acetaminophen (TYLENOL) 500 MG tablet Take 1,000-1,500 mg by mouth every 6 (six) hours as needed for moderate pain or headache.      amLODipine (NORVASC) 2.5 MG tablet Take 1 tablet (2.5 mg total) by mouth daily. 90 tablet 3   atorvastatin (LIPITOR) 20 MG tablet Take 1 tablet (20 mg total) by mouth daily. 90 tablet 3   calcium carbonate (OSCAL) 1500 (600 Ca) MG TABS tablet Take 600 mg by mouth 2 (two) times daily with a meal.     Cholecalciferol (VITAMIN D3 PO) Take 1 capsule by mouth daily.     EPINEPHrine 0.3 mg/0.3 mL IJ SOAJ injection Inject 0.3 mLs (0.3 mgttotal) into the muscleoonce for 1 dose. 1 each 1   levothyroxine (SYNTHROID) 150 MCG tablet Take 1 tablet (150 mcg total) by mouth daily. 90 tablet 3   montelukast (SINGULAIR) 10 MG tablet Take 1 tablet (10 mg total) by mouth at bedtime. 90 tablet 3   Multiple  Vitamin (MULTIVITAMIN) tablet Take 1 tablet by mouth daily.     triamcinolone cream (KENALOG) 0.1 % Apply 1 application topically 2 (two) times daily. 30 g 0   No current facility-administered medications for this encounter.     REVIEW OF SYSTEMS: On review of systems, the patient reports that she is doing very well and reports she is doing well but a bit sore from her biopsy. No other complaints are verbalized.      PHYSICAL EXAM:  Wt Readings from Last 3 Encounters:  01/16/21 221 lb (100.2 kg)  12/09/20 222 lb (100.7 kg)  07/19/20 219 lb (99.3 kg)   Temp Readings from Last 3 Encounters:  12/09/20 98.7 F (37.1 C) (Temporal)   07/19/20 97.7 F (36.5 C)  01/17/20 98.1 F (36.7 C)   BP Readings from Last 3 Encounters:  01/16/21 (!) 164/78  12/09/20 (!) 159/82  07/19/20 (!) 163/87   Pulse Readings from Last 3 Encounters:  01/16/21 83  12/09/20 93  07/19/20 92    In general this is a well appearing caucasian female in no acute distress. She's alert and oriented x4 and appropriate throughout the examination. Cardiopulmonary assessment is negative for acute distress and she exhibits normal effort. Bilateral breast exam is deferred.    ECOG = 1  0 - Asymptomatic (Fully active, able to carry on all predisease activities without restriction)  1 - Symptomatic but completely ambulatory (Restricted in physically strenuous activity but ambulatory and able to carry out work of a light or sedentary nature. For example, light housework, office work)  2 - Symptomatic, <50% in bed during the day (Ambulatory and capable of all self care but unable to carry out any work activities. Up and about more than 50% of waking hours)  3 - Symptomatic, >50% in bed, but not bedbound (Capable of only limited self-care, confined to bed or chair 50% or more of waking hours)  4 - Bedbound (Completely disabled. Cannot carry on any self-care. Totally confined to bed or chair)  5 - Death   Eustace Pen MM, Creech RH, Tormey DC, et al. [phone]). "Toxicity and response criteria of the [facility]". [place] Oncol. 5 (6): 649-55    LABORATORY DATA:  Lab Results  Component Value Date   WBC 6.8 01/14/2021   HGB 14.6 01/14/2021   HCT 43.9 01/14/2021   MCV 93 01/14/2021   PLT 218 01/14/2021   Lab Results  Component Value Date   NA 142 01/14/2021   K 5.0 01/14/2021   CL 103 01/14/2021   CO2 25 01/14/2021   Lab Results  Component Value Date   ALT 36 (H) 01/14/2021   AST 27 01/14/2021   ALKPHOS 95 01/14/2021   BILITOT 0.3 01/14/2021      RADIOGRAPHY: HM MAMMOGRAPHY  Result Date: 02/19/2021 Incomplete --  add imaging needed Mass and calcifications in the left breast is indeterminate. Patient has appt - orders sent   US BREAST LTD UNI LEFT INC AXILLA  Result Date: 03/07/2021 Highly suggestive of malignancy 1.1 cm x 0.5 cm x 0.5 cm irregular mass in left breast is highly suggestive of malignancy. An ultrasound guided biopsy is recommended.Teola Bradley * findings were given to the patient verbally and in witting at time of examation   MM DIAG BREAST TOMO UNI LEFT  Result Date: 03/07/2021 1.2 cm irregular high density mass in the left breast is suspicious for malignancy. An ultrasound is recommended for biopsy planning purposes and to evaluate the  left axilla [place] * US done same day see report in chart       IMPRESSION/PLAN: 1. 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 Dr. Lisbeth Renshaw discusses the pathology findings and reviews the nature of early stage breast disease. The consensus from the breast conference includes breast conservation with lumpectomy with sentinel node biopsy. Dr. Jana Hakim anticipates Oncotype Dx score to determine a role for systemic therapy. Provided that chemotherapy is not indicated, the patient's course would then be followed by external radiotherapy to the breast  to reduce risks of local recurrence followed by antiestrogen therapy. We discussed the risks, benefits, short, and long term effects of radiotherapy, as well as the curative intent, and the patient is interested in proceeding. Dr. Lisbeth Renshaw discusses the delivery and logistics of radiotherapy and anticipates a course of 4 or up to 6 1/2 weeks of radiotherapy to the left breast with deep inspiration breath hold technique. We will see her back a few weeks after surgery to discuss the simulation process and anticipate we starting radiotherapy about 4-6 weeks after surgery.     In a visit lasting 60 minutes, greater than 50% of the time was spent face to face reviewing her [place], as well as in  preparation of, discussing, and coordinating the patient's care.  The above documentation reflects my direct findings during this shared patient visit. Please see the separate note by Dr. Lisbeth Renshaw on this date for the remainder of the patient's plan of care.    Carola Rhine, [facility]    **Disclaimer: This note was dictated with voice recognition software. Similar sounding words can inadvertently be transcribed and this note may contain transcription errors which may not have been corrected upon publication of note.**

## 2021-03-19 NOTE — Patient Instructions (Signed)

## 2021-03-19 NOTE — Therapy (Signed)
[place], [place], [address] Phone: [phone]   Fax:  [phone]  Physical Therapy Evaluation  Patient Details  Name: Laurie Mckenzie MRN: [id number] Date of Birth: 1950/07/14 Referring Provider (PT): Dr. Erroll Luna   Encounter Date: 03/19/2021   PT End of Session - 03/19/21 1152     Visit Number 1    Number of Visits 2    Date for PT Re-Evaluation 05/14/21    PT Start Time 0954    PT Stop Time 1012   Also saw pt from 1050-1100 for a total of 28 min   PT Time Calculation (min) 18 min    Activity Tolerance Patient tolerated treatment well    Behavior During Therapy [facility] for tasks assessed/performed             Past Medical History:  Diagnosis Date   Headache    Hypertension    186/106 today    195/108 07/27/2017   Hypothyroidism    Primary osteoarthritis of knee    Right    Past Surgical History:  Procedure Laterality Date   COLONOSCOPY N/A 01/16/2015   Procedure: COLONOSCOPY;  Surgeon: Daneil Dolin, MD;  [facility];  Service: Endoscopy;  Laterality: N/A;  8:30 AM   KNEE ARTHROPLASTY Left 2011   [place]   TOTAL KNEE ARTHROPLASTY Right 08/23/2017   Procedure: TOTAL KNEE ARTHROPLASTY;  Surgeon: Elsie Saas, MD;  Location: [place];  Service: Orthopedics;  Laterality: Right;    There were no vitals filed for this visit.    Subjective Assessment - 03/19/21 1140     Subjective Patient reports she is here today to be seen by her medical team for her newly diagnosed left breast cancer.    Patient is accompained by: Family member    Pertinent History Patient was diagnosed on 02/13/2021 with 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 It measures 9 mm and is located in the upper outer quadrant. It is ER/PR positive and HER2 negative with a Ki67 of 15%.    Patient Stated Goals Reduce lymphedema risk and learn post op shoulder HEP    Currently in Pain? No/denies                 [facility] PT Assessment - 03/19/21 0001       Assessment   Medical Diagnosis Left breast cancer    Referring Provider (PT) Dr. Marcello Moores [place]    Onset Date/Surgical Date 02/13/21    Hand Dominance Right    Prior Therapy none      Precautions   Precautions Other (comment)    Precaution Comments active cancer      Restrictions   Weight Bearing Restrictions No      Balance Screen   Has the patient fallen in the past 6 months No    Has the patient had a decrease in activity level because of a fear of falling?  No    Is the patient reluctant to leave their home because of a fear of falling?  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 [place] She does yoga once a week and  does 1 hour of cardio daily (HIIT, shred, [place])      Cognition   Overall Cognitive Status Within Functional Limits for tasks assessed      Posture/Postural Control   Posture/Postural Control Postural limitations    Postural Limitations Rounded Shoulders;Forward head      ROM / Strength   AROM / PROM / Strength AROM;Strength      AROM   Overall AROM Comments Cervical AROM is WNL    AROM Assessment Site Shoulder    Right/Left Shoulder Right;Left    Right Shoulder Extension 52 Degrees    Right Shoulder Flexion 154 Degrees    Right Shoulder ABduction 160 Degrees    Right Shoulder Internal Rotation 56 Degrees    Right Shoulder External Rotation 77 Degrees    Left Shoulder Extension 55 Degrees    Left Shoulder Flexion 150 Degrees    Left Shoulder ABduction 157 Degrees    Left Shoulder Internal Rotation 60 Degrees    Left Shoulder External Rotation 89 Degrees               LYMPHEDEMA/ONCOLOGY QUESTIONNAIRE -  03/19/21 0001       Type   Cancer Type Left breast cancer      Lymphedema Assessments   Lymphedema Assessments Upper extremities      Right Upper Extremity Lymphedema   10 cm Proximal to Olecranon Process 29.7 cm    Olecranon Process 25.6 cm    10 cm Proximal to Ulnar Styloid Process 20.6 cm    Just Proximal to Ulnar Styloid Process 16.2 cm    Across Hand at [employer] 18.1 cm    At [place] of 2nd Digit 5.9 cm      Left Upper Extremity Lymphedema   10 cm Proximal to Olecranon Process 30.4 cm    Olecranon Process 26.5 cm    10 cm Proximal to Ulnar Styloid Process 21.3 cm    Just Proximal to Ulnar Styloid Process 16.5 cm    Across Hand at [employer] 18.5 cm    At [place] of 2nd Digit 5.8 cm             L-DEX FLOWSHEETS - 03/19/21 1100       L-DEX LYMPHEDEMA SCREENING   Measurement Type Unilateral    L-DEX MEASUREMENT EXTREMITY Upper Extremity    POSITION  Standing    DOMINANT SIDE Right    At Risk Side Left    BASELINE SCORE (UNILATERAL) 5.8             The patient was assessed using the L-Dex machine today to produce a lymphedema index baseline score. The patient will be reassessed on a regular basis (typically every 3 months) to obtain new L-Dex scores. If the score is > 6.5 points away from his/her baseline score indicating onset of subclinical lymphedema, it will be recommended to wear a compression garment for 4 weeks, 12 hours per day and then be reassessed. If the score continues to be > 6.5 points from baseline at reassessment, we will initiate lymphedema treatment. Assessing in this manner has a 95% rate of preventing clinically significant lymphedema.      Katina Dung - 03/19/21 0001     Open a tight or new jar No difficulty    Do heavy household chores (wash walls, wash floors) No difficulty    Carry a shopping bag or briefcase No difficulty    Wash your back No difficulty    Use a  knife to cut food No difficulty    Recreational activities in  which you take some force or impact through your arm, shoulder, or hand (golf, hammering, tennis) No difficulty    During the past week, to what extent has your arm, shoulder or hand problem interfered with your normal social activities with family, friends, neighbors, or groups? Not at all    During the past week, to what extent has your arm, shoulder or hand problem limited your work or other regular daily activities Not at all    Arm, shoulder, or hand pain. None    Tingling (pins and needles) in your arm, shoulder, or hand None    Difficulty Sleeping No difficulty    DASH Score 0 %              Objective measurements completed on examination: See above findings.        Patient was instructed today in a home exercise program today for post op shoulder range of motion. These included active assist shoulder flexion in sitting, scapular retraction, wall walking with shoulder abduction, and hands behind head external rotation.  She was encouraged to do these twice a day, holding 3 seconds and repeating 5 times when permitted by her physician.          PT Education - 03/19/21 1150     Education Details Lymphedema education and post op shoulder HEP    Person(s) Educated Patient    Methods Explanation;Demonstration;Handout    Comprehension Returned demonstration;Verbalized understanding                 PT Long Term Goals - 03/19/21 1156       PT LONG TERM GOAL #1   Title Patient will demonstrate she has regained full shoulder ROM and function post operatively compared to baselines.    Time 8    Period Weeks    Status New    Target Date 05/14/21             Breast Clinic Goals - 03/19/21 1156       Patient will be able to verbalize understanding of pertinent lymphedema risk reduction practices relevant to her diagnosis specifically related to skin care.   Time 1    Period Days    Status Achieved      Patient will be able to return demonstrate and/or  verbalize understanding of the post-op home exercise program related to regaining shoulder range of motion.   Time 1    Period Days    Status Achieved      Patient will be able to verbalize understanding of the importance of attending the postoperative After Breast Cancer Class for further lymphedema risk reduction education and therapeutic exercise.   Time 1    Period Days    Status Achieved                   Plan - 03/19/21 1152     Clinical Impression Statement Patient was diagnosed on 02/13/2021 with 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 It measures 9 mm and is located in the upper outer quadrant. It is ER/PR positive and HER2 negative with a Ki67 of 15%. Her multidisciplinary medical team met prior to her assessments to determine a recommended treatment plan She is planning to have a left lumpectomy and sentinel node biopsy followed by Oncotype testing, radiation, and anti-estrogen therapy. She will benefit from a post op PT reassessment to determine needs and from  L-Dex screens every 3 months for 2 yeras to detect subclinical lymphedema.    Stability/Clinical Decision Making Stable/Uncomplicated    Clinical Decision Making Low    Rehab Potential Excellent    PT Frequency --   Eval and 1 f/u visit   PT Treatment/Interventions ADLs/Self Care Home Management;Patient/family education;Therapeutic exercise    PT Next Visit Plan Will reassess 3-4 weeks post op    PT Home Exercise Plan Post op HEP    Consulted and Agree with Plan of Care Patient;Family member/caregiver    Family Member Consulted friend             Patient will benefit from skilled therapeutic intervention in order to improve the following deficits and impairments:  Postural dysfunction, Decreased range of motion, Decreased knowledge of precautions, Impaired UE functional use, Pain  Visit Diagnosis: Malignant neoplasm of upper-outer quadrant of left breast in female, estrogen receptor positive  ([place]) - Plan: PT plan of care cert/re-cert  Abnormal posture - Plan: PT plan of care cert/re-cert  Patient will follow up at outpatient cancer rehab 3-4 weeks following surgery.  If the patient requires physical therapy at that time, a specific plan will be dictated and sent to the referring physician for approval. The patient was educated today on appropriate basic range of motion exercises to begin post operatively and the importance of attending the After Breast Cancer class following surgery.  Patient was educated today on lymphedema risk reduction practices as it pertains to recommendations that will benefit the patient immediately following surgery.  She verbalized good understanding.      Problem List Patient Active Problem List   Diagnosis Date Noted   Malignant neoplasm of upper-outer quadrant of left breast in female, estrogen receptor positive ([place]) 03/17/2021   Dysuria 01/04/2020   Primary localized osteoarthritis of right knee 08/23/2017   Hypothyroidism    Hypertension    Acute maxillary sinusitis 07/09/2015   Chronic allergic rhinitis 03/18/2015   Special screening for malignant neoplasms, colon    Obesity (BMI 30-39.9) 02/20/2014   Hyperlipidemia 12/25/2013   Annia Friendly, PT 03/19/21 12:00 PM   [place] [address] [place], [place], [address] Phone: [phone]   Fax:  [phone]  Name: Laurie Mckenzie MRN: [id number] Date of Birth: 04/11/1951

## 2021-03-20 ENCOUNTER — Encounter (HOSPITAL_BASED_OUTPATIENT_CLINIC_OR_DEPARTMENT_OTHER): Payer: Self-pay | Admitting: Surgery

## 2021-03-20 ENCOUNTER — Other Ambulatory Visit: Payer: Self-pay

## 2021-03-21 ENCOUNTER — Encounter (HOSPITAL_BASED_OUTPATIENT_CLINIC_OR_DEPARTMENT_OTHER)
Admission: RE | Admit: 2021-03-21 | Discharge: 2021-03-21 | Disposition: A | Discharge status: Home / Self Care | Payer: Medicare Other | Source: Ambulatory Visit | Attending: Surgery | Admitting: Surgery

## 2021-03-21 ENCOUNTER — Other Ambulatory Visit: Payer: Self-pay

## 2021-03-21 DIAGNOSIS — Z01812 Encounter for preprocedural laboratory examination: Secondary | ICD-10-CM | POA: Insufficient documentation

## 2021-03-21 DIAGNOSIS — C50412 Malignant neoplasm of upper-outer quadrant of left female breast: Secondary | ICD-10-CM | POA: Diagnosis not present

## 2021-03-21 DIAGNOSIS — Z170 Estrogen receptor positive status [ER+]: Secondary | ICD-10-CM | POA: Diagnosis not present

## 2021-03-21 LAB — COMPREHENSIVE METABOLIC PANEL
ALT: 34 U/L (ref 0–44)
AST: 32 U/L (ref 15–41)
Albumin: 3.9 g/dL (ref 3.5–5.0)
Alkaline Phosphatase: 95 U/L (ref 38–126)
Anion gap: 10 (ref 5–15)
BUN: 11 mg/dL (ref 8–23)
CO2: 26 mmol/L (ref 22–32)
Calcium: 9.4 mg/dL (ref 8.9–10.3)
Chloride: 101 mmol/L (ref 98–111)
Creatinine, Ser: 0.71 mg/dL (ref 0.44–1.00)
GFR, Estimated: >60 mL/min (ref >60)
Glucose, Bld: 125 mg/dL — ABNORMAL HIGH (ref 70–99)
Potassium: 4.5 mmol/L (ref 3.5–5.1)
Sodium: 137 mmol/L (ref 135–145)
Total Bilirubin: 0.5 mg/dL (ref 0.3–1.2)
Total Protein: 7.1 g/dL (ref 6.5–8.1)

## 2021-03-21 LAB — CBC WITH DIFFERENTIAL/PLATELET
Abs Immature Granulocytes: 0.12 10*3/uL — ABNORMAL HIGH (ref 0.00–0.07)
Basophils Absolute: 0.1 10*3/uL (ref 0.0–0.1)
Basophils Relative: 1 %
Eosinophils Absolute: 0.2 10*3/uL (ref 0.0–0.5)
Eosinophils Relative: 3 %
HCT: 44.6 % (ref 36.0–46.0)
Hemoglobin: 14.4 g/dL (ref 12.0–15.0)
Immature Granulocytes: 2 %
Lymphocytes Relative: 26 %
Lymphs Abs: 1.9 10*3/uL (ref 0.7–4.0)
MCH: 30.8 pg (ref 26.0–34.0)
MCHC: 32.3 g/dL (ref 30.0–36.0)
MCV: 95.5 fL (ref 80.0–100.0)
Monocytes Absolute: 0.6 10*3/uL (ref 0.1–1.0)
Monocytes Relative: 9 %
Neutro Abs: 4.4 10*3/uL (ref 1.7–7.7)
Neutrophils Relative %: 59 %
Platelets: 233 10*3/uL (ref 150–400)
RBC: 4.67 MIL/uL (ref 3.87–5.11)
RDW: 12.5 % (ref 11.5–15.5)
WBC: 7.3 10*3/uL (ref 4.0–10.5)
nRBC: 0 % (ref 0.0–0.2)

## 2021-03-21 NOTE — Progress Notes (Signed)

## 2021-03-25 ENCOUNTER — Encounter: Payer: Self-pay | Admitting: *Deleted

## 2021-03-25 NOTE — Progress Notes (Signed)
[name] CSW Progress Notes  SW Intern called pt to follow up from initial assessment at [facility] on 9/21, as agreed. Intern left pt a message with number to call with any questions or concerns.  [name], [place] Intern

## 2021-03-26 DIAGNOSIS — C50812 Malignant neoplasm of overlapping sites of left female breast: Secondary | ICD-10-CM | POA: Diagnosis not present

## 2021-03-27 ENCOUNTER — Ambulatory Visit (HOSPITAL_BASED_OUTPATIENT_CLINIC_OR_DEPARTMENT_OTHER): Payer: Medicare Other | Admitting: Anesthesiology

## 2021-03-27 ENCOUNTER — Other Ambulatory Visit: Payer: Self-pay

## 2021-03-27 ENCOUNTER — Ambulatory Visit (HOSPITAL_BASED_OUTPATIENT_CLINIC_OR_DEPARTMENT_OTHER)
Admission: RE | Admit: 2021-03-27 | Discharge: 2021-03-27 | Disposition: A | Discharge status: Home / Self Care | Payer: Medicare Other | Attending: Surgery | Admitting: Surgery

## 2021-03-27 ENCOUNTER — Encounter (HOSPITAL_BASED_OUTPATIENT_CLINIC_OR_DEPARTMENT_OTHER)
Admission: RE | Disposition: A | Discharge status: Home / Self Care | Payer: Self-pay | Source: Home / Self Care | Attending: Surgery

## 2021-03-27 ENCOUNTER — Encounter (HOSPITAL_BASED_OUTPATIENT_CLINIC_OR_DEPARTMENT_OTHER): Payer: Self-pay | Admitting: Surgery

## 2021-03-27 DIAGNOSIS — Z79899 Other long term (current) drug therapy: Secondary | ICD-10-CM | POA: Diagnosis not present

## 2021-03-27 DIAGNOSIS — Z882 Allergy status to sulfonamides status: Secondary | ICD-10-CM | POA: Diagnosis not present

## 2021-03-27 DIAGNOSIS — E039 Hypothyroidism, unspecified: Secondary | ICD-10-CM | POA: Diagnosis not present

## 2021-03-27 DIAGNOSIS — Z170 Estrogen receptor positive status [ER+]: Secondary | ICD-10-CM

## 2021-03-27 DIAGNOSIS — D0512 Intraductal carcinoma in situ of left breast: Secondary | ICD-10-CM | POA: Diagnosis not present

## 2021-03-27 DIAGNOSIS — Z79890 Hormone replacement therapy (postmenopausal): Secondary | ICD-10-CM | POA: Diagnosis not present

## 2021-03-27 DIAGNOSIS — C50812 Malignant neoplasm of overlapping sites of left female breast: Secondary | ICD-10-CM | POA: Diagnosis not present

## 2021-03-27 DIAGNOSIS — E785 Hyperlipidemia, unspecified: Secondary | ICD-10-CM | POA: Insufficient documentation

## 2021-03-27 DIAGNOSIS — D360 Benign neoplasm of lymph nodes: Secondary | ICD-10-CM | POA: Insufficient documentation

## 2021-03-27 DIAGNOSIS — Z96653 Presence of artificial knee joint, bilateral: Secondary | ICD-10-CM | POA: Diagnosis not present

## 2021-03-27 DIAGNOSIS — Z8249 Family history of ischemic heart disease and other diseases of the circulatory system: Secondary | ICD-10-CM | POA: Insufficient documentation

## 2021-03-27 DIAGNOSIS — C50412 Malignant neoplasm of upper-outer quadrant of left female breast: Secondary | ICD-10-CM | POA: Diagnosis not present

## 2021-03-27 DIAGNOSIS — I10 Essential (primary) hypertension: Secondary | ICD-10-CM | POA: Insufficient documentation

## 2021-03-27 DIAGNOSIS — G8918 Other acute postprocedural pain: Secondary | ICD-10-CM | POA: Diagnosis not present

## 2021-03-27 DIAGNOSIS — Z91041 Radiographic dye allergy status: Secondary | ICD-10-CM | POA: Diagnosis not present

## 2021-03-27 DIAGNOSIS — C50912 Malignant neoplasm of unspecified site of left female breast: Secondary | ICD-10-CM | POA: Diagnosis not present

## 2021-03-27 HISTORY — PX: BREAST LUMPECTOMY WITH RADIOACTIVE SEED AND SENTINEL LYMPH NODE BIOPSY: SHX6550

## 2021-03-27 SURGERY — BREAST LUMPECTOMY WITH RADIOACTIVE SEED AND SENTINEL LYMPH NODE BIOPSY
Anesthesia: General | Site: Breast | Laterality: Left

## 2021-03-27 MED ORDER — MIDAZOLAM HCL 2 MG/2ML IJ SOLN
INTRAMUSCULAR | Status: AC
  Filled 2021-03-27: qty 2

## 2021-03-27 MED ORDER — CEFAZOLIN IN SODIUM CHLORIDE 3-0.9 GM/100ML-% IV SOLN
INTRAVENOUS | Status: AC
  Filled 2021-03-27: qty 100

## 2021-03-27 MED ORDER — EPHEDRINE SULFATE 50 MG/ML IJ SOLN
INTRAMUSCULAR | Status: DC | PRN
  Administered 2021-03-27: 10 mg via INTRAVENOUS

## 2021-03-27 MED ORDER — LIDOCAINE 2% (20 MG/ML) 5 ML SYRINGE
INTRAMUSCULAR | Status: DC | PRN
  Administered 2021-03-27: 40 mg via INTRAVENOUS

## 2021-03-27 MED ORDER — CEFAZOLIN SODIUM-DEXTROSE 2-3 GM-%(50ML) IV SOLR
INTRAVENOUS | Status: DC | PRN
  Administered 2021-03-27: 2 g via INTRAVENOUS

## 2021-03-27 MED ORDER — PROPOFOL 10 MG/ML IV BOLUS
INTRAVENOUS | Status: DC | PRN
  Administered 2021-03-27: 200 mg via INTRAVENOUS

## 2021-03-27 MED ORDER — DEXAMETHASONE SODIUM PHOSPHATE 10 MG/ML IJ SOLN
INTRAMUSCULAR | Status: AC
  Filled 2021-03-27: qty 1

## 2021-03-27 MED ORDER — DEXAMETHASONE SODIUM PHOSPHATE 4 MG/ML IJ SOLN
INTRAMUSCULAR | Status: DC | PRN
  Administered 2021-03-27: 10 mg via INTRAVENOUS

## 2021-03-27 MED ORDER — IBUPROFEN 800 MG PO TABS: 800.0000 mg | ORAL_TABLET | Freq: Three times a day (TID) | ORAL | 0 refills | Status: DC | PRN

## 2021-03-27 MED ORDER — FENTANYL CITRATE (PF) 100 MCG/2ML IJ SOLN
50.0000 ug | Freq: Once | INTRAMUSCULAR | Status: AC
  Administered 2021-03-27: 50 ug via INTRAVENOUS

## 2021-03-27 MED ORDER — BUPIVACAINE-EPINEPHRINE (PF) 0.25% -1:200000 IJ SOLN
INTRAMUSCULAR | Status: DC | PRN
  Administered 2021-03-27: 18 mL

## 2021-03-27 MED ORDER — ONDANSETRON HCL 4 MG/2ML IJ SOLN
4.0000 mg | Freq: Four times a day (QID) | INTRAMUSCULAR | Status: AC | PRN
  Administered 2021-03-27: 4 mg via INTRAVENOUS

## 2021-03-27 MED ORDER — MIDAZOLAM HCL 2 MG/2ML IJ SOLN
1.0000 mg | Freq: Once | INTRAMUSCULAR | Status: AC
  Administered 2021-03-27: 1 mg via INTRAVENOUS

## 2021-03-27 MED ORDER — ONDANSETRON HCL 4 MG/2ML IJ SOLN
INTRAMUSCULAR | Status: DC | PRN
  Administered 2021-03-27: 4 mg via INTRAVENOUS

## 2021-03-27 MED ORDER — SODIUM CHLORIDE 0.9 % IV SOLN
INTRAVENOUS | Status: DC | PRN
  Administered 2021-03-27: 500 mL

## 2021-03-27 MED ORDER — FENTANYL CITRATE (PF) 100 MCG/2ML IJ SOLN: 25.0000 ug | INTRAMUSCULAR | Status: DC | PRN

## 2021-03-27 MED ORDER — ONDANSETRON HCL 4 MG/2ML IJ SOLN
INTRAMUSCULAR | Status: AC
  Filled 2021-03-27: qty 2

## 2021-03-27 MED ORDER — CHLORHEXIDINE GLUCONATE CLOTH 2 % EX PADS: 6.0000 | MEDICATED_PAD | Freq: Once | CUTANEOUS | Status: DC

## 2021-03-27 MED ORDER — OXYCODONE HCL 5 MG/5ML PO SOLN: 5.0000 mg | Freq: Once | ORAL | Status: DC | PRN

## 2021-03-27 MED ORDER — BUPIVACAINE-EPINEPHRINE (PF) 0.5% -1:200000 IJ SOLN
INTRAMUSCULAR | Status: DC | PRN
  Administered 2021-03-27: 25 mL via PERINEURAL

## 2021-03-27 MED ORDER — CEFAZOLIN IN SODIUM CHLORIDE 3-0.9 GM/100ML-% IV SOLN: 3.0000 g | INTRAVENOUS | Status: DC

## 2021-03-27 MED ORDER — HYDROCODONE-ACETAMINOPHEN 5-325 MG PO TABS: 1.0000 | ORAL_TABLET | Freq: Four times a day (QID) | ORAL | 0 refills | Status: DC | PRN

## 2021-03-27 MED ORDER — PROPOFOL 10 MG/ML IV BOLUS
INTRAVENOUS | Status: AC
  Filled 2021-03-27: qty 20

## 2021-03-27 MED ORDER — VANCOMYCIN HCL 500 MG IV SOLR
INTRAVENOUS | Status: AC
  Filled 2021-03-27: qty 10

## 2021-03-27 MED ORDER — FENTANYL CITRATE (PF) 100 MCG/2ML IJ SOLN
INTRAMUSCULAR | Status: AC
  Filled 2021-03-27: qty 2

## 2021-03-27 MED ORDER — CEFAZOLIN SODIUM-DEXTROSE 2-4 GM/100ML-% IV SOLN
INTRAVENOUS | Status: AC
  Filled 2021-03-27: qty 100

## 2021-03-27 MED ORDER — VANCOMYCIN HCL 500 MG IV SOLR
INTRAVENOUS | Status: DC | PRN
  Administered 2021-03-27: 500 mg via TOPICAL

## 2021-03-27 MED ORDER — FENTANYL CITRATE (PF) 100 MCG/2ML IJ SOLN
INTRAMUSCULAR | Status: DC | PRN
  Administered 2021-03-27: 50 ug via INTRAVENOUS
  Administered 2021-03-27: 25 ug via INTRAVENOUS
  Administered 2021-03-27: 50 ug via INTRAVENOUS
  Administered 2021-03-27: 25 ug via INTRAVENOUS

## 2021-03-27 MED ORDER — MAGTRACE LYMPHATIC TRACER
INTRAMUSCULAR | Status: DC | PRN
  Administered 2021-03-27: 2 mL via INTRAMUSCULAR

## 2021-03-27 MED ORDER — OXYCODONE HCL 5 MG PO TABS: 5.0000 mg | ORAL_TABLET | Freq: Once | ORAL | Status: DC | PRN

## 2021-03-27 MED ORDER — SODIUM CHLORIDE 0.9 % IV SOLN
INTRAVENOUS | Status: AC
  Filled 2021-03-27: qty 10

## 2021-03-27 MED ORDER — LACTATED RINGERS IV SOLN: INTRAVENOUS | Status: DC | PRN

## 2021-03-27 SURGICAL SUPPLY — 55 items
ADH SKN CLS APL DERMABOND .7 (GAUZE/BANDAGES/DRESSINGS) ×1
APL PRP STRL LF DISP 70% ISPRP (MISCELLANEOUS) ×1
APPLIER CLIP 9.375 MED OPEN (MISCELLANEOUS) ×2
APR CLP MED 9.3 20 MLT OPN (MISCELLANEOUS) ×1
BAG DECANTER FOR FLEXI CONT (MISCELLANEOUS) ×1 IMPLANT
BINDER BREAST LRG (GAUZE/BANDAGES/DRESSINGS) IMPLANT
BINDER BREAST MEDIUM (GAUZE/BANDAGES/DRESSINGS) IMPLANT
BINDER BREAST XLRG (GAUZE/BANDAGES/DRESSINGS) IMPLANT
BINDER BREAST XXLRG (GAUZE/BANDAGES/DRESSINGS) ×1 IMPLANT
BLADE SURG 15 STRL LF DISP TIS (BLADE) ×1 IMPLANT
BLADE SURG 15 STRL SS (BLADE) ×2
CANISTER SUC SOCK COL 7IN (MISCELLANEOUS) IMPLANT
CANISTER SUCT 1200ML W/VALVE (MISCELLANEOUS) ×2 IMPLANT
CHLORAPREP W/TINT 26 (MISCELLANEOUS) ×2 IMPLANT
CLIP APPLIE 9.375 MED OPEN (MISCELLANEOUS) ×1 IMPLANT
COVER BACK TABLE 60X90IN (DRAPES) ×2 IMPLANT
COVER MAYO STAND STRL (DRAPES) ×2 IMPLANT
COVER PROBE W GEL 5X96 (DRAPES) ×3 IMPLANT
DECANTER SPIKE VIAL GLASS SM (MISCELLANEOUS) IMPLANT
DERMABOND ADVANCED (GAUZE/BANDAGES/DRESSINGS) ×1
DERMABOND ADVANCED .7 DNX12 (GAUZE/BANDAGES/DRESSINGS) ×1 IMPLANT
DRAPE LAPAROSCOPIC ABDOMINAL (DRAPES) ×2 IMPLANT
DRAPE UTILITY XL STRL (DRAPES) ×2 IMPLANT
ELECT COATED BLADE 2.86 ST (ELECTRODE) ×2 IMPLANT
ELECT REM PT RETURN 9FT ADLT (ELECTROSURGICAL) ×2
ELECTRODE REM PT RTRN 9FT ADLT (ELECTROSURGICAL) ×1 IMPLANT
GLOVE SRG 8 PF TXTR STRL LF DI (GLOVE) ×1 IMPLANT
GLOVE SURG LTX SZ8 (GLOVE) ×2 IMPLANT
GLOVE SURG UNDER POLY LF SZ7 (GLOVE) ×1 IMPLANT
GLOVE SURG UNDER POLY LF SZ8 (GLOVE) ×4
GOWN STRL REUS W/ TWL LRG LVL3 (GOWN DISPOSABLE) ×2 IMPLANT
GOWN STRL REUS W/ TWL XL LVL3 (GOWN DISPOSABLE) ×1 IMPLANT
GOWN STRL REUS W/TWL 2XL LVL3 (GOWN DISPOSABLE) ×1 IMPLANT
GOWN STRL REUS W/TWL LRG LVL3 (GOWN DISPOSABLE) ×2
GOWN STRL REUS W/TWL XL LVL3 (GOWN DISPOSABLE) ×2
HEMOSTAT ARISTA ABSORB 3G PWDR (HEMOSTASIS) ×1 IMPLANT
HEMOSTAT SNOW SURGICEL 2X4 (HEMOSTASIS) IMPLANT
KIT MARKER MARGIN INK (KITS) ×2 IMPLANT
NDL HYPO 25X1 1.5 SAFETY (NEEDLE) ×1 IMPLANT
NDL SAFETY ECLIPSE 18X1.5 (NEEDLE) IMPLANT
NEEDLE HYPO 18GX1.5 SHARP (NEEDLE)
NEEDLE HYPO 25X1 1.5 SAFETY (NEEDLE) ×4 IMPLANT
NS IRRIG 1000ML POUR BTL (IV SOLUTION) ×2 IMPLANT
PACK BASIN DAY SURGERY FS (CUSTOM PROCEDURE TRAY) ×2 IMPLANT
PENCIL SMOKE EVACUATOR (MISCELLANEOUS) ×2 IMPLANT
SLEEVE SCD COMPRESS KNEE MED (STOCKING) ×2 IMPLANT
SPONGE T-LAP 4X18 ~~LOC~~+RFID (SPONGE) ×2 IMPLANT
SUT MNCRL AB 4-0 PS2 18 (SUTURE) ×3 IMPLANT
SUT VICRYL 3-0 CR8 SH (SUTURE) ×2 IMPLANT
SYR CONTROL 10ML LL (SYRINGE) ×3 IMPLANT
TOWEL GREEN STERILE FF (TOWEL DISPOSABLE) ×2 IMPLANT
TRACER MAGTRACE VIAL (MISCELLANEOUS) ×1 IMPLANT
TRAY FAXITRON CT DISP (TRAY / TRAY PROCEDURE) ×2 IMPLANT
TUBE CONNECTING 20X1/4 (TUBING) ×2 IMPLANT
YANKAUER SUCT BULB TIP NO VENT (SUCTIONS) ×2 IMPLANT

## 2021-03-27 NOTE — Interval H&P Note (Signed)
History and Physical Interval Note:  03/27/2021 12:02 PM  Laurie Mckenzie  has presented today for surgery, with the diagnosis of LEFT BREAST CANCER.  The various methods of treatment have been discussed with the patient and family. After consideration of risks, benefits and other options for treatment, the patient has consented to  Procedure(s): LEFT BREAST LUMPECTOMY WITH RADIOACTIVE SEED AND SENTINEL LYMPH NODE BIOPSY (Left) as a surgical intervention.  The patient's history has been reviewed, patient examined, no change in status, stable for surgery.  I have reviewed the patient's chart and labs.  Questions were answered to the patient's satisfaction.     [place]

## 2021-03-27 NOTE — Anesthesia Postprocedure Evaluation (Signed)
Anesthesia Post Note  Patient: Laurie Mckenzie  Procedure(s) Performed: LEFT BREAST LUMPECTOMY WITH RADIOACTIVE SEED AND SENTINEL LYMPH NODE BIOPSY (Left: Breast)     Patient location during evaluation: PACU Anesthesia Type: General Level of consciousness: awake and alert Pain management: pain level controlled Vital Signs Assessment: post-procedure vital signs reviewed and stable Respiratory status: spontaneous breathing, nonlabored ventilation, respiratory function stable and patient connected to nasal cannula oxygen Cardiovascular status: blood pressure returned to baseline and stable Postop Assessment: no apparent nausea or vomiting Anesthetic complications: no   No notable events documented.  Last Vitals:  Vitals:   03/27/21 1415 03/27/21 1435  BP: 140/75 (!) 153/82  Pulse: 85 82  Resp: 11 16  Temp:  36.6 C  SpO2: 92% 95%    Last Pain:  Vitals:   03/27/21 1435  TempSrc:   PainSc: 0-No pain                 Jehan Bonano S

## 2021-03-27 NOTE — Progress Notes (Signed)
[facility] Note  04/02/2021     CHIEF COMPLAINT Patient presents for Retina Follow Up   HISTORY OF PRESENT ILLNESS: Laurie Mckenzie is a 70 y.o. female who presents to the clinic today for:   HPI     Retina Follow Up   Patient presents with  Other.  In both eyes.  Duration of 3 months.  Since onset it is stable.  I, the attending physician,  performed the HPI with the patient and updated documentation appropriately.        Comments   3 month follow up ERM OU- Vision appears the same OU, no new problems.  Dx breast cancer, had surgery 03/27/21 to remove 2 lymph nodes and tumor.  She will start radiation in about a month.        Last edited by Bernarda Caffey, MD on 04/06/2021  1:20 AM.    Pt states vision is stable, pt had sx last Thursday for breast cancer, she starts radiation next month  Referring physician: Leticia Clas, [facility] [address]  [place], [place] [address]  HISTORICAL INFORMATION:   Selected notes from the MEDICAL RECORD NUMBER Referred by Dr. Rosana Hoes LEE: 06.28.22 Ocular Hx- ERM OU, VMT OS, Cataracts OU. VA OD 20/20, OS 20/30    CURRENT MEDICATIONS: No current outpatient medications on file. (Ophthalmic Drugs)   No current facility-administered medications for this visit. (Ophthalmic Drugs)   Current Outpatient Medications (Other)  Medication Sig   acetaminophen (TYLENOL) 500 MG tablet Take 1,000-1,500 mg by mouth every 6 (six) hours as needed for moderate pain or headache.    amLODipine (NORVASC) 2.5 MG tablet Take 1 tablet (2.5 mg total) by mouth daily.   atorvastatin (LIPITOR) 20 MG tablet Take 1 tablet (20 mg total) by mouth daily.   calcium carbonate (OSCAL) 1500 (600 Ca) MG TABS tablet Take 600 mg by mouth 2 (two) times daily with a meal.   Cholecalciferol (VITAMIN D3 PO) Take 1 capsule by mouth daily.   diphenhydrAMINE (BENADRYL) 25 MG tablet Take 25 mg by mouth once.   ibuprofen (ADVIL) 800 MG tablet Take 1 tablet  (800 mg total) by mouth every 8 (eight) hours as needed.   levothyroxine (SYNTHROID) 150 MCG tablet Take 1 tablet (150 mcg total) by mouth daily.   montelukast (SINGULAIR) 10 MG tablet Take 1 tablet (10 mg total) by mouth at bedtime.   Multiple Vitamin (MULTIVITAMIN) tablet Take 1 tablet by mouth daily.   triamcinolone (NASACORT) 55 MCG/ACT AERO nasal inhaler Place 2 sprays into the nose 2 (two) times daily.   triamcinolone cream (KENALOG) 0.1 % Apply 1 application topically 2 (two) times daily.   EPINEPHrine 0.3 mg/0.3 mL IJ SOAJ injection Inject 0.3 mLs (0.3 mgttotal) into the muscleoonce for 1 dose. (Patient not taking: No sig reported)   HYDROcodone-acetaminophen (NORCO/VICODIN) 5-325 MG tablet Take 1 tablet by mouth every 6 (six) hours as needed for moderate pain. (Patient not taking: Reported on 04/02/2021)   No current facility-administered medications for this visit. (Other)   REVIEW OF SYSTEMS: ROS   Positive for: Musculoskeletal, Endocrine, Cardiovascular, Eyes Negative for: Constitutional, Gastrointestinal, Neurological, Skin, Genitourinary, HENT, Respiratory, Psychiatric, Allergic/Imm, Heme/Lymph Last edited by Leonie Douglas, COA on 04/02/2021  7:46 AM.    ALLERGIES Allergies  Allergen Reactions   Sulfa Antibiotics Other (See Comments)   Bee Venom Swelling and Other (See Comments)    Red and hot to site   Fish Allergy  Hives   Ivp Dye [Iodinated Diagnostic Agents] Other (See Comments)    MD advised not to use, due to fish allergies   PAST MEDICAL HISTORY Past Medical History:  Diagnosis Date   Headache    Hypertension    186/106 today    195/108 07/27/2017   Hypothyroidism    Primary osteoarthritis of knee    Right   Past Surgical History:  Procedure Laterality Date   BREAST LUMPECTOMY WITH RADIOACTIVE SEED AND SENTINEL LYMPH NODE BIOPSY Left 03/27/2021   Procedure: LEFT BREAST LUMPECTOMY WITH RADIOACTIVE SEED AND SENTINEL LYMPH NODE BIOPSY;  Surgeon: Erroll Luna,  MD;  Location: [facility];  Service: General;  Laterality: Left;   COLONOSCOPY N/A 01/16/2015   Procedure: COLONOSCOPY;  Surgeon: Daneil Dolin, MD;  [facility];  Service: Endoscopy;  Laterality: N/A;  8:30 AM   KNEE ARTHROPLASTY Left 2011   [place]   TOTAL KNEE ARTHROPLASTY Right 08/23/2017   Procedure: TOTAL KNEE ARTHROPLASTY;  Surgeon: Elsie Saas, MD;  Location: [facility];  Service: Orthopedics;  Laterality: Right;   FAMILY HISTORY Family History  Problem Relation Age of Onset   Hypertension Mother    Lung disease Father    SOCIAL HISTORY Social History   Tobacco Use   Smoking status: Never   Smokeless tobacco: Never  Vaping Use   Vaping Use: Never used  Substance Use Topics   Alcohol use: No   Drug use: No       OPHTHALMIC EXAM: Base Eye Exam     Visual Acuity (Snellen - Linear)       Right Left   Dist cc 20/20 -1 20/40 +2   Dist ph cc  NI         Tonometry (Tonopen, 7:52 AM)       Right Left   Pressure 17 18         Pupils       Dark Light Shape React APD   Right 4 3 Round Brisk None   Left 4 3 Round Brisk None         Visual Fields (Counting fingers)       Left Right    Full Full         Extraocular Movement       Right Left    Full Full         Neuro/Psych     Oriented x3: Yes   Mood/Affect: Normal         Dilation     Both eyes: 1.0% Mydriacyl, 2.5% Phenylephrine @ 7:52 AM           Slit Lamp and Fundus Exam     Slit Lamp Exam       Right Left   Lids/Lashes Dermatochalasis - upper lid Dermatochalasis - upper lid   Conjunctiva/Sclera White and quiet White and quiet   Cornea trace PEE trace PEE   Anterior Chamber deep, clear, narrow angles deep, clear, narrow angles   Iris Round and dilated Round and dilated   Lens 2+ Nuclear sclerosis, 2+ Cortical cataract 2+ Nuclear sclerosis, 2+ Cortical cataract   Vitreous Vitreous syneresis, Posterior vitreous detachment Vitreous syneresis,  Posterior vitreous detachment, vitreous condensations         Fundus Exam       Right Left   Disc Pink and Sharp, mild PPA/PPP Pink and Sharp   C/D Ratio 0.3 0.3   Macula Flat, Blunted foveal reflex, ERM with  early striae, no heme or edema Flat, Blunted foveal reflex, ERM with striae and central thickening, no heme   Vessels mild attenuation, mild tortuousity mild attenuation, mild tortuousity   Periphery Attached; no heme; no RT/RD Attached; no heme             Refraction     Wearing Rx       Sphere Cylinder Axis Add   Right +2.25 +1.00 153 +2.00   Left +1.75 +0.50 178 +2.00            IMAGING AND PROCEDURES  Imaging and Procedures for 04/02/2021  OCT, Retina - OU - Both Eyes       Right Eye Quality was good. Central Foveal Thickness: 364. Progression has been stable. Findings include no IRF, no SRF, macular pucker, epiretinal membrane, abnormal foveal contour, retinal drusen (No significant change from prior).   Left Eye Quality was good. Central Foveal Thickness: 483. Progression has been stable. Findings include abnormal foveal contour, no IRF, no SRF, epiretinal membrane, macular pucker (No significant change from prior).   Notes *Images captured and stored on drive  Diagnosis / Impression:  ERM with pucker OU (OS>OD) No significant change from prior OU  Clinical management:  See below  Abbreviations: NFP - Normal foveal profile. CME - cystoid macular edema. PED - pigment epithelial detachment. IRF - intraretinal fluid. SRF - subretinal fluid. EZ - ellipsoid zone. ERM - epiretinal membrane. ORA - outer retinal atrophy. ORT - outer retinal tubulation. SRHM - subretinal hyper-reflective material. IRHM - intraretinal hyper-reflective material            ASSESSMENT/PLAN:    ICD-10-CM   1. Epiretinal membrane (ERM) of both eyes  H35.373     2. Retinal edema  H35.81 OCT, Retina - OU - Both Eyes    3. Essential hypertension  I10     4. Hypertensive  retinopathy of both eyes  H35.033     5. Combined forms of age-related cataract of both eyes  H25.813     1,2. Epiretinal membrane, both eyes (OS>OD)  - pt reports history of ERM dates back to 2018-2019 -- initially noted by Dr. Radford Pax - ERM w/ pucker OU (OS > OD) -- OS with loss of foveal contour and +central thickening - BCVA OD: 20/20, OS: 20/40 - OCT w/o IRF/SRF and ERMs stable from prior - mild blurring OU, no metamorphopsia - no indication for surgery at this time - monitor for now - f/u 4-6 mos- DFE/OCT  3,4. Hypertensive retinopathy OU - discussed importance of tight BP control - monitor  5. Mixed Cataract OU - The symptoms of cataract, surgical options, and treatments and risks were discussed with patient. - discussed diagnosis and progression - discussed recommendation of having cataract surgery prior to any retina surgery, if possible - monitor  Ophthalmic Meds Ordered this visit:  No orders of the defined types were placed in this encounter.    Return for f/u 4-6 months ERM OU, DFE, OCT.  There are no Patient Instructions on file for this visit.   This document serves as a record of services personally performed by Gardiner Sleeper, MD, PhD. It was created on their behalf by Orvan Falconer, an ophthalmic technician. The creation of this record is the provider's dictation and/or activities during the visit.    Electronically signed by: Orvan Falconer, OA, 04/06/21  1:20 AM   This document serves as a record of services personally performed by Gardiner Sleeper, MD, PhD.  It was created on their behalf by San Jetty. Owens Shark, OA an ophthalmic technician. The creation of this record is the provider's dictation and/or activities during the visit.    Electronically signed by: San Jetty. Owens Shark, OA 10.05.2022 1:20 AM  Gardiner Sleeper, M.D., Ph.D. Diseases & Surgery of the Retina and Vitreous [facility]  I have reviewed the above documentation for  accuracy and completeness, and I agree with the above. Gardiner Sleeper, M.D., Ph.D. 04/06/21 1:22 AM   Abbreviations: M myopia (nearsighted); A astigmatism; H hyperopia (farsighted); P presbyopia; Mrx spectacle prescription;  CTL contact lenses; OD right eye; OS left eye; OU both eyes  XT exotropia; ET esotropia; PEK punctate epithelial keratitis; PEE punctate epithelial erosions; DES dry eye syndrome; MGD meibomian gland dysfunction; ATs artificial tears; PFAT's preservative free artificial tears; [place] nuclear sclerotic cataract; PSC posterior subcapsular cataract; ERM epi-retinal membrane; PVD posterior vitreous detachment; RD retinal detachment; DM diabetes mellitus; DR diabetic retinopathy; NPDR non-proliferative diabetic retinopathy; PDR proliferative diabetic retinopathy; CSME clinically significant macular edema; DME diabetic macular edema; dbh dot blot hemorrhages; CWS cotton wool spot; POAG primary open angle glaucoma; C/D cup-to-disc ratio; HVF humphrey visual field; GVF goldmann visual field; OCT optical coherence tomography; IOP intraocular pressure; BRVO Branch retinal vein occlusion; CRVO central retinal vein occlusion; CRAO central retinal artery occlusion; BRAO branch retinal artery occlusion; RT retinal tear; SB scleral buckle; PPV pars plana vitrectomy; VH Vitreous hemorrhage; PRP panretinal laser photocoagulation; IVK intravitreal kenalog; VMT vitreomacular traction; MH Macular hole;  NVD neovascularization of the disc; NVE neovascularization elsewhere; AREDS age related eye disease study; ARMD age related macular degeneration; POAG primary open angle glaucoma; EBMD epithelial/anterior basement membrane dystrophy; ACIOL anterior chamber intraocular lens; IOL intraocular lens; PCIOL posterior chamber intraocular lens; Phaco/IOL phacoemulsification with intraocular lens placement; [place] photorefractive keratectomy; LASIK laser assisted in situ keratomileusis; HTN hypertension; DM diabetes mellitus;  COPD chronic obstructive pulmonary disease

## 2021-03-27 NOTE — Anesthesia Procedure Notes (Signed)
Anesthesia Regional Block: Pectoralis block   Pre-Anesthetic Checklist: , timeout performed,  Correct Patient, Correct Site, Correct Laterality,  Correct Procedure, Correct Position, site marked,  Risks and benefits discussed,  Surgical consent,  Pre-op evaluation,  At surgeon's request and post-op pain management  Laterality: Left  Prep: chloraprep       Needles:  Injection technique: Single-shot  Needle Type: Echogenic Needle     Needle Length: 9cm  Needle Gauge: 21     Additional Needles:   Narrative:  Start time: 03/27/2021 10:35 AM End time: 03/27/2021 10:45 AM Injection made incrementally with aspirations every 5 mL.  Performed by: Personally  Anesthesiologist: Albertha Ghee, MD  Additional Notes: Pt tolerated the procedure well.

## 2021-03-27 NOTE — Transfer of Care (Signed)
Immediate Anesthesia Transfer of Care Note  Patient: Laurie Mckenzie  Procedure(s) Performed: LEFT BREAST LUMPECTOMY WITH RADIOACTIVE SEED AND SENTINEL LYMPH NODE BIOPSY (Left: Breast)  Patient Location: PACU  Anesthesia Type:GA combined with regional for post-op pain  Level of Consciousness: sedated  Airway & Oxygen Therapy: Patient Spontanous Breathing and Patient connected to face mask oxygen  Post-op Assessment: Report given to RN and Post -op Vital signs reviewed and stable  Post vital signs: Reviewed and stable  Last Vitals:  Vitals Value Taken Time  BP 144/75 03/27/21 1352  Temp 36.7 C 03/27/21 1352  Pulse 82 03/27/21 1354  Resp 25 03/27/21 1355  SpO2 92 % 03/27/21 1354  Vitals shown include unvalidated device data.  Last Pain:  Vitals:   03/27/21 1352  TempSrc:   PainSc: Asleep         Complications: No notable events documented.

## 2021-03-27 NOTE — Progress Notes (Signed)
Assisted Dr. Marcie Bal with left, pectoralis block. Side rails up, monitors on throughout procedure. See vital signs in flow sheet. Tolerated Procedure well.

## 2021-03-27 NOTE — H&P (Signed)
History of Present Illness: Laurie Mckenzie is a 70 y.o. female who is seen today as an office consultation at the request of Dr. Jana Hakim for evaluation of Breast Cancer .   Patient seen today in the [facility] for left breast cancer. She was noted to have a 1.2 cm spiculated mass on recent screening mammogram left breast upper outer quadrant. 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 No symptoms were noted by the patient. No family history of breast cancer noted by the patient.  Review of Systems: A complete review of systems was obtained from the patient. I have reviewed this information and discussed as appropriate with the patient. See HPI as well for other ROS.  ROS   Medical History: Past Medical History:  Diagnosis Date   Headache   Hyperlipidemia   Hypertension   Hypothyroidism  Date Unknown   Primary osteoarthritis of knee   Patient Active Problem List  Diagnosis   Malignant neoplasm of upper-outer quadrant of left breast in female, estrogen receptor positive (CMS-HCC)   Hyperlipidemia, unspecified   Past Surgical History:  Procedure Laterality Date   ARTHROPLASTY TOTAL KNEE Right  08/23/2017   COLONOSCOPY N/A  01/16/2015   Knee Arthroplasty, left Left  2011    Allergies  Allergen Reactions   Fish Containing Products Hives   Iodinated Contrast Media Other (See Comments)  MD advised not to use, due to fish allergies   Sulfa (Sulfonamide Antibiotics) Other (See Comments)   Venom-Honey Bee Other (See Comments) and Swelling  Red and hot to site   Current Outpatient Medications on File Prior to Visit  Medication Sig Dispense Refill   amLODIPine (NORVASC) 2.5 MG tablet Take by mouth   atorvastatin (LIPITOR) 20 MG tablet Take 20 mg by mouth once daily   EPINEPHrine (EPIPEN) 0.3 mg/0.3 mL auto-injector Inject 0.3 mLs (0.3 mgttotal) into the muscleoonce for 1 dose.   levothyroxine (SYNTHROID)  150 MCG tablet Take by mouth   montelukast (SINGULAIR) 10 mg tablet Take by mouth   acetaminophen (TYLENOL) 500 MG tablet Take by mouth   multivitamin (MULTIVITAMIN) tablet Take 1 tablet by mouth once daily   No current facility-administered medications on file prior to visit.   Family History  Problem Relation Age of Onset   High blood pressure (Hypertension) Mother   Diabetes Maternal Aunt    Social History   Tobacco Use  Smoking Status Never Smoker  Smokeless Tobacco Never Used    Social History   Socioeconomic History   Marital status: Unknown  Tobacco Use   Smoking status: Never Smoker   Smokeless tobacco: Never Used  Scientific laboratory technician Use: Never used  Substance and Sexual Activity   Alcohol use: Never   Drug use: Never   Sexual activity: Defer   Objective:  There were no vitals filed for this visit.  There is no height or weight on file to calculate BMI.  Physical Exam HENT:  Head: Normocephalic.  Mouth/Throat:  Mouth: Mucous membranes are moist.  Eyes:  Extraocular Movements: Extraocular movements intact.  Cardiovascular:  Rate and Rhythm: Normal rate and regular rhythm.  Pulmonary:  Effort: Pulmonary effort is normal.  Breath sounds: No stridor.  Musculoskeletal:  General: Normal range of motion.  Cervical back: Normal range of motion and neck supple.  Skin: General: Skin is warm and dry.  Neurological:  General: No focal deficit present.  Mental Status: She is alert.  Psychiatric:  Mood and Affect: Mood normal.  Behavior: Behavior normal.     Labs, Imaging and Diagnostic Testing: Mammogram [facility] shows left breast upper outer quadrant 1.2 cm spiculated mass no lymphadenopathy noted  Assessment and Plan:  Diagnoses and all orders for this visit:  Malignant neoplasm of upper-outer quadrant of left breast in female, estrogen receptor positive (CMS-HCC)    Discussed surgical options today. Discussed breast conserving surgery versus  mastectomy. Discussed the role of reconstruction with mastectomy. Discussed the pros and cons of breast conserving surgery with sentinel lymph node mapping her [place]. After the pros and cons of both options were discussed today as well as complications, recurrence rates, survival and long-term expectations, she is opted for left breast seed localized lumpectomy with left axillary sentinel lymph node mapping. Risk bleeding, infection, cosmetic deformity, arm swelling, injury to major blood vessels or nerves, decreased range of motion stiffness of the shoulder joint, and the need for treatment center procedures discussed today. Use of seed and sentinel lymph node mapping injections discussed. She agrees to proceed with breast conserving surgery.  No follow-ups on file.  Kennieth Francois, MD   I spent a total of 45 minutes in both face-to-face and non-face-to-face activities for this visit on the date of this encounter.

## 2021-03-27 NOTE — Anesthesia Procedure Notes (Signed)
Procedure Name: LMA Insertion Date/Time: 03/27/2021 12:14 PM Performed by: Glory Buff, CRNA Pre-anesthesia Checklist: Patient identified, Emergency Drugs available, Suction available and Patient being monitored Patient Re-evaluated:Patient Re-evaluated prior to induction Oxygen Delivery Method: Circle system utilized Preoxygenation: Pre-oxygenation with 100% oxygen Induction Type: IV induction LMA: LMA inserted LMA Size: 4.0 Number of attempts: 1 Placement Confirmation: positive ETCO2 Tube secured with: Tape Dental Injury: Teeth and Oropharynx as per pre-operative assessment

## 2021-03-27 NOTE — Discharge Instructions (Addendum)
[facility] Office Phone Number [phone]  BREAST BIOPSY/ PARTIAL MASTECTOMY: POST OP INSTRUCTIONS  Always review your discharge instruction sheet given to you by the facility where your surgery was performed.  IF YOU HAVE DISABILITY OR FAMILY LEAVE FORMS, YOU MUST BRING THEM TO THE OFFICE FOR PROCESSING.  DO NOT GIVE THEM TO YOUR DOCTOR.  A prescription for pain medication may be given to you upon discharge.  Take your pain medication as prescribed, if needed.  If narcotic pain medicine is not needed, then you may take acetaminophen (Tylenol) or ibuprofen (Advil) as needed. Take your usually prescribed medications unless otherwise directed If you need a refill on your pain medication, please contact your pharmacy.  They will contact our office to request authorization.  Prescriptions will not be filled after 5pm or on week-ends. You should eat very light the first 24 hours after surgery, such as soup, crackers, pudding, etc.  Resume your normal diet the day after surgery. Most patients will experience some swelling and bruising in the breast.  Ice packs and a good support bra will help.  Swelling and bruising can take several days to resolve.  It is common to experience some constipation if taking pain medication after surgery.  Increasing fluid intake and taking a stool softener will usually help or prevent this problem from occurring.  A mild laxative (Milk of Magnesia or Miralax) should be taken according to package directions if there are no bowel movements after 48 hours. Unless discharge instructions indicate otherwise, you may remove your bandages 24-48 hours after surgery, and you may shower at that time.  You may have steri-strips (small skin tapes) in place directly over the incision.  These strips should be left on the skin for 7-10 days.  If your surgeon used skin glue on the incision, you may shower in 24 hours.  The glue will flake off over the next 2-3 weeks.  Any  sutures or staples will be removed at the office during your follow-up visit. ACTIVITIES:  You may resume regular daily activities (gradually increasing) beginning the next day.  Wearing a good support bra or sports bra minimizes pain and swelling.  You may have sexual intercourse when it is comfortable. You may drive when you no longer are taking prescription pain medication, you can comfortably wear a seatbelt, and you can safely maneuver your car and apply brakes. RETURN TO WORK:  ______________________________________________________________________________________ You should see your doctor in the office for a follow-up appointment approximately two weeks after your surgery.  Your doctor's nurse will typically make your follow-up appointment when she calls you with your pathology report.  Expect your pathology report 2-3 business days after your surgery.  You may call to check if you do not hear from us after three days. OTHER INSTRUCTIONS: _______________________________________________________________________________________________ _____________________________________________________________________________________________________________________________________ _____________________________________________________________________________________________________________________________________ _____________________________________________________________________________________________________________________________________  WHEN TO CALL YOUR DOCTOR: Fever over 101.0 Nausea and/or vomiting. Extreme swelling or bruising. Continued bleeding from incision. Increased pain, redness, or drainage from the incision.  The clinic staff is available to answer your questions during regular business hours.  Please don't hesitate to call and ask to speak to one of the nurses for clinical concerns.  If you have a medical emergency, go to the nearest emergency room or call 911.  A surgeon from [facility] is always on call at the hospital.  For further questions, please visit [email or web address]    Post Anesthesia Home Care Instructions  Activity: Get plenty of rest for the remainder of   the day. A responsible individual must stay with you for 24 hours following the procedure.  For the next 24 hours, DO NOT: -Drive a car -Operate machinery -Drink alcoholic beverages -Take any medication unless instructed by your physician -Make any legal decisions or sign important papers.  Meals: Start with liquid foods such as gelatin or soup. Progress to regular foods as tolerated. Avoid greasy, spicy, heavy foods. If nausea and/or vomiting occur, drink only clear liquids until the nausea and/or vomiting subsides. Call your physician if vomiting continues.  Special Instructions/Symptoms: Your throat may feel dry or sore from the anesthesia or the breathing tube placed in your throat during surgery. If this causes discomfort, gargle with warm salt water. The discomfort should disappear within 24 hours.  If you had a scopolamine patch placed behind your ear for the management of post- operative nausea and/or vomiting:  1. The medication in the patch is effective for 72 hours, after which it should be removed.  Wrap patch in a tissue and discard in the trash. Wash hands thoroughly with soap and water. 2. You may remove the patch earlier than 72 hours if you experience unpleasant side effects which may include dry mouth, dizziness or visual disturbances. 3. Avoid touching the patch. Wash your hands with soap and water after contact with the patch.      

## 2021-03-27 NOTE — Anesthesia Preprocedure Evaluation (Addendum)
Anesthesia Evaluation  Patient identified by MRN, date of birth, ID band Patient awake    Reviewed: Allergy & Precautions, H&P , NPO status , Patient's Chart, lab work & pertinent test results  Airway Mallampati: II   Neck ROM: full    Dental   Pulmonary neg pulmonary ROS,    breath sounds clear to auscultation       Cardiovascular hypertension,  Rhythm:regular Rate:Normal     Neuro/Psych  Headaches,    GI/Hepatic   Endo/Other  Hypothyroidism   Renal/GU      Musculoskeletal  (+) Arthritis ,   Abdominal   Peds  Hematology   Anesthesia Other Findings   Reproductive/Obstetrics                             Anesthesia Physical Anesthesia Plan  ASA: 2  Anesthesia Plan: General   Post-op Pain Management:  Regional for Post-op pain   Induction: Intravenous  PONV Risk Score and Plan: 3 and Ondansetron, Dexamethasone, Midazolam and Treatment may vary due to age or medical condition  Airway Management Planned: LMA  Additional Equipment:   Intra-op Plan:   Post-operative Plan: Extubation in OR  Informed Consent: I have reviewed the patients History and Physical, chart, labs and discussed the procedure including the risks, benefits and alternatives for the proposed anesthesia with the patient or authorized representative who has indicated his/her understanding and acceptance.     Dental advisory given  Plan Discussed with: CRNA, Anesthesiologist and Surgeon  Anesthesia Plan Comments:         Anesthesia Quick Evaluation

## 2021-03-27 NOTE — Op Note (Signed)
Preoperative diagnosis: Left breast cancer stage I upper outer quadrant  Postoperative diagnosis: Same  Procedure: Left breast seed localized lumpectomy with left axillary sentinel lymph node mapping using mag trace  Surgeon: Erroll Luna, MD  Anesthesia: General with 0.25% Marcaine plain and pectoral block  EBL: 30 cc  Specimen: Left breast seed clip and tissue as lumpectomy specimen verified by Faxitron and oriented with ink +2 left axillary sentinel nodes  Drains: None  Indications for procedure: The patient is a 70 year old female with stage I left breast cancer upper outer quadrant.  She is evaluated by medical oncology and radiation oncology.  She opted for breast conserving surgery.The procedure has been discussed with the patient. Alternatives to surgery have been discussed with the patient.  Risks of surgery include bleeding,  Infection,  Seroma formation, death,  and the need for further surgery.   The patient understands and wishes to proceed. Sentinel lymph node mapping and dissection has been discussed with the patient.  Risk of bleeding,  Infection,  Seroma formation,  Additional procedures,,  Shoulder weakness ,  Shoulder stiffness,  Nerve and blood vessel injury and reaction to the mapping dyes have been discussed.  Alternatives to surgery have been discussed with the patient.  The patient agrees to proceed.      Description of procedure: The patient was met in the holding area and questions were answered.  Left breast was marked as the correct site.  She underwent pectoral block on the left side.  Films were available for review and the seed was marked by the radiologist in the left breast.  She was then taken back to the operating.  She was placed supine upon the OR table.  After induction of general anesthesia, we injected 3 cc of mag trace under sterile conditions after timeout and massaged for 5 minutes.  She was then prepped and draped in a sterile fashion.  A second  timeout was taken.  Films were available for review.  Neoprobe used to identify the seed left breast upper outer quadrant.  Incision made in the left axilla on the lateral aspect of the breast in a transverse fashion in the upper quadrant.  Dissection was carried down all tissue around the seed and clip were excised with a grossly negative margin.  Hemostasis was achieved.  Cautery was used for that.  Clips were used to mark the cavity and vancomycin powder was placed into the lumpectomy cavity.  After ensuring hemostasis it was closed with 3-0 Vicryl and 4 Monocryl.  Dermabond was applied to that.  We used local anesthetic of 0.25% Marcaine for local anesthetic.  Using the mag trace probe, hotspot identified in left axilla.  Incision was made.  Dissection was carried down into the level 1 contents.  There were 2 deep level 1 nodes that pick the tracer up.  We removed both of these with the help of the probe.  Background counts approached baseline.  The long thoracic nerve, thoracodorsal trunk and axillary vein were preserved.  Hemostasis was achieved.  Vancomycin powder placed as well or as Arista.  Local anesthetic infiltrated.  Wound closed with deep layer 3-0 Vicryl and 4 Monocryl.  Dermabond is applied.  All counts were found to be correct.  Breast binder placed.  Patient was awoke extubated taken to recovery in satisfactory condition.

## 2021-03-28 ENCOUNTER — Telehealth: Payer: Self-pay | Admitting: *Deleted

## 2021-03-28 ENCOUNTER — Encounter: Payer: Self-pay | Admitting: *Deleted

## 2021-03-28 ENCOUNTER — Encounter (HOSPITAL_BASED_OUTPATIENT_CLINIC_OR_DEPARTMENT_OTHER): Payer: Self-pay | Admitting: Surgery

## 2021-03-28 NOTE — Progress Notes (Signed)
Left message stating courtesy call and if any questions or concerns please call the doctors office.  

## 2021-03-28 NOTE — Telephone Encounter (Signed)
Spoke with patient to follow up from [facility] 9/21 and assess navigation needs.  She states she is doing well since having surgery yesterday. Denies any pain. Encouraged her to call should anything arise. Patient verbalized understanding.

## 2021-04-02 ENCOUNTER — Encounter: Payer: Self-pay | Admitting: *Deleted

## 2021-04-02 ENCOUNTER — Other Ambulatory Visit: Payer: Self-pay

## 2021-04-02 ENCOUNTER — Ambulatory Visit (INDEPENDENT_AMBULATORY_CARE_PROVIDER_SITE_OTHER): Payer: Medicare Other | Admitting: Ophthalmology

## 2021-04-02 ENCOUNTER — Telehealth: Payer: Self-pay | Admitting: *Deleted

## 2021-04-02 DIAGNOSIS — H25813 Combined forms of age-related cataract, bilateral: Secondary | ICD-10-CM | POA: Diagnosis not present

## 2021-04-02 DIAGNOSIS — I10 Essential (primary) hypertension: Secondary | ICD-10-CM | POA: Diagnosis not present

## 2021-04-02 DIAGNOSIS — H35033 Hypertensive retinopathy, bilateral: Secondary | ICD-10-CM

## 2021-04-02 DIAGNOSIS — H3581 Retinal edema: Secondary | ICD-10-CM

## 2021-04-02 DIAGNOSIS — H35373 Puckering of macula, bilateral: Secondary | ICD-10-CM | POA: Diagnosis not present

## 2021-04-02 LAB — SURGICAL PATHOLOGY

## 2021-04-02 NOTE — Telephone Encounter (Signed)
Ordered oncotype per Dr. Magrinat. Faxed requisition to pathology and [facility] [facility]. 

## 2021-04-03 ENCOUNTER — Encounter: Payer: Self-pay | Admitting: Surgery

## 2021-04-06 ENCOUNTER — Encounter (INDEPENDENT_AMBULATORY_CARE_PROVIDER_SITE_OTHER): Payer: Self-pay | Admitting: Ophthalmology

## 2021-04-08 DIAGNOSIS — C50412 Malignant neoplasm of upper-outer quadrant of left female breast: Secondary | ICD-10-CM | POA: Diagnosis not present

## 2021-04-08 DIAGNOSIS — Z170 Estrogen receptor positive status [ER+]: Secondary | ICD-10-CM | POA: Diagnosis not present

## 2021-04-09 ENCOUNTER — Encounter: Payer: Self-pay | Admitting: *Deleted

## 2021-04-09 DIAGNOSIS — Z170 Estrogen receptor positive status [ER+]: Secondary | ICD-10-CM

## 2021-04-09 DIAGNOSIS — C50412 Malignant neoplasm of upper-outer quadrant of left female breast: Secondary | ICD-10-CM

## 2021-04-17 ENCOUNTER — Ambulatory Visit
Admission: RE | Admit: 2021-04-17 | Discharge: 2021-04-17 | Disposition: A | Discharge status: Home / Self Care | Payer: Self-pay | Source: Ambulatory Visit | Attending: Radiation Oncology | Admitting: Radiation Oncology

## 2021-04-17 ENCOUNTER — Inpatient Hospital Stay
Admission: RE | Admit: 2021-04-17 | Discharge: 2021-04-17 | Disposition: A | Discharge status: Home / Self Care | Payer: Self-pay | Source: Ambulatory Visit | Attending: Radiation Oncology | Admitting: Radiation Oncology

## 2021-04-17 ENCOUNTER — Encounter: Payer: Self-pay | Admitting: *Deleted

## 2021-04-17 ENCOUNTER — Other Ambulatory Visit: Payer: Self-pay | Admitting: Radiation Oncology

## 2021-04-17 DIAGNOSIS — Z170 Estrogen receptor positive status [ER+]: Secondary | ICD-10-CM

## 2021-04-17 DIAGNOSIS — C50412 Malignant neoplasm of upper-outer quadrant of left female breast: Secondary | ICD-10-CM

## 2021-04-18 ENCOUNTER — Encounter: Payer: Self-pay | Admitting: Oncology

## 2021-04-23 ENCOUNTER — Ambulatory Visit: Payer: Medicare Other

## 2021-04-23 ENCOUNTER — Ambulatory Visit: Payer: Medicare Other | Admitting: Radiation Oncology

## 2021-04-23 ENCOUNTER — Ambulatory Visit
Admission: RE | Admit: 2021-04-23 | Discharge: 2021-04-23 | Disposition: A | Discharge status: Home / Self Care | Payer: Medicare Other | Source: Ambulatory Visit | Attending: Radiation Oncology | Admitting: Radiation Oncology

## 2021-04-23 ENCOUNTER — Encounter: Payer: Self-pay | Admitting: Radiation Oncology

## 2021-04-23 ENCOUNTER — Other Ambulatory Visit: Payer: Self-pay

## 2021-04-23 VITALS — BP 160/87 | HR 86 | Temp 97.8°F | Resp 20 | Ht 64.5 in | Wt 229.8 lb

## 2021-04-23 DIAGNOSIS — Z170 Estrogen receptor positive status [ER+]: Secondary | ICD-10-CM | POA: Insufficient documentation

## 2021-04-23 DIAGNOSIS — Z79899 Other long term (current) drug therapy: Secondary | ICD-10-CM | POA: Insufficient documentation

## 2021-04-23 DIAGNOSIS — C50412 Malignant neoplasm of upper-outer quadrant of left female breast: Secondary | ICD-10-CM | POA: Diagnosis not present

## 2021-04-23 DIAGNOSIS — I10 Essential (primary) hypertension: Secondary | ICD-10-CM | POA: Diagnosis not present

## 2021-04-23 DIAGNOSIS — Z923 Personal history of irradiation: Secondary | ICD-10-CM | POA: Insufficient documentation

## 2021-04-23 DIAGNOSIS — E039 Hypothyroidism, unspecified: Secondary | ICD-10-CM | POA: Diagnosis not present

## 2021-04-23 DIAGNOSIS — M1990 Unspecified osteoarthritis, unspecified site: Secondary | ICD-10-CM | POA: Diagnosis not present

## 2021-04-23 NOTE — Progress Notes (Signed)
Patient reports LT axilla tenderness 2/10. No other symptoms reported at this time.  Meaningful use complete.  Menopause- No chances of pregnancy  BP (!) 160/87 (BP Location: Right Arm, Patient Position: Sitting, Cuff Size: Large)   Pulse 86   Temp 97.8 F (36.6 C)   Resp 20   Ht 5' 4.5" (1.638 m)   Wt 229 lb 12.8 oz (104.2 kg)   SpO2 98%   BMI 38.84 kg/m

## 2021-04-23 NOTE — Progress Notes (Signed)
Radiation Oncology         [phone]) [phone] ________________________________  Name: Laurie Mckenzie        MRN: [id number]  Date of Service: 04/23/2021 DOB: 07-29-1950  BW:IOMBTDHRC, Fransisca Kaufmann, MD  Magrinat, Virgie Dad, MD     REFERRING PHYSICIAN: Magrinat, Virgie Dad, MD   DIAGNOSIS: The encounter diagnosis was Malignant neoplasm of upper-outer quadrant of left breast in female, estrogen receptor positive ([place]).   HISTORY OF PRESENT ILLNESS: Laurie Mckenzie is a 70 y.o. female originally seen in the multidisciplinary breast clinic for a new diagnosis of left breast cancer. The patient was noted to have a screening detected group of calcifications and mass in the left breast. Diagnostic ultrasound on 03/07/21 measured a mass at 3:00 up to 1.1 cm. No axillary adenopathy was noted and she underwent a biopsy that showed grade 2 invasive ductal carcinoma that was ER/PR positive, HER2 was negative and her Ki 67 was 30%.   Since her last visit, she underwent left lumpectomy and sentinel lymph node biopsy on 03/27/2021.  Her tumor measured 9 mm and was grade 3, associated high-grade DCIS was noted and her margins were uninvolved but 1 mm to the inferior margin.  2 sampled lymph nodes were negative for metastatic disease.  Her Oncotype score was 24, it does not appear that chemotherapy is planned.  She is seen today to discuss adjuvant radiotherapy to the left breast.   PREVIOUS RADIATION THERAPY: No   PAST MEDICAL HISTORY:  Past Medical History:  Diagnosis Date   Headache    Hypertension    186/106 today    195/108 07/27/2017   Hypothyroidism    Primary osteoarthritis of knee    Right       PAST SURGICAL HISTORY: Past Surgical History:  Procedure Laterality Date   BREAST LUMPECTOMY WITH RADIOACTIVE SEED AND SENTINEL LYMPH NODE BIOPSY Left 03/27/2021   Procedure: LEFT BREAST LUMPECTOMY WITH RADIOACTIVE SEED AND SENTINEL LYMPH NODE BIOPSY;  Surgeon: Erroll Luna, MD;  Location: [facility];  Service: General;  Laterality: Left;   COLONOSCOPY N/A 01/16/2015   Procedure: COLONOSCOPY;  Surgeon: Daneil Dolin, MD;  [facility];  Service: Endoscopy;  Laterality: N/A;  8:30 AM   KNEE ARTHROPLASTY Left 2011   [place]   TOTAL KNEE ARTHROPLASTY Right 08/23/2017   Procedure: TOTAL KNEE ARTHROPLASTY;  Surgeon: Elsie Saas, MD;  Location: [facility];  Service: Orthopedics;  Laterality: Right;     FAMILY HISTORY:  Family History  Problem Relation Age of Onset   Hypertension Mother    Lung disease Father      SOCIAL HISTORY:  reports that she has never smoked. She has never used smokeless tobacco. She reports that she does not drink alcohol and does not use drugs. The patient is single and lives in [place]. She is accompanied by her friend Arville Go and they describe their relationship like chosen sisters. She is active in church and retired from a banking role, and enjoys mowing her property and [place] property.   ALLERGIES: Sulfa antibiotics, Bee venom, Fish allergy, and Ivp dye [iodinated diagnostic agents]   MEDICATIONS:  Current Outpatient Medications  Medication Sig Dispense Refill   acetaminophen (TYLENOL) 500 MG tablet Take 1,000-1,500 mg by mouth every 6 (six) hours as needed for moderate pain or headache.      amLODipine (NORVASC) 2.5 MG tablet Take 1 tablet (2.5 mg total) by mouth daily. 90 tablet 3   atorvastatin (  LIPITOR) 20 MG tablet Take 1 tablet (20 mg total) by mouth daily. 90 tablet 3   calcium carbonate (OSCAL) 1500 (600 Ca) MG TABS tablet Take 600 mg by mouth 2 (two) times daily with a meal.     Cholecalciferol (VITAMIN D3 PO) Take 1 capsule by mouth daily.     diphenhydrAMINE (BENADRYL) 25 MG tablet Take 25 mg by mouth once.     EPINEPHrine 0.3 mg/0.3 mL IJ SOAJ injection Inject 0.3 mLs (0.3 mgttotal) into the muscleoonce for 1 dose. (Patient not taking: No sig reported) 1 each 1   HYDROcodone-acetaminophen (NORCO/VICODIN) 5-325 MG  tablet Take 1 tablet by mouth every 6 (six) hours as needed for moderate pain. (Patient not taking: Reported on 04/02/2021) 15 tablet 0   ibuprofen (ADVIL) 800 MG tablet Take 1 tablet (800 mg total) by mouth every 8 (eight) hours as needed. 30 tablet 0   levothyroxine (SYNTHROID) 150 MCG tablet Take 1 tablet (150 mcg total) by mouth daily. 90 tablet 3   montelukast (SINGULAIR) 10 MG tablet Take 1 tablet (10 mg total) by mouth at bedtime. 90 tablet 3   Multiple Vitamin (MULTIVITAMIN) tablet Take 1 tablet by mouth daily.     triamcinolone (NASACORT) 55 MCG/ACT AERO nasal inhaler Place 2 sprays into the nose 2 (two) times daily.     triamcinolone cream (KENALOG) 0.1 % Apply 1 application topically 2 (two) times daily. 30 g 0   No current facility-administered medications for this encounter.     REVIEW OF SYSTEMS: On review of systems, the patient reports she is doing very well since her surgery.  She states that she has had some tenderness along the left axilla but otherwise has been feeling quite well and is hoping to get back to her usual activities.     PHYSICAL EXAM:  Wt Readings from Last 3 Encounters:  04/23/21 229 lb 12.8 oz (104.2 kg)  03/27/21 225 lb 15.5 oz (102.5 kg)  03/19/21 225 lb 11.2 oz (102.4 kg)   Temp Readings from Last 3 Encounters:  04/23/21 97.8 F (36.6 C)  03/27/21 97.8 F (36.6 C)  03/19/21 97.8 F (36.6 C) (Tympanic)   BP Readings from Last 3 Encounters:  04/23/21 (!) 160/87  03/27/21 (!) 153/82  03/19/21 (!) 174/83   Pulse Readings from Last 3 Encounters:  04/23/21 86  03/27/21 82  03/19/21 84    In general this is a well appearing caucasian female in no acute distress. She's alert and oriented x4 and appropriate throughout the examination. Cardiopulmonary assessment is negative for acute distress and she exhibits normal effort.  The left breast has a incision that is well-healed without cellulitic streaking bleeding or drainage, the same is noted at  the inferior aspect of the axilla where her sentinel lymph node biopsy was performed.    ECOG = 1  0 - Asymptomatic (Fully active, able to carry on all predisease activities without restriction)  1 - Symptomatic but completely ambulatory (Restricted in physically strenuous activity but ambulatory and able to carry out work of a light or sedentary nature. For example, light housework, office work)  2 - Symptomatic, <50% in bed during the day (Ambulatory and capable of all self care but unable to carry out any work activities. Up and about more than 50% of waking hours)  3 - Symptomatic, >50% in bed, but not bedbound (Capable of only limited self-care, confined to bed or chair 50% or more of waking hours)  4 - Bedbound (Completely  disabled. Cannot carry on any self-care. Totally confined to bed or chair)  5 - Death   Eustace Pen MM, Creech RH, Tormey DC, et al. [phone]). "Toxicity and response criteria of the [facility]". [place] Oncol. 5 (6): 649-55    LABORATORY DATA:  Lab Results  Component Value Date   WBC 7.3 03/21/2021   HGB 14.4 03/21/2021   HCT 44.6 03/21/2021   MCV 95.5 03/21/2021   PLT 233 03/21/2021   Lab Results  Component Value Date   NA 137 03/21/2021   K 4.5 03/21/2021   CL 101 03/21/2021   CO2 26 03/21/2021   Lab Results  Component Value Date   ALT 34 03/21/2021   AST 32 03/21/2021   ALKPHOS 95 03/21/2021   BILITOT 0.5 03/21/2021      RADIOGRAPHY: OCT, Retina - OU - Both Eyes  Result Date: 04/02/2021 Right Eye Quality was good. Central Foveal Thickness: 364. Progression has been stable. Findings include no IRF, no SRF, macular pucker, epiretinal membrane, abnormal foveal contour, retinal drusen (No significant change from prior). Left Eye Quality was good. Central Foveal Thickness: 483. Progression has been stable. Findings include abnormal foveal contour, no IRF, no SRF, epiretinal membrane, macular pucker (No significant change from  prior). Notes *Images captured and stored on drive Diagnosis / Impression: ERM with pucker OU (OS>OD) No significant change from prior OU Clinical management: See below Abbreviations: NFP - Normal foveal profile. CME - cystoid macular edema. PED - pigment epithelial detachment. IRF - intraretinal fluid. SRF - subretinal fluid. EZ - ellipsoid zone. ERM - epiretinal membrane. ORA - outer retinal atrophy. ORT - outer retinal tubulation. SRHM - subretinal hyper-reflective material. IRHM - intraretinal hyper-reflective material       IMPRESSION/PLAN: 1. 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 Dr. Lisbeth Renshaw discusses the final pathology findings and reviews the nature of early stage breast disease. She has done well since surgery and will not receive chemotherapy. Dr. Lisbeth Renshaw discusses the rationale for external radiotherapy to the left breast  to reduce risks of local recurrence followed by antiestrogen therapy. We discussed the risks, benefits, short, and long term effects of radiotherapy, as well as the curative intent, and the patient is interested in proceeding. Dr. Lisbeth Renshaw discusses the delivery and logistics of radiotherapy and recommends 4  weeks of radiotherapy to the left breast with deep inspiration breath hold technique. Written consent is obtained and placed in the chart, a copy was provided to the patient. She will simulate this afternoon.    In a visit lasting 45 minutes, greater than 50% of the time was spent face to face reviewing her [place], as well as in preparation of, discussing, and coordinating the patient's care.  The above documentation reflects my direct findings during this shared patient visit. Please see the separate note by Dr. Lisbeth Renshaw on this date for the remainder of the patient's plan of care.    Carola Rhine, [facility]    **Disclaimer: This note was dictated with voice recognition software. Similar sounding words can inadvertently be  transcribed and this note may contain transcription errors which may not have been corrected upon publication of note.**

## 2021-04-24 ENCOUNTER — Telehealth: Payer: Self-pay | Admitting: *Deleted

## 2021-04-24 ENCOUNTER — Encounter: Payer: Self-pay | Admitting: Physical Therapy

## 2021-04-24 ENCOUNTER — Ambulatory Visit: Payer: Medicare Other | Attending: Surgery | Admitting: Physical Therapy

## 2021-04-24 ENCOUNTER — Encounter (HOSPITAL_COMMUNITY): Payer: Self-pay

## 2021-04-24 DIAGNOSIS — Z483 Aftercare following surgery for neoplasm: Secondary | ICD-10-CM | POA: Insufficient documentation

## 2021-04-24 DIAGNOSIS — R293 Abnormal posture: Secondary | ICD-10-CM | POA: Diagnosis not present

## 2021-04-24 DIAGNOSIS — Z170 Estrogen receptor positive status [ER+]: Secondary | ICD-10-CM | POA: Diagnosis not present

## 2021-04-24 DIAGNOSIS — C50412 Malignant neoplasm of upper-outer quadrant of left female breast: Secondary | ICD-10-CM | POA: Insufficient documentation

## 2021-04-24 NOTE — Patient Instructions (Addendum)
     [facility]  [address]  [place] [address]  [phone]  After Breast Cancer Class It is recommended you attend the ABC class to be educated on lymphedema risk reduction. This class is free of charge and lasts for 1 hour. It is a 1-time class. You are scheduled for November 21st at 11:00. We will email you a link to login t othe Webex meeting.  Scar massage You can begin gentle scar massage using coconut oil or vitamin E cream to the incisions a few minutes each day.  Compression garment Your swelling may go down with wearing a tighter sports bra and using the compression I gave you today.  Home exercise Program Continue your exercises and add the one below to regain the last few degrees of range of motion and continue those during radiation.  Follow up PT: It is recommended you return every 3 months for the first 3 years following surgery to be assessed on the SOZO machine for an L-Dex score. This helps prevent clinically significant lymphedema in 95% of patients. These follow up screens are 10 minute appointments that you are not billed for. You are scheduled for December 27th at 8:20 am.  Closed Chain: Shoulder Flexion / Extension - on Wall    Hands on wall, step backward. Return. Stepping causes shoulder flexion and extension Do __5_ times, holding 5 seconds, _2__ times per day.  http://ss.exer.us/265   Copyright  VHI. All rights reserved.

## 2021-04-24 NOTE — Therapy (Signed)
[facility] @ [facility], [place], [address] Phone: [phone]   Fax:  [phone]  Physical Therapy Treatment  Patient Details  Name: Laurie Mckenzie MRN: [id number] Date of Birth: [birth year] Referring Provider (PT): Dr. Erroll Luna   Encounter Date: 04/24/2021   PT End of Session - 04/24/21 1127     Visit Number 2    Number of Visits 2    PT Start Time 1216    PT Stop Time 1140    PT Time Calculation (min) 47 min    Activity Tolerance Patient tolerated treatment well    Behavior During Therapy [facility] for tasks assessed/performed             Past Medical History:  Diagnosis Date   Headache    Hypertension    186/106 today    195/108 07/27/2017   Hypothyroidism    Primary osteoarthritis of knee    Right   +q   Past Surgical History:  Procedure Laterality Date   BREAST LUMPECTOMY WITH RADIOACTIVE SEED AND SENTINEL LYMPH NODE BIOPSY Left 03/27/2021   Procedure: LEFT BREAST LUMPECTOMY WITH RADIOACTIVE SEED AND SENTINEL LYMPH NODE BIOPSY;  Surgeon: Erroll Luna, MD;  Location: [facility];  Service: General;  Laterality: Left;   COLONOSCOPY N/A 01/16/2015   Procedure: COLONOSCOPY;  Surgeon: Daneil Dolin, MD;  [facility];  Service: Endoscopy;  Laterality: N/A;  8:30 AM   KNEE ARTHROPLASTY Left 2011   [place]   TOTAL KNEE ARTHROPLASTY Right 08/23/2017   Procedure: TOTAL KNEE ARTHROPLASTY;  Surgeon: Elsie Saas, MD;  Location: [facility];  Service: Orthopedics;  Laterality: Right;    There were no vitals filed for this visit.   Subjective Assessment - 04/24/21 1051     Subjective Patient reports she underwent a left lumpectomy and sentinel node biopsy on 03/27/2021 with 2 negative nodes removed. Her Oncotype score was 24 so she will proceed with radiation and anti-estrogen therapy.    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 She underwent a left lumpectomy and sentinel node biopsy on 03/27/2021 with 2 negative nodes removed. It is ER/PR positive and HER2 negative with a Ki67 of 15%.    Patient Stated Goals Make sure I'm back to baseline    Currently in Pain? Yes    Pain Score 5     Pain Location Heel    Pain Orientation Right    Pain Descriptors / Indicators Aching    Pain Type Chronic pain    Pain Onset More than a month ago    Pain Frequency Intermittent    Aggravating Factors  Walking    Pain Relieving Factors Resting                [facility] PT Assessment - 04/24/21 0001       Assessment   Medical Diagnosis s/p left lumpectomy and SLNB    Referring Provider (PT) Dr. Marcello Moores [place]    Onset Date/Surgical Date 03/27/21    Hand Dominance Right    Prior Therapy Baselines      Precautions   Precautions Other (comment)    Precaution Comments recent surery and left arm lymphedema risk      Restrictions   Weight Bearing Restrictions No      Balance Screen   Has the patient fallen in the past 6 months No    Has the  patient had a decrease in activity level because of a fear of falling?  No    Is the patient reluctant to leave their home because of a fear of falling?  No      Home Environment   Living Environment Private residence    Living Arrangements Spouse/significant other    Available Help at Discharge Family      Prior Function   Level of Independence Independent    Vocation Full time employment    Vocation Requirements re    Leisure Walking daily 20 minutes      Cognition   Overall Cognitive Status Within Functional Limits for tasks assessed      Observation/Other Assessments   Observations Both the left axillary and breast incisions both appear to be well healed. There is mild edema present at left lateral breast incision. No redness or signs of infection. Tattos in place for radiation.      Posture/Postural Control   Posture/Postural Control Postural  limitations    Postural Limitations Rounded Shoulders;Forward head      ROM / Strength   AROM / PROM / Strength AROM      AROM   AROM Assessment Site Shoulder    Right/Left Shoulder Left    Left Shoulder Extension 57 Degrees    Left Shoulder Flexion 144 Degrees    Left Shoulder ABduction 154 Degrees    Left Shoulder Internal Rotation 85 Degrees    Left Shoulder External Rotation 80 Degrees               LYMPHEDEMA/ONCOLOGY QUESTIONNAIRE - 04/24/21 0001       Type   Cancer Type Left breast cancer      Surgeries   Lumpectomy Date 03/27/21    Sentinel Lymph Node Biopsy Date 03/27/21    Number Lymph Nodes Removed 2      Treatment   Active Chemotherapy Treatment No    Past Chemotherapy Treatment No    Active Radiation Treatment No    Past Radiation Treatment No    Current Hormone Treatment No    Past Hormone Therapy No      What other symptoms do you have   Are you Having Heaviness or Tightness No    Are you having Pain Yes    Are you having pitting edema No    Is it Hard or Difficult finding clothes that fit No    Do you have infections No    Is there Decreased scar mobility No    Stemmer Sign No      Lymphedema Assessments   Lymphedema Assessments Upper extremities      Right Upper Extremity Lymphedema   10 cm Proximal to Olecranon Process 32.1 cm    Olecranon Process 27.4 cm    10 cm Proximal to Ulnar Styloid Process 24.7 cm    Just Proximal to Ulnar Styloid Process 16.7 cm    Across Hand at [employer] 20.7 cm    At [place] of 2nd Digit 6.7 cm      Left Upper Extremity Lymphedema   10 cm Proximal to Olecranon Process 34.7 cm    Olecranon Process 28.3 cm    10 cm Proximal to Ulnar Styloid Process 24.3 cm    Just Proximal to Ulnar Styloid Process 16.8 cm    Across Hand at [employer] 20.3 cm    At [place] of 2nd Digit 6.8 cm  L-DEX FLOWSHEETS - 04/24/21 1100       L-DEX LYMPHEDEMA SCREENING   Measurement Type Unilateral    L-DEX  MEASUREMENT EXTREMITY Upper Extremity    POSITION  Standing    DOMINANT SIDE Right    At Risk Side Left    BASELINE SCORE (UNILATERAL) 2.5   Wrong # entered at baseline              Quick Dash - 04/24/21 0001     Open a tight or new jar No difficulty    Do heavy household chores (wash walls, wash floors) No difficulty    Carry a shopping bag or briefcase No difficulty    Wash your back No difficulty    Use a knife to cut food No difficulty    Recreational activities in which you take some force or impact through your arm, shoulder, or hand (golf, hammering, tennis) No difficulty    During the past week, to what extent has your arm, shoulder or hand problem interfered with your normal social activities with family, friends, neighbors, or groups? Not at all    During the past week, to what extent has your arm, shoulder or hand problem limited your work or other regular daily activities Not at all    Arm, shoulder, or hand pain. None    Tingling (pins and needles) in your arm, shoulder, or hand None    Difficulty Sleeping No difficulty    DASH Score 0 %                             PT Education - 04/24/21 1126     Education Details HEP closed chain flexion; scar massage; aftercare    Person(s) Educated Patient    Methods Explanation;Demonstration;Handout    Comprehension Returned demonstration;Verbalized understanding                 PT Long Term Goals - 04/24/21 1131       PT LONG TERM GOAL #1   Title Patient will demonstrate she has regained full shoulder ROM and function post operatively compared to baselines.    Time 8    Period Weeks    Status Partially Met                   Plan - 04/24/21 1128     Clinical Impression Statement Patient is doing well s/p left lumpectomy and sentinel node biopsy (2 negative nodes) on 03/27/2021. She will begin radiation next week. She has nearly regained shoulder ROM and function, shows no signs of  lymphedema, and her incisions are well healed. She plans to attend the After breast Cancer class on 05/19/2021 for lymphedema education but otherwise has no PT needs at this time.    Personal Factors and Comorbidities Comorbidity 2    PT Treatment/Interventions ADLs/Self Care Home Management;Patient/family education;Therapeutic exercise    PT Next Visit Plan D/C    PT Home Exercise Plan Post op HEP and closed chain flexion    Consulted and Agree with Plan of Care Patient             Patient will benefit from skilled therapeutic intervention in order to improve the following deficits and impairments:  Postural dysfunction, Decreased range of motion, Decreased knowledge of precautions, Impaired UE functional use, Pain  Visit Diagnosis: Malignant neoplasm of upper-outer quadrant of left breast in female, estrogen receptor positive (HCC)  Abnormal posture  Aftercare following surgery for neoplasm     Problem List Patient Active Problem List   Diagnosis Date Noted   Malignant neoplasm of upper-outer quadrant of left breast in female, estrogen receptor positive ([place]) 03/17/2021   Dysuria 01/04/2020   Primary localized osteoarthritis of right knee 08/23/2017   Hypothyroidism    Hypertension    Acute maxillary sinusitis 07/09/2015   Chronic allergic rhinitis 03/18/2015   Special screening for malignant neoplasms, colon    Obesity (BMI 30-39.9) 02/20/2014   Hyperlipidemia 12/25/2013   PHYSICAL THERAPY DISCHARGE SUMMARY  Visits from Start of Care: 2  Current functional level related to goals / functional outcomes: Goals met except slightly limited with shoulder flexion   Remaining deficits: Shoulder flexion limited a few degrees.    Education / Equipment: Lymphedema education  Patient agrees to discharge. Patient goals were partially met. Patient is being discharged due to meeting the stated rehab goals.  Annia Friendly, [place] 04/24/21 11:46 AM    [facility] @ [place], [place], [address] Phone: [phone]   Fax:  [phone]  Name: Laurie Mckenzie MRN: [id number] Date of Birth: 24-May-1951

## 2021-04-24 NOTE — Telephone Encounter (Signed)
10/12 - late entry. Received oncotype results of 24. She is aware. Referral was sent for xrt.

## 2021-04-29 DIAGNOSIS — Z79890 Hormone replacement therapy (postmenopausal): Secondary | ICD-10-CM | POA: Diagnosis not present

## 2021-04-29 DIAGNOSIS — C50412 Malignant neoplasm of upper-outer quadrant of left female breast: Secondary | ICD-10-CM | POA: Insufficient documentation

## 2021-04-29 DIAGNOSIS — I10 Essential (primary) hypertension: Secondary | ICD-10-CM | POA: Insufficient documentation

## 2021-04-29 DIAGNOSIS — Z170 Estrogen receptor positive status [ER+]: Secondary | ICD-10-CM | POA: Insufficient documentation

## 2021-04-29 DIAGNOSIS — E039 Hypothyroidism, unspecified: Secondary | ICD-10-CM | POA: Insufficient documentation

## 2021-04-29 DIAGNOSIS — Z510 Encounter for antineoplastic radiation therapy: Secondary | ICD-10-CM | POA: Diagnosis not present

## 2021-04-29 DIAGNOSIS — Z79811 Long term (current) use of aromatase inhibitors: Secondary | ICD-10-CM | POA: Insufficient documentation

## 2021-04-29 DIAGNOSIS — M1990 Unspecified osteoarthritis, unspecified site: Secondary | ICD-10-CM | POA: Diagnosis not present

## 2021-04-30 ENCOUNTER — Encounter: Payer: Self-pay | Admitting: *Deleted

## 2021-05-01 ENCOUNTER — Ambulatory Visit
Admission: RE | Admit: 2021-05-01 | Discharge: 2021-05-01 | Disposition: A | Discharge status: Home / Self Care | Payer: Medicare Other | Source: Ambulatory Visit | Attending: Radiation Oncology | Admitting: Radiation Oncology

## 2021-05-01 ENCOUNTER — Other Ambulatory Visit: Payer: Self-pay

## 2021-05-01 DIAGNOSIS — Z79811 Long term (current) use of aromatase inhibitors: Secondary | ICD-10-CM | POA: Diagnosis not present

## 2021-05-01 DIAGNOSIS — I10 Essential (primary) hypertension: Secondary | ICD-10-CM | POA: Diagnosis not present

## 2021-05-01 DIAGNOSIS — C50412 Malignant neoplasm of upper-outer quadrant of left female breast: Secondary | ICD-10-CM | POA: Diagnosis not present

## 2021-05-01 DIAGNOSIS — Z510 Encounter for antineoplastic radiation therapy: Secondary | ICD-10-CM | POA: Diagnosis not present

## 2021-05-01 DIAGNOSIS — E039 Hypothyroidism, unspecified: Secondary | ICD-10-CM | POA: Diagnosis not present

## 2021-05-01 DIAGNOSIS — M1990 Unspecified osteoarthritis, unspecified site: Secondary | ICD-10-CM | POA: Diagnosis not present

## 2021-05-01 DIAGNOSIS — Z170 Estrogen receptor positive status [ER+]: Secondary | ICD-10-CM | POA: Diagnosis not present

## 2021-05-01 NOTE — Progress Notes (Signed)

## 2021-05-02 ENCOUNTER — Ambulatory Visit
Admission: RE | Admit: 2021-05-02 | Discharge: 2021-05-02 | Disposition: A | Discharge status: Home / Self Care | Payer: Medicare Other | Source: Ambulatory Visit | Attending: Radiation Oncology | Admitting: Radiation Oncology

## 2021-05-02 ENCOUNTER — Other Ambulatory Visit: Payer: Self-pay

## 2021-05-02 DIAGNOSIS — I10 Essential (primary) hypertension: Secondary | ICD-10-CM | POA: Diagnosis not present

## 2021-05-02 DIAGNOSIS — Z170 Estrogen receptor positive status [ER+]: Secondary | ICD-10-CM | POA: Diagnosis not present

## 2021-05-02 DIAGNOSIS — M1990 Unspecified osteoarthritis, unspecified site: Secondary | ICD-10-CM | POA: Diagnosis not present

## 2021-05-02 DIAGNOSIS — C50412 Malignant neoplasm of upper-outer quadrant of left female breast: Secondary | ICD-10-CM

## 2021-05-02 DIAGNOSIS — Z79811 Long term (current) use of aromatase inhibitors: Secondary | ICD-10-CM | POA: Diagnosis not present

## 2021-05-02 DIAGNOSIS — E039 Hypothyroidism, unspecified: Secondary | ICD-10-CM | POA: Diagnosis not present

## 2021-05-02 DIAGNOSIS — Z510 Encounter for antineoplastic radiation therapy: Secondary | ICD-10-CM | POA: Diagnosis not present

## 2021-05-02 MED ORDER — ALRA NON-METALLIC DEODORANT (RAD-ONC)
1.0000 "application " | Freq: Once | TOPICAL | Status: AC
  Administered 2021-05-02: 1 via TOPICAL

## 2021-05-02 MED ORDER — RADIAPLEXRX EX GEL: Freq: Once | CUTANEOUS | Status: AC

## 2021-05-05 ENCOUNTER — Other Ambulatory Visit: Payer: Self-pay

## 2021-05-05 ENCOUNTER — Ambulatory Visit
Admission: RE | Admit: 2021-05-05 | Discharge: 2021-05-05 | Disposition: A | Discharge status: Home / Self Care | Payer: Medicare Other | Source: Ambulatory Visit | Attending: Radiation Oncology | Admitting: Radiation Oncology

## 2021-05-05 DIAGNOSIS — Z79811 Long term (current) use of aromatase inhibitors: Secondary | ICD-10-CM | POA: Diagnosis not present

## 2021-05-05 DIAGNOSIS — Z510 Encounter for antineoplastic radiation therapy: Secondary | ICD-10-CM | POA: Diagnosis not present

## 2021-05-05 DIAGNOSIS — M1990 Unspecified osteoarthritis, unspecified site: Secondary | ICD-10-CM | POA: Diagnosis not present

## 2021-05-05 DIAGNOSIS — I10 Essential (primary) hypertension: Secondary | ICD-10-CM | POA: Diagnosis not present

## 2021-05-05 DIAGNOSIS — C50412 Malignant neoplasm of upper-outer quadrant of left female breast: Secondary | ICD-10-CM | POA: Diagnosis not present

## 2021-05-05 DIAGNOSIS — E039 Hypothyroidism, unspecified: Secondary | ICD-10-CM | POA: Diagnosis not present

## 2021-05-05 DIAGNOSIS — Z170 Estrogen receptor positive status [ER+]: Secondary | ICD-10-CM | POA: Diagnosis not present

## 2021-05-06 ENCOUNTER — Ambulatory Visit
Admission: RE | Admit: 2021-05-06 | Discharge: 2021-05-06 | Disposition: A | Discharge status: Home / Self Care | Payer: Medicare Other | Source: Ambulatory Visit | Attending: Radiation Oncology | Admitting: Radiation Oncology

## 2021-05-06 DIAGNOSIS — Z510 Encounter for antineoplastic radiation therapy: Secondary | ICD-10-CM | POA: Diagnosis not present

## 2021-05-06 DIAGNOSIS — M1990 Unspecified osteoarthritis, unspecified site: Secondary | ICD-10-CM | POA: Diagnosis not present

## 2021-05-06 DIAGNOSIS — E039 Hypothyroidism, unspecified: Secondary | ICD-10-CM | POA: Diagnosis not present

## 2021-05-06 DIAGNOSIS — I10 Essential (primary) hypertension: Secondary | ICD-10-CM | POA: Diagnosis not present

## 2021-05-06 DIAGNOSIS — Z79811 Long term (current) use of aromatase inhibitors: Secondary | ICD-10-CM | POA: Diagnosis not present

## 2021-05-06 DIAGNOSIS — Z170 Estrogen receptor positive status [ER+]: Secondary | ICD-10-CM | POA: Diagnosis not present

## 2021-05-06 DIAGNOSIS — C50412 Malignant neoplasm of upper-outer quadrant of left female breast: Secondary | ICD-10-CM | POA: Diagnosis not present

## 2021-05-07 ENCOUNTER — Ambulatory Visit
Admission: RE | Admit: 2021-05-07 | Discharge: 2021-05-07 | Disposition: A | Discharge status: Home / Self Care | Payer: Medicare Other | Source: Ambulatory Visit | Attending: Radiation Oncology | Admitting: Radiation Oncology

## 2021-05-07 ENCOUNTER — Other Ambulatory Visit: Payer: Self-pay

## 2021-05-07 DIAGNOSIS — Z510 Encounter for antineoplastic radiation therapy: Secondary | ICD-10-CM | POA: Diagnosis not present

## 2021-05-07 DIAGNOSIS — E039 Hypothyroidism, unspecified: Secondary | ICD-10-CM | POA: Diagnosis not present

## 2021-05-07 DIAGNOSIS — M1990 Unspecified osteoarthritis, unspecified site: Secondary | ICD-10-CM | POA: Diagnosis not present

## 2021-05-07 DIAGNOSIS — Z170 Estrogen receptor positive status [ER+]: Secondary | ICD-10-CM | POA: Diagnosis not present

## 2021-05-07 DIAGNOSIS — Z79811 Long term (current) use of aromatase inhibitors: Secondary | ICD-10-CM | POA: Diagnosis not present

## 2021-05-07 DIAGNOSIS — C50412 Malignant neoplasm of upper-outer quadrant of left female breast: Secondary | ICD-10-CM | POA: Diagnosis not present

## 2021-05-07 DIAGNOSIS — I10 Essential (primary) hypertension: Secondary | ICD-10-CM | POA: Diagnosis not present

## 2021-05-08 ENCOUNTER — Ambulatory Visit
Admission: RE | Admit: 2021-05-08 | Discharge: 2021-05-08 | Disposition: A | Discharge status: Home / Self Care | Payer: Medicare Other | Source: Ambulatory Visit | Attending: Radiation Oncology | Admitting: Radiation Oncology

## 2021-05-08 DIAGNOSIS — I10 Essential (primary) hypertension: Secondary | ICD-10-CM | POA: Diagnosis not present

## 2021-05-08 DIAGNOSIS — M1990 Unspecified osteoarthritis, unspecified site: Secondary | ICD-10-CM | POA: Diagnosis not present

## 2021-05-08 DIAGNOSIS — Z170 Estrogen receptor positive status [ER+]: Secondary | ICD-10-CM | POA: Diagnosis not present

## 2021-05-08 DIAGNOSIS — Z510 Encounter for antineoplastic radiation therapy: Secondary | ICD-10-CM | POA: Diagnosis not present

## 2021-05-08 DIAGNOSIS — Z79811 Long term (current) use of aromatase inhibitors: Secondary | ICD-10-CM | POA: Diagnosis not present

## 2021-05-08 DIAGNOSIS — C50412 Malignant neoplasm of upper-outer quadrant of left female breast: Secondary | ICD-10-CM | POA: Diagnosis not present

## 2021-05-08 DIAGNOSIS — E039 Hypothyroidism, unspecified: Secondary | ICD-10-CM | POA: Diagnosis not present

## 2021-05-09 ENCOUNTER — Ambulatory Visit
Admission: RE | Admit: 2021-05-09 | Discharge: 2021-05-09 | Disposition: A | Discharge status: Home / Self Care | Payer: Medicare Other | Source: Ambulatory Visit | Attending: Radiation Oncology | Admitting: Radiation Oncology

## 2021-05-09 ENCOUNTER — Ambulatory Visit: Payer: Medicare Other | Admitting: Radiation Oncology

## 2021-05-09 ENCOUNTER — Other Ambulatory Visit: Payer: Self-pay

## 2021-05-09 DIAGNOSIS — Z79811 Long term (current) use of aromatase inhibitors: Secondary | ICD-10-CM | POA: Diagnosis not present

## 2021-05-09 DIAGNOSIS — Z510 Encounter for antineoplastic radiation therapy: Secondary | ICD-10-CM | POA: Diagnosis not present

## 2021-05-09 DIAGNOSIS — M1990 Unspecified osteoarthritis, unspecified site: Secondary | ICD-10-CM | POA: Diagnosis not present

## 2021-05-09 DIAGNOSIS — C50412 Malignant neoplasm of upper-outer quadrant of left female breast: Secondary | ICD-10-CM | POA: Diagnosis not present

## 2021-05-09 DIAGNOSIS — E039 Hypothyroidism, unspecified: Secondary | ICD-10-CM | POA: Diagnosis not present

## 2021-05-09 DIAGNOSIS — Z170 Estrogen receptor positive status [ER+]: Secondary | ICD-10-CM | POA: Diagnosis not present

## 2021-05-09 DIAGNOSIS — I10 Essential (primary) hypertension: Secondary | ICD-10-CM | POA: Diagnosis not present

## 2021-05-12 ENCOUNTER — Other Ambulatory Visit: Payer: Self-pay

## 2021-05-12 ENCOUNTER — Ambulatory Visit
Admission: RE | Admit: 2021-05-12 | Discharge: 2021-05-12 | Disposition: A | Discharge status: Home / Self Care | Payer: Medicare Other | Source: Ambulatory Visit | Attending: Radiation Oncology | Admitting: Radiation Oncology

## 2021-05-12 ENCOUNTER — Inpatient Hospital Stay: Payer: Medicare Other | Attending: Oncology | Admitting: Oncology

## 2021-05-12 VITALS — BP 186/74 | HR 87 | Temp 98.1°F | Resp 18 | Ht 64.5 in | Wt 230.0 lb

## 2021-05-12 DIAGNOSIS — C50412 Malignant neoplasm of upper-outer quadrant of left female breast: Secondary | ICD-10-CM | POA: Diagnosis not present

## 2021-05-12 DIAGNOSIS — Z79811 Long term (current) use of aromatase inhibitors: Secondary | ICD-10-CM | POA: Diagnosis not present

## 2021-05-12 DIAGNOSIS — Z510 Encounter for antineoplastic radiation therapy: Secondary | ICD-10-CM | POA: Diagnosis not present

## 2021-05-12 DIAGNOSIS — M1990 Unspecified osteoarthritis, unspecified site: Secondary | ICD-10-CM | POA: Diagnosis not present

## 2021-05-12 DIAGNOSIS — E039 Hypothyroidism, unspecified: Secondary | ICD-10-CM | POA: Diagnosis not present

## 2021-05-12 DIAGNOSIS — I10 Essential (primary) hypertension: Secondary | ICD-10-CM | POA: Diagnosis not present

## 2021-05-12 DIAGNOSIS — Z170 Estrogen receptor positive status [ER+]: Secondary | ICD-10-CM

## 2021-05-12 MED ORDER — ANASTROZOLE 1 MG PO TABS: 1.0000 mg | ORAL_TABLET | Freq: Every day | ORAL | 4 refills | Status: DC

## 2021-05-12 NOTE — Progress Notes (Signed)
Laurie Mckenzie  [phone] [phone]    ID: Laurie Mckenzie DOB: 1951-05-20  MR#: [id number]  FHL#:[id number]  Patient Care Team: Dettinger, Laurie Kaufmann, MD as PCP - General (Family Medicine) Laurie Kaufmann, RN as Oncology Nurse Navigator Rockwell Germany, RN as Oncology Nurse Navigator Erroll Luna, MD as Consulting Physician (General Surgery) Laurie Mckenzie, Laurie Dad, MD as Consulting Physician (Oncology) Kyung Rudd, MD as Consulting Physician (Radiation Oncology) Bernarda Caffey, MD as Consulting Physician (Ophthalmology) Chauncey Cruel, MD OTHER MD:  CHIEF COMPLAINT: Estrogen receptor positive breast cancer  CURRENT TREATMENT: Adjuvant radiation   INTERVAL HISTORY: "Laurie Mckenzie" returns today for follow up of her estrogen receptor positive breast cancer. She was evaluated in the multidisciplinary breast cancer clinic on 03/19/2021.  She proceeded to left lumpectomy on 03/27/2021 under Dr. Brantley Stage. Pathology from the procedure [id number]) showed: invasive ductal carcinoma, grade 3, 0.9 cm; ductal carcinoma in situ, high grade; margins uninvolved.  Both biopsied lymph nodes were negative for carcinoma (0/2).  Oncotype DX was obtained on the final surgical sample and the recurrence score of 24 predicts a risk of recurrence outside the breast over the next 9 years of 10%, if the patient's only systemic therapy is an antiestrogen for 5 years.  It also predicts no benefit from chemotherapy.   She was referred back to Dr. Ida Rogue PA, [facility], on 04/23/2021 to review radiation therapy. She subsequently began treatment on 05/01/2021 and is scheduled to finish on 05/30/2021.   REVIEW OF SYSTEMS: [name] did well with her surgery, with no unusual pain bleeding fever or other complications.  She is also tolerating radiation generally well.  She has had 8 of 20 planned doses.  She has not noted any significant change on her scan or in her activity level.  She generally  does yard work and goes to a pool for exercise.  She does not particularly enjoy walking.  Detailed review of systems was otherwise stable.   COVID 19 VACCINATION STATUS: Moderna x3 as of November 2022   HISTORY OF CURRENT ILLNESS: From the original intake note:  [name] had routine screening mammography on 02/19/2021 showing a possible abnormality in the left breast. She underwent left diagnostic mammography with tomography and left breast ultrasonography at [facility] on 03/07/2021 showing: breast density category A; 1.1 cm irregular mass in left breast at 3 o'clock; no significant abnormalities in left axilla.  Accordingly on 03/12/2021 she proceeded to biopsy of the left breast area in question. The pathology from this procedure ([id number]) showed: invasive ductal carcinoma, grade 2. Prognostic indicators significant for: estrogen receptor, 95% positive and progesterone receptor, 95% positive, both with strong staining intensity. Proliferation marker Ki67 at 30%. HER2 equivocal by immunohistochemistry (2+), but negative by fluorescent in situ hybridization with a signals ratio  P  and number per cell   P.  Cancer Staging Malignant neoplasm of upper-outer quadrant of left breast in female, estrogen receptor positive ([name]) Staging form: Breast, AJCC 8th Edition - Clinical stage from 03/19/2021: Stage IA (cT1c, cN0, cM0, G2, ER+, PR+, HER2-) - Signed by Chauncey Cruel, MD on 03/19/2021 Stage prefix: Initial diagnosis Histologic grading system: 3 grade system Laterality: Left Staged by: Pathologist and managing physician Stage used in treatment planning: Yes National guidelines used in treatment planning: Yes Type of national guideline used in treatment planning: NCCN  The patient's subsequent history is as detailed below.   PAST MEDICAL HISTORY: Past Medical History:  Diagnosis Date   Headache  Hypertension    186/106 today    195/108 07/27/2017   Hypothyroidism    Primary  osteoarthritis of knee    Right    PAST SURGICAL HISTORY: Past Surgical History:  Procedure Laterality Date   BREAST LUMPECTOMY WITH RADIOACTIVE SEED AND SENTINEL LYMPH NODE BIOPSY Left 03/27/2021   Procedure: LEFT BREAST LUMPECTOMY WITH RADIOACTIVE SEED AND SENTINEL LYMPH NODE BIOPSY;  Surgeon: Erroll Luna, MD;  Location: [place];  Service: General;  Laterality: Left;   COLONOSCOPY N/A 01/16/2015   Procedure: COLONOSCOPY;  Surgeon: Daneil Dolin, MD;  [facility];  Service: Endoscopy;  Laterality: N/A;  8:30 AM   KNEE ARTHROPLASTY Left 2011   [place]   TOTAL KNEE ARTHROPLASTY Right 08/23/2017   Procedure: TOTAL KNEE ARTHROPLASTY;  Surgeon: Elsie Saas, MD;  Location: [place];  Service: Orthopedics;  Laterality: Right;    FAMILY HISTORY: Family History  Problem Relation Age of Onset   Hypertension Mother    Lung disease Father    Her father died at age 44 from COPD. Her mother died at age 64 from gallbladder infection. There is no family history of cancer to her knowledge.   GYNECOLOGIC HISTORY:  No LMP recorded. Patient is postmenopausal. Menarche: 70 years old [place] P 75 LMP ~70 years old Contraceptive never used HRT never used  Hysterectomy? no BSO? no   SOCIAL HISTORY: (updated 02/2021)  [name] is currently retired after working for [employer] for 44 years. She is single. She lives at home by herself, with no pets. She attends [employer].    ADVANCED DIRECTIVES: in place, [name] is her HCPOA   HEALTH MAINTENANCE: Social History   Tobacco Use   Smoking status: Never   Smokeless tobacco: Never  Vaping Use   Vaping Use: Never used  Substance Use Topics   Alcohol use: No   Drug use: No     Colonoscopy: 12/2014 (Dr. Gala Romney), recall 2026  PAP: "5 years ago?" (~2017)  Bone density: 03/26/2020, -1.1   Allergies  Allergen Reactions   Sulfa Antibiotics Other (See Comments)   Bee  Venom Swelling and Other (See Comments)    Red and hot to site   Fish Allergy Hives   Ivp Dye [Iodinated Diagnostic Agents] Other (See Comments)    MD advised not to use, due to fish allergies    Current Outpatient Medications  Medication Sig Dispense Refill   anastrozole (ARIMIDEX) 1 MG tablet Take 1 tablet (1 mg total) by mouth daily. Start June 29, 2021 90 tablet 4   acetaminophen (TYLENOL) 500 MG tablet Take 1,000-1,500 mg by mouth every 6 (six) hours as needed for moderate pain or headache.      amLODipine (NORVASC) 2.5 MG tablet Take 1 tablet (2.5 mg total) by mouth daily. 90 tablet 3   atorvastatin (LIPITOR) 20 MG tablet Take 1 tablet (20 mg total) by mouth daily. 90 tablet 3   calcium carbonate (OSCAL) 1500 (600 Ca) MG TABS tablet Take 600 mg by mouth 2 (two) times daily with a meal.     Cholecalciferol (VITAMIN D3 PO) Take 1 capsule by mouth daily.     diphenhydrAMINE (BENADRYL) 25 MG tablet Take 25 mg by mouth once.     EPINEPHrine 0.3 mg/0.3 mL IJ SOAJ injection Inject 0.3 mLs (0.3 mgttotal) into the muscleoonce for 1 dose. (Patient not taking: No sig reported) 1 each 1   HYDROcodone-acetaminophen (NORCO/VICODIN) 5-325 MG tablet Take 1  tablet by mouth every 6 (six) hours as needed for moderate pain. (Patient not taking: Reported on 04/02/2021) 15 tablet 0   ibuprofen (ADVIL) 800 MG tablet Take 1 tablet (800 mg total) by mouth every 8 (eight) hours as needed. 30 tablet 0   levothyroxine (SYNTHROID) 150 MCG tablet Take 1 tablet (150 mcg total) by mouth daily. 90 tablet 3   montelukast (SINGULAIR) 10 MG tablet Take 1 tablet (10 mg total) by mouth at bedtime. 90 tablet 3   Multiple Vitamin (MULTIVITAMIN) tablet Take 1 tablet by mouth daily.     triamcinolone (NASACORT) 55 MCG/ACT AERO nasal inhaler Place 2 sprays into the nose 2 (two) times daily.     triamcinolone cream (KENALOG) 0.1 % Apply 1 application topically 2 (two) times daily. 30 g 0   No current facility-administered  medications for this visit.    OBJECTIVE: White woman in no acute distress  Vitals:   05/12/21 1228  BP: (!) 186/74  Pulse: 87  Resp: 18  Temp: 98.1 F (36.7 C)  SpO2: 98%     Body mass index is 38.87 kg/m.   Wt Readings from Last 3 Encounters:  05/12/21 230 lb (104.3 kg)  04/23/21 229 lb 12.8 oz (104.2 kg)  03/27/21 225 lb 15.5 oz (102.5 kg)      ECOG FS:1 - Symptomatic but completely ambulatory  Sclerae unicteric, EOMs intact Wearing a mask No cervical or supraclavicular adenopathy Lungs no rales or rhonchi Heart regular rate and rhythm Abd soft, nontender, positive bowel sounds MSK no focal spinal tenderness, no upper extremity lymphedema Neuro: nonfocal, well oriented, appropriate affect Breasts: The right breast is benign.  The left breast has undergone lumpectomy and is currently receiving radiation.  The skin is holding up well, with minimal erythema no desquamation.  Both axillae are benign.   LAB RESULTS:  CMP     Component Value Date/Time   NA 137 03/21/2021 1033   NA 142 01/14/2021 0841   K 4.5 03/21/2021 1033   CL 101 03/21/2021 1033   CO2 26 03/21/2021 1033   GLUCOSE 125 (H) 03/21/2021 1033   BUN 11 03/21/2021 1033   BUN 10 01/14/2021 0841   CREATININE 0.71 03/21/2021 1033   CREATININE 0.82 03/19/2021 0835   CALCIUM 9.4 03/21/2021 1033   PROT 7.1 03/21/2021 1033   PROT 6.9 01/14/2021 0841   ALBUMIN 3.9 03/21/2021 1033   ALBUMIN 4.5 01/14/2021 0841   AST 32 03/21/2021 1033   AST 27 03/19/2021 0835   ALT 34 03/21/2021 1033   ALT 35 03/19/2021 0835   ALKPHOS 95 03/21/2021 1033   BILITOT 0.5 03/21/2021 1033   BILITOT 0.4 03/19/2021 0835   GFRNONAA >60 03/21/2021 1033   GFRNONAA >60 03/19/2021 0835   GFRAA 93 07/17/2020 0902    No results found for: TOTALPROTELP, ALBUMINELP, A1GS, A2GS, BETS, BETA2SER, GAMS, MSPIKE, SPEI  Lab Results  Component Value Date   WBC 7.3 03/21/2021   NEUTROABS 4.4 03/21/2021   HGB 14.4 03/21/2021   HCT 44.6  03/21/2021   MCV 95.5 03/21/2021   PLT 233 03/21/2021    No results found for: LABCA2  No components found for: [id number]  No results for input(s): INR in the last 168 hours.  No results found for: LABCA2  No results found for: AFB903  No results found for: YBF383  No results found for: ANV916  No results found for: CA2729  No components found for: HGQUANT  No results found for: CEA1 / No  results found for: CEA1   No results found for: AFPTUMOR  No results found for: CHROMOGRNA  No results found for: KPAFRELGTCHN, LAMBDASER, KAPLAMBRATIO (kappa/lambda light chains)  No results found for: HGBA, HGBA2QUANT, HGBFQUANT, HGBSQUAN (Hemoglobinopathy evaluation)   No results found for: LDH  No results found for: IRON, TIBC, IRONPCTSAT (Iron and TIBC)  No results found for: FERRITIN  Urinalysis    Component Value Date/Time   COLORURINE STRAW (A) 08/23/2017 0945   APPEARANCEUR Clear 01/04/2020 1159   LABSPEC 1.013 08/23/2017 0945   PHURINE 7.0 08/23/2017 0945   GLUCOSEU Negative 01/04/2020 1159   HGBUR NEGATIVE 08/23/2017 0945   BILIRUBINUR Negative 01/04/2020 1159   KETONESUR NEGATIVE 08/23/2017 0945   PROTEINUR Negative 01/04/2020 1159   PROTEINUR NEGATIVE 08/23/2017 0945   UROBILINOGEN 0.2 03/11/2007 0029   NITRITE Negative 01/04/2020 1159   NITRITE NEGATIVE 08/23/2017 0945   LEUKOCYTESUR Trace (A) 01/04/2020 1159    STUDIES: No results found.   ELIGIBLE FOR AVAILABLE RESEARCH PROTOCOL: No  ASSESSMENT: 70 y.o. [place], [place] woman status post left breast upper outer quadrant biopsy 03/12/2021 for a clinical T1c N0, stage IA invasive ductal carcinoma, grade 2, estrogen and progesterone receptor positive, HER2 not amplified (FISH results pending) with an MIB-1 of 30%  (1) status post left lumpectomy and sentinel lymph node sampling 03/27/2021 for a pT1b pN0, stage IA invasive ductal carcinoma, grade 3, with negative margins.  (a) 2 left axillary lymph nodes  were removed  (2) Oncotype score of 24 predicts a risk of recurrence outside the breast over the next 9 years of 10% if the patient's only systemic therapy is antiestrogens for 5 years.  It also predicts minimal benefit from adjuvant chemotherapy  (3) adjuvant radiation to be completed 05/15/2021  (4) anastrozole to start 05/29/2021  (a) DEXA scan 03/26/2020 showed a T score of -0.8 (normal).   PLAN: Blanco will complete a local treatment for her breast cancer later this week.  She will be ready to start antiestrogens first week in December.  We discussed the difference between tamoxifen and anastrozole in detail. She understands that anastrozole and the aromatase inhibitors in general work by blocking estrogen production. Accordingly vaginal dryness, decrease in bone density, and of course hot flashes can result. The aromatase inhibitors can also negatively affect the cholesterol profile, although that is a minor effect. One out of 5 women on aromatase inhibitors we will feel "old and achy". This arthralgia/myalgia syndrome, which resembles fibromyalgia clinically, does resolve with stopping the medications. Accordingly this is not a reason to not try an aromatase inhibitor but it is a frequent reason to stop it (in other words 20% of women will not be able to tolerate these medications).  Tamoxifen on the other hand does not block estrogen production. It does not "take away a woman's estrogen". It blocks the estrogen receptor in breast cells. Like anastrozole, it can also cause hot flashes. As opposed to anastrozole, tamoxifen has many estrogen-like effects. It is technically an estrogen receptor modulator. This means that in some tissues tamoxifen works like estrogen-- for example it helps strengthen the bones. It tends to improve the cholesterol profile. It can cause thickening of the endometrial lining, and even endometrial polyps or rarely cancer of the uterus.(The risk of uterine cancer due to  tamoxifen is one additional cancer per thousand women year). It can cause vaginal wetness or stickiness. It can cause blood clots through this estrogen-like effect--the risk of blood clots with tamoxifen is exactly the same  as with birth control pills or hormone replacement.  Neither of these agents causes mood changes or weight gain, despite the popular belief that they can have these side effects. We have data from studies comparing either of these drugs with placebo, and in those cases the control group had the same amount of weight gain and depression as the group that took the drug.  I think Teall is a good candidate for anastrozole and she will start 05/29/2021.  She will see [place] again in February and if she tolerates this medication well the plan will be to continue it for a minimum of 5 years.  Since her Oncotype score was on the high side, I would consider continuing to a total of 7 years of anastrozole assuming she tolerates this well.  She had a bone density last year which was favorable.  This could be repeated again next year  She knows to call for any other problem that may develop before her next visit  Total encounter time 25 minutes.Sarajane Jews C. Ladell Bey, MD 05/12/2021 4:10 PM Medical Oncology and Hematology [facility] [place], [place] [address] Tel. [phone]    Fax. [phone]   This document serves as a record of services personally performed by Lurline Del, MD. It was created on his behalf by Wilburn Mylar, a trained medical scribe. The creation of this record is based on the scribe's personal observations and the provider's statements to them.   I, Lurline Del MD, have reviewed the above documentation for accuracy and completeness, and I agree with the above.   *Total Encounter Time as defined by the Centers for Medicare and Medicaid Services includes, in addition to the face-to-face time of a patient visit (documented in  the note above) non-face-to-face time: obtaining and reviewing outside history, ordering and reviewing medications, tests or procedures, care coordination (communications with other health care professionals or caregivers) and documentation in the medical record.

## 2021-05-13 ENCOUNTER — Ambulatory Visit
Admission: RE | Admit: 2021-05-13 | Discharge: 2021-05-13 | Disposition: A | Discharge status: Home / Self Care | Payer: Medicare Other | Source: Ambulatory Visit | Attending: Radiation Oncology | Admitting: Radiation Oncology

## 2021-05-13 DIAGNOSIS — M1990 Unspecified osteoarthritis, unspecified site: Secondary | ICD-10-CM | POA: Diagnosis not present

## 2021-05-13 DIAGNOSIS — Z510 Encounter for antineoplastic radiation therapy: Secondary | ICD-10-CM | POA: Diagnosis not present

## 2021-05-13 DIAGNOSIS — I10 Essential (primary) hypertension: Secondary | ICD-10-CM | POA: Diagnosis not present

## 2021-05-13 DIAGNOSIS — Z79811 Long term (current) use of aromatase inhibitors: Secondary | ICD-10-CM | POA: Diagnosis not present

## 2021-05-13 DIAGNOSIS — E039 Hypothyroidism, unspecified: Secondary | ICD-10-CM | POA: Diagnosis not present

## 2021-05-13 DIAGNOSIS — Z170 Estrogen receptor positive status [ER+]: Secondary | ICD-10-CM | POA: Diagnosis not present

## 2021-05-13 DIAGNOSIS — C50412 Malignant neoplasm of upper-outer quadrant of left female breast: Secondary | ICD-10-CM | POA: Diagnosis not present

## 2021-05-14 ENCOUNTER — Other Ambulatory Visit: Payer: Self-pay

## 2021-05-14 ENCOUNTER — Ambulatory Visit
Admission: RE | Admit: 2021-05-14 | Discharge: 2021-05-14 | Disposition: A | Discharge status: Home / Self Care | Payer: Medicare Other | Source: Ambulatory Visit | Attending: Radiation Oncology | Admitting: Radiation Oncology

## 2021-05-14 DIAGNOSIS — M1990 Unspecified osteoarthritis, unspecified site: Secondary | ICD-10-CM | POA: Diagnosis not present

## 2021-05-14 DIAGNOSIS — Z510 Encounter for antineoplastic radiation therapy: Secondary | ICD-10-CM | POA: Diagnosis not present

## 2021-05-14 DIAGNOSIS — I10 Essential (primary) hypertension: Secondary | ICD-10-CM | POA: Diagnosis not present

## 2021-05-14 DIAGNOSIS — C50412 Malignant neoplasm of upper-outer quadrant of left female breast: Secondary | ICD-10-CM | POA: Diagnosis not present

## 2021-05-14 DIAGNOSIS — E039 Hypothyroidism, unspecified: Secondary | ICD-10-CM | POA: Diagnosis not present

## 2021-05-14 DIAGNOSIS — Z170 Estrogen receptor positive status [ER+]: Secondary | ICD-10-CM | POA: Diagnosis not present

## 2021-05-14 DIAGNOSIS — Z79811 Long term (current) use of aromatase inhibitors: Secondary | ICD-10-CM | POA: Diagnosis not present

## 2021-05-15 ENCOUNTER — Ambulatory Visit
Admission: RE | Admit: 2021-05-15 | Discharge: 2021-05-15 | Disposition: A | Discharge status: Home / Self Care | Payer: Medicare Other | Source: Ambulatory Visit | Attending: Radiation Oncology | Admitting: Radiation Oncology

## 2021-05-15 DIAGNOSIS — Z79811 Long term (current) use of aromatase inhibitors: Secondary | ICD-10-CM | POA: Diagnosis not present

## 2021-05-15 DIAGNOSIS — E039 Hypothyroidism, unspecified: Secondary | ICD-10-CM | POA: Diagnosis not present

## 2021-05-15 DIAGNOSIS — Z170 Estrogen receptor positive status [ER+]: Secondary | ICD-10-CM | POA: Diagnosis not present

## 2021-05-15 DIAGNOSIS — M1990 Unspecified osteoarthritis, unspecified site: Secondary | ICD-10-CM | POA: Diagnosis not present

## 2021-05-15 DIAGNOSIS — Z510 Encounter for antineoplastic radiation therapy: Secondary | ICD-10-CM | POA: Diagnosis not present

## 2021-05-15 DIAGNOSIS — C50412 Malignant neoplasm of upper-outer quadrant of left female breast: Secondary | ICD-10-CM | POA: Diagnosis not present

## 2021-05-15 DIAGNOSIS — I10 Essential (primary) hypertension: Secondary | ICD-10-CM | POA: Diagnosis not present

## 2021-05-16 ENCOUNTER — Ambulatory Visit: Payer: Medicare Other

## 2021-05-16 ENCOUNTER — Ambulatory Visit
Admission: RE | Admit: 2021-05-16 | Discharge: 2021-05-16 | Disposition: A | Discharge status: Home / Self Care | Payer: Medicare Other | Source: Ambulatory Visit | Attending: Radiation Oncology | Admitting: Radiation Oncology

## 2021-05-16 ENCOUNTER — Other Ambulatory Visit: Payer: Self-pay

## 2021-05-16 DIAGNOSIS — I10 Essential (primary) hypertension: Secondary | ICD-10-CM | POA: Diagnosis not present

## 2021-05-16 DIAGNOSIS — M1990 Unspecified osteoarthritis, unspecified site: Secondary | ICD-10-CM | POA: Diagnosis not present

## 2021-05-16 DIAGNOSIS — Z510 Encounter for antineoplastic radiation therapy: Secondary | ICD-10-CM | POA: Diagnosis not present

## 2021-05-16 DIAGNOSIS — Z170 Estrogen receptor positive status [ER+]: Secondary | ICD-10-CM | POA: Diagnosis not present

## 2021-05-16 DIAGNOSIS — E039 Hypothyroidism, unspecified: Secondary | ICD-10-CM | POA: Diagnosis not present

## 2021-05-16 DIAGNOSIS — Z79811 Long term (current) use of aromatase inhibitors: Secondary | ICD-10-CM | POA: Diagnosis not present

## 2021-05-16 DIAGNOSIS — C50412 Malignant neoplasm of upper-outer quadrant of left female breast: Secondary | ICD-10-CM | POA: Diagnosis not present

## 2021-05-19 ENCOUNTER — Ambulatory Visit
Admission: RE | Admit: 2021-05-19 | Discharge: 2021-05-19 | Disposition: A | Discharge status: Home / Self Care | Payer: Medicare Other | Source: Ambulatory Visit | Attending: Radiation Oncology | Admitting: Radiation Oncology

## 2021-05-19 ENCOUNTER — Other Ambulatory Visit: Payer: Self-pay

## 2021-05-19 DIAGNOSIS — Z79811 Long term (current) use of aromatase inhibitors: Secondary | ICD-10-CM | POA: Diagnosis not present

## 2021-05-19 DIAGNOSIS — Z170 Estrogen receptor positive status [ER+]: Secondary | ICD-10-CM | POA: Diagnosis not present

## 2021-05-19 DIAGNOSIS — C50412 Malignant neoplasm of upper-outer quadrant of left female breast: Secondary | ICD-10-CM | POA: Diagnosis not present

## 2021-05-19 DIAGNOSIS — E039 Hypothyroidism, unspecified: Secondary | ICD-10-CM | POA: Diagnosis not present

## 2021-05-19 DIAGNOSIS — I10 Essential (primary) hypertension: Secondary | ICD-10-CM | POA: Diagnosis not present

## 2021-05-19 DIAGNOSIS — Z510 Encounter for antineoplastic radiation therapy: Secondary | ICD-10-CM | POA: Diagnosis not present

## 2021-05-19 DIAGNOSIS — M1990 Unspecified osteoarthritis, unspecified site: Secondary | ICD-10-CM | POA: Diagnosis not present

## 2021-05-20 ENCOUNTER — Encounter: Payer: Self-pay | Admitting: Family Medicine

## 2021-05-20 ENCOUNTER — Ambulatory Visit (INDEPENDENT_AMBULATORY_CARE_PROVIDER_SITE_OTHER): Payer: Medicare Other | Admitting: Family Medicine

## 2021-05-20 ENCOUNTER — Ambulatory Visit: Payer: Medicare Other

## 2021-05-20 DIAGNOSIS — U071 COVID-19: Secondary | ICD-10-CM | POA: Diagnosis not present

## 2021-05-20 DIAGNOSIS — J069 Acute upper respiratory infection, unspecified: Secondary | ICD-10-CM

## 2021-05-20 NOTE — Progress Notes (Signed)
Virtual Visit via telephone Note Due to COVID-19 pandemic this visit was conducted virtually. This visit type was conducted due to national recommendations for restrictions regarding the COVID-19 Pandemic (e.g. social distancing, sheltering in place) in an effort to limit this patient's exposure and mitigate transmission in our community. All issues noted in this document were discussed and addressed.  A physical exam was not performed with this format.   I connected with Jheri A Herberger on 05/20/2021 at 1118 by telephone and verified that I am speaking with the correct person using two identifiers. Amarisa A Guizar is currently located at home and family is currently with them during visit. The provider, Monia Pouch, FNP is located in their office at time of visit.  I discussed the limitations, risks, security and privacy concerns of performing an evaluation and management service by telephone and the availability of in person appointments. I also discussed with the patient that there may be a patient responsible charge related to this service. The patient expressed understanding and agreed to proceed.  Subjective:  Patient ID: Laurie Mckenzie, female    DOB: 07/22/1950, 70 y.o.   MRN: [id number]  Chief Complaint:  URI   HPI: [name] is a 70 y.o. female presenting on 05/20/2021 for URI   URI  This is a new problem. The current episode started in the past 7 days. The problem has been waxing and waning. There has been no fever. Associated symptoms include congestion, coughing, ear pain, a plugged ear sensation, rhinorrhea and a sore throat. Pertinent negatives include no abdominal pain, chest pain, diarrhea, dysuria, headaches, joint pain, joint swelling, nausea, neck pain, rash, sinus pain, sneezing, swollen glands, vomiting or wheezing. She has tried nothing for the symptoms.    Relevant past medical, surgical, family, and social history reviewed and updated as indicated.  Allergies and  medications reviewed and updated.   Past Medical History:  Diagnosis Date   Headache    Hypertension    186/106 today    195/108 07/27/2017   Hypothyroidism    Primary osteoarthritis of knee    Right    Past Surgical History:  Procedure Laterality Date   BREAST LUMPECTOMY WITH RADIOACTIVE SEED AND SENTINEL LYMPH NODE BIOPSY Left 03/27/2021   Procedure: LEFT BREAST LUMPECTOMY WITH RADIOACTIVE SEED AND SENTINEL LYMPH NODE BIOPSY;  Surgeon: Erroll Luna, MD;  Location: [facility];  Service: General;  Laterality: Left;   COLONOSCOPY N/A 01/16/2015   Procedure: COLONOSCOPY;  Surgeon: Daneil Dolin, MD;  [facility];  Service: Endoscopy;  Laterality: N/A;  8:30 AM   KNEE ARTHROPLASTY Left 2011   [place]   TOTAL KNEE ARTHROPLASTY Right 08/23/2017   Procedure: TOTAL KNEE ARTHROPLASTY;  Surgeon: Elsie Saas, MD;  Location: [facility];  Service: Orthopedics;  Laterality: Right;    Social History   Socioeconomic History   Marital status: Single    Spouse name: Not on file   Number of children: Not on file   Years of education: Not on file   Highest education level: Not on file  Occupational History   Not on file  Tobacco Use   Smoking status: Never   Smokeless tobacco: Never  Vaping Use   Vaping Use: Never used  Substance and Sexual Activity   Alcohol use: No   Drug use: No   Sexual activity: Not Currently    Comment: not in relationship currently  Other Topics Concern   Not on file  Social History Narrative   Not on file   Social Determinants of Health   Financial Resource Strain: Low Risk    Difficulty of Paying Living Expenses: Not hard at all  Food Insecurity: No Food Insecurity   Worried About Charity fundraiser in the Last Year: Never true   Arboriculturist in the Last Year: Never true  Transportation Needs: No Transportation Needs   Lack of Transportation (Medical): No   Lack of Transportation (Non-Medical): No  Physical  Activity: Not on file  Stress: Not on file  Social Connections: Not on file  Intimate Partner Violence: Not on file    Outpatient Encounter Medications as of 05/20/2021  Medication Sig   acetaminophen (TYLENOL) 500 MG tablet Take 1,000-1,500 mg by mouth every 6 (six) hours as needed for moderate pain or headache.    amLODipine (NORVASC) 2.5 MG tablet Take 1 tablet (2.5 mg total) by mouth daily.   anastrozole (ARIMIDEX) 1 MG tablet Take 1 tablet (1 mg total) by mouth daily. Start June 29, 2021   atorvastatin (LIPITOR) 20 MG tablet Take 1 tablet (20 mg total) by mouth daily.   calcium carbonate (OSCAL) 1500 (600 Ca) MG TABS tablet Take 600 mg by mouth 2 (two) times daily with a meal.   Cholecalciferol (VITAMIN D3 PO) Take 1 capsule by mouth daily.   diphenhydrAMINE (BENADRYL) 25 MG tablet Take 25 mg by mouth once.   EPINEPHrine 0.3 mg/0.3 mL IJ SOAJ injection Inject 0.3 mLs (0.3 mgttotal) into the muscleoonce for 1 dose. (Patient not taking: No sig reported)   HYDROcodone-acetaminophen (NORCO/VICODIN) 5-325 MG tablet Take 1 tablet by mouth every 6 (six) hours as needed for moderate pain. (Patient not taking: Reported on 04/02/2021)   ibuprofen (ADVIL) 800 MG tablet Take 1 tablet (800 mg total) by mouth every 8 (eight) hours as needed.   levothyroxine (SYNTHROID) 150 MCG tablet Take 1 tablet (150 mcg total) by mouth daily.   montelukast (SINGULAIR) 10 MG tablet Take 1 tablet (10 mg total) by mouth at bedtime.   Multiple Vitamin (MULTIVITAMIN) tablet Take 1 tablet by mouth daily.   triamcinolone (NASACORT) 55 MCG/ACT AERO nasal inhaler Place 2 sprays into the nose 2 (two) times daily.   triamcinolone cream (KENALOG) 0.1 % Apply 1 application topically 2 (two) times daily.   No facility-administered encounter medications on file as of 05/20/2021.    Allergies  Allergen Reactions   Sulfa Antibiotics Other (See Comments)   Bee Venom Swelling and Other (See Comments)    Red and hot to site    Fish Allergy Hives   Ivp Dye [Iodinated Diagnostic Agents] Other (See Comments)    MD advised not to use, due to fish allergies    Review of Systems  Constitutional:  Negative for activity change, appetite change, chills, diaphoresis, fatigue, fever and unexpected weight change.  HENT:  Positive for congestion, ear pain, postnasal drip, rhinorrhea, sore throat and voice change. Negative for dental problem, drooling, ear discharge, facial swelling, hearing loss, mouth sores, sinus pressure, sinus pain, sneezing, tinnitus and trouble swallowing.   Eyes:  Negative for photophobia and visual disturbance.  Respiratory:  Positive for cough. Negative for apnea, choking, chest tightness, shortness of breath, wheezing and stridor.   Cardiovascular:  Negative for chest pain.  Gastrointestinal:  Negative for abdominal pain, diarrhea, nausea and vomiting.  Genitourinary:  Negative for decreased urine volume, difficulty urinating and dysuria.  Musculoskeletal:  Negative for joint pain and neck pain.  Skin:  Negative for rash.  Neurological:  Negative for dizziness, tremors, seizures, syncope, facial asymmetry, speech difficulty, weakness, light-headedness, numbness and headaches.  Psychiatric/Behavioral:  Negative for confusion.   All other systems reviewed and are negative.       Observations/Objective: No vital signs or physical exam, this was a telephone or virtual health encounter.  Pt alert and oriented, answers all questions appropriately, and able to speak in full sentences.    Assessment and Plan: Laurie Mckenzie was seen today for uri.  Diagnoses and all orders for this visit:  URI with cough and congestion No indications of acute bacterial infection. Will test for influenza, COVID, and RSV. Will initiate antiviral therapy if warranted. Pt aware of symptomatic care measures. Report any new, worsening, or persistent symptoms. Follow up as needed.  -     COVID-19, Flu A+B and RSV    Follow Up  Instructions: Return if symptoms worsen or fail to improve.    I discussed the assessment and treatment plan with the patient. The patient was provided an opportunity to ask questions and all were answered. The patient agreed with the plan and demonstrated an understanding of the instructions.   The patient was advised to call back or seek an in-person evaluation if the symptoms worsen or if the condition fails to improve as anticipated.  The above assessment and management plan was discussed with the patient. The patient verbalized understanding of and has agreed to the management plan. Patient is aware to call the clinic if they develop any new symptoms or if symptoms persist or worsen. Patient is aware when to return to the clinic for a follow-up visit. Patient educated on when it is appropriate to go to the emergency department.    I provided 12 minutes of non-face-to-face time during this encounter. The call started at 1118. The call ended at 1128. The other time was used for coordination of care.    Monia Pouch, FNP-C [facility] [address] [place], [place] [address] [phone] 05/20/2021

## 2021-05-21 ENCOUNTER — Ambulatory Visit: Payer: Medicare Other

## 2021-05-21 ENCOUNTER — Telehealth: Payer: Self-pay | Admitting: Family Medicine

## 2021-05-21 ENCOUNTER — Ambulatory Visit: Payer: Medicare Other | Admitting: Radiation Oncology

## 2021-05-21 LAB — COVID-19, FLU A+B AND RSV
Influenza A, NAA: NOT DETECTED
Influenza B, NAA: NOT DETECTED
RSV, NAA: NOT DETECTED
SARS-CoV-2, NAA: DETECTED — AB

## 2021-05-21 MED ORDER — MOLNUPIRAVIR EUA 200MG CAPSULE: 4.0000 | ORAL_CAPSULE | Freq: Two times a day (BID) | ORAL | 0 refills | Status: AC

## 2021-05-21 NOTE — Telephone Encounter (Signed)
Patient aware and verbalized understanding. °

## 2021-05-21 NOTE — Addendum Note (Signed)
Addended by: Baruch Gouty on: 05/21/2021 01:26 PM   Modules accepted: Orders

## 2021-05-26 ENCOUNTER — Other Ambulatory Visit: Payer: Self-pay

## 2021-05-26 ENCOUNTER — Ambulatory Visit: Payer: Medicare Other

## 2021-05-26 ENCOUNTER — Ambulatory Visit
Admission: RE | Admit: 2021-05-26 | Discharge: 2021-05-26 | Disposition: A | Discharge status: Home / Self Care | Payer: Medicare Other | Source: Ambulatory Visit | Attending: Radiation Oncology | Admitting: Radiation Oncology

## 2021-05-26 DIAGNOSIS — Z79811 Long term (current) use of aromatase inhibitors: Secondary | ICD-10-CM | POA: Diagnosis not present

## 2021-05-26 DIAGNOSIS — C50412 Malignant neoplasm of upper-outer quadrant of left female breast: Secondary | ICD-10-CM | POA: Diagnosis not present

## 2021-05-26 DIAGNOSIS — I10 Essential (primary) hypertension: Secondary | ICD-10-CM | POA: Diagnosis not present

## 2021-05-26 DIAGNOSIS — Z510 Encounter for antineoplastic radiation therapy: Secondary | ICD-10-CM | POA: Diagnosis not present

## 2021-05-26 DIAGNOSIS — Z170 Estrogen receptor positive status [ER+]: Secondary | ICD-10-CM | POA: Diagnosis not present

## 2021-05-26 DIAGNOSIS — M1990 Unspecified osteoarthritis, unspecified site: Secondary | ICD-10-CM | POA: Diagnosis not present

## 2021-05-26 DIAGNOSIS — E039 Hypothyroidism, unspecified: Secondary | ICD-10-CM | POA: Diagnosis not present

## 2021-05-27 ENCOUNTER — Ambulatory Visit: Payer: Medicare Other

## 2021-05-27 ENCOUNTER — Ambulatory Visit
Admission: RE | Admit: 2021-05-27 | Discharge: 2021-05-27 | Disposition: A | Discharge status: Home / Self Care | Payer: Medicare Other | Source: Ambulatory Visit | Attending: Radiation Oncology | Admitting: Radiation Oncology

## 2021-05-27 DIAGNOSIS — C50412 Malignant neoplasm of upper-outer quadrant of left female breast: Secondary | ICD-10-CM | POA: Diagnosis not present

## 2021-05-27 DIAGNOSIS — Z79811 Long term (current) use of aromatase inhibitors: Secondary | ICD-10-CM | POA: Diagnosis not present

## 2021-05-27 DIAGNOSIS — Z170 Estrogen receptor positive status [ER+]: Secondary | ICD-10-CM | POA: Diagnosis not present

## 2021-05-27 DIAGNOSIS — Z510 Encounter for antineoplastic radiation therapy: Secondary | ICD-10-CM | POA: Diagnosis not present

## 2021-05-27 DIAGNOSIS — M1990 Unspecified osteoarthritis, unspecified site: Secondary | ICD-10-CM | POA: Diagnosis not present

## 2021-05-27 DIAGNOSIS — E039 Hypothyroidism, unspecified: Secondary | ICD-10-CM | POA: Diagnosis not present

## 2021-05-27 DIAGNOSIS — I10 Essential (primary) hypertension: Secondary | ICD-10-CM | POA: Diagnosis not present

## 2021-05-28 ENCOUNTER — Ambulatory Visit: Payer: Medicare Other

## 2021-05-28 ENCOUNTER — Ambulatory Visit
Admission: RE | Admit: 2021-05-28 | Discharge: 2021-05-28 | Disposition: A | Discharge status: Home / Self Care | Payer: Medicare Other | Source: Ambulatory Visit | Attending: Radiation Oncology | Admitting: Radiation Oncology

## 2021-05-28 ENCOUNTER — Other Ambulatory Visit: Payer: Self-pay

## 2021-05-28 DIAGNOSIS — I10 Essential (primary) hypertension: Secondary | ICD-10-CM | POA: Diagnosis not present

## 2021-05-28 DIAGNOSIS — E039 Hypothyroidism, unspecified: Secondary | ICD-10-CM | POA: Diagnosis not present

## 2021-05-28 DIAGNOSIS — Z79811 Long term (current) use of aromatase inhibitors: Secondary | ICD-10-CM | POA: Diagnosis not present

## 2021-05-28 DIAGNOSIS — M1990 Unspecified osteoarthritis, unspecified site: Secondary | ICD-10-CM | POA: Diagnosis not present

## 2021-05-28 DIAGNOSIS — C50412 Malignant neoplasm of upper-outer quadrant of left female breast: Secondary | ICD-10-CM | POA: Diagnosis not present

## 2021-05-28 DIAGNOSIS — Z510 Encounter for antineoplastic radiation therapy: Secondary | ICD-10-CM | POA: Diagnosis not present

## 2021-05-28 DIAGNOSIS — Z170 Estrogen receptor positive status [ER+]: Secondary | ICD-10-CM | POA: Diagnosis not present

## 2021-05-29 ENCOUNTER — Ambulatory Visit: Payer: Medicare Other

## 2021-05-29 ENCOUNTER — Encounter: Payer: Self-pay | Admitting: *Deleted

## 2021-05-29 ENCOUNTER — Ambulatory Visit
Admission: RE | Admit: 2021-05-29 | Discharge: 2021-05-29 | Disposition: A | Discharge status: Home / Self Care | Payer: Medicare Other | Source: Ambulatory Visit | Attending: Radiation Oncology | Admitting: Radiation Oncology

## 2021-05-29 DIAGNOSIS — C50412 Malignant neoplasm of upper-outer quadrant of left female breast: Secondary | ICD-10-CM | POA: Diagnosis not present

## 2021-05-29 DIAGNOSIS — Z170 Estrogen receptor positive status [ER+]: Secondary | ICD-10-CM | POA: Insufficient documentation

## 2021-05-30 ENCOUNTER — Ambulatory Visit: Payer: Medicare Other

## 2021-05-30 ENCOUNTER — Other Ambulatory Visit: Payer: Self-pay

## 2021-05-30 ENCOUNTER — Ambulatory Visit
Admission: RE | Admit: 2021-05-30 | Discharge: 2021-05-30 | Disposition: A | Discharge status: Home / Self Care | Payer: Medicare Other | Source: Ambulatory Visit | Attending: Radiation Oncology | Admitting: Radiation Oncology

## 2021-05-30 DIAGNOSIS — Z170 Estrogen receptor positive status [ER+]: Secondary | ICD-10-CM | POA: Diagnosis not present

## 2021-05-30 DIAGNOSIS — C50412 Malignant neoplasm of upper-outer quadrant of left female breast: Secondary | ICD-10-CM | POA: Diagnosis not present

## 2021-06-02 ENCOUNTER — Ambulatory Visit: Payer: Medicare Other

## 2021-06-02 ENCOUNTER — Ambulatory Visit
Admission: RE | Admit: 2021-06-02 | Discharge: 2021-06-02 | Disposition: A | Discharge status: Home / Self Care | Payer: Medicare Other | Source: Ambulatory Visit | Attending: Radiation Oncology | Admitting: Radiation Oncology

## 2021-06-02 ENCOUNTER — Other Ambulatory Visit: Payer: Self-pay

## 2021-06-02 DIAGNOSIS — C50412 Malignant neoplasm of upper-outer quadrant of left female breast: Secondary | ICD-10-CM | POA: Diagnosis not present

## 2021-06-02 DIAGNOSIS — Z170 Estrogen receptor positive status [ER+]: Secondary | ICD-10-CM | POA: Diagnosis not present

## 2021-06-03 ENCOUNTER — Encounter: Payer: Self-pay | Admitting: Radiation Oncology

## 2021-06-03 ENCOUNTER — Ambulatory Visit
Admission: RE | Admit: 2021-06-03 | Discharge: 2021-06-03 | Disposition: A | Discharge status: Home / Self Care | Payer: Medicare Other | Source: Ambulatory Visit | Attending: Radiation Oncology | Admitting: Radiation Oncology

## 2021-06-03 ENCOUNTER — Ambulatory Visit: Payer: Medicare Other

## 2021-06-03 DIAGNOSIS — C50412 Malignant neoplasm of upper-outer quadrant of left female breast: Secondary | ICD-10-CM | POA: Diagnosis not present

## 2021-06-03 DIAGNOSIS — Z170 Estrogen receptor positive status [ER+]: Secondary | ICD-10-CM | POA: Diagnosis not present

## 2021-06-03 NOTE — Progress Notes (Signed)
                                                                                                                                                             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  Intent: Curative  Radiation Treatment Dates: 05/01/2021 through 06/03/2021 Site Technique Total Dose (Gy) Dose per Fx (Gy) Completed Fx Beam Energies  Breast, Left: Breast_Lt 3D 42.56/42.56 2.66 16/16 10X  Breast, Left: Breast_Lt_Bst 3D 8/8 2 4/4 6X, 10X   Narrative: The patient tolerated radiation therapy relatively well. She developed fatigue and anticipated skin changes in the treatment field.   Plan: The patient will receive a call in about one month from the radiation oncology department. She will continue follow up with Dr. Jana Hakim as well.   ________________________________________________    Carola Rhine, [facility]

## 2021-06-04 ENCOUNTER — Ambulatory Visit: Payer: Medicare Other

## 2021-06-05 ENCOUNTER — Ambulatory Visit: Payer: Medicare Other

## 2021-06-18 DIAGNOSIS — H35372 Puckering of macula, left eye: Secondary | ICD-10-CM | POA: Diagnosis not present

## 2021-06-24 ENCOUNTER — Ambulatory Visit: Payer: Medicare Other | Attending: Surgery

## 2021-06-24 ENCOUNTER — Other Ambulatory Visit: Payer: Self-pay

## 2021-06-24 VITALS — Wt 232.1 lb

## 2021-06-24 DIAGNOSIS — Z483 Aftercare following surgery for neoplasm: Secondary | ICD-10-CM | POA: Insufficient documentation

## 2021-06-24 NOTE — Therapy (Signed)
[facility] @ [address] [place], [place], [address] Phone: [phone]   Fax:  [phone]  Physical Therapy Treatment  Patient Details  Name: Dalores Weger Whiston MRN: [id number] Date of Birth: [birth year] Referring Provider (PT): Dr. Erroll Luna   Encounter Date: 06/24/2021   PT End of Session - 06/24/21 0828     Visit Number 2   # unchanged due to screen only   PT Start Time 0823    PT Stop Time 0829    PT Time Calculation (min) 6 min    Activity Tolerance Patient tolerated treatment well    Behavior During Therapy [facility] for tasks assessed/performed             Past Medical History:  Diagnosis Date   Headache    Hypertension    186/106 today    195/108 07/27/2017   Hypothyroidism    Primary osteoarthritis of knee    Right    Past Surgical History:  Procedure Laterality Date   BREAST LUMPECTOMY WITH RADIOACTIVE SEED AND SENTINEL LYMPH NODE BIOPSY Left 03/27/2021   Procedure: LEFT BREAST LUMPECTOMY WITH RADIOACTIVE SEED AND SENTINEL LYMPH NODE BIOPSY;  Surgeon: Erroll Luna, MD;  Location: [facility];  Service: General;  Laterality: Left;   COLONOSCOPY N/A 01/16/2015   Procedure: COLONOSCOPY;  Surgeon: Daneil Dolin, MD;  [facility];  Service: Endoscopy;  Laterality: N/A;  8:30 AM   KNEE ARTHROPLASTY Left 2011   [place]   TOTAL KNEE ARTHROPLASTY Right 08/23/2017   Procedure: TOTAL KNEE ARTHROPLASTY;  Surgeon: Elsie Saas, MD;  Location: [facility];  Service: Orthopedics;  Laterality: Right;    Vitals:   06/24/21 0825  Weight: 232 lb 2 oz (105.3 kg)     Subjective Assessment - 06/24/21 0825     Subjective Pt returns for her 3 month L-Dex screen.    Pertinent History Patient was diagnosed on 02/13/2021 with 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 She underwent a left lumpectomy and sentinel node biopsy on 03/27/2021 with 2 negative nodes removed. It is ER/PR positive  and HER2 negative with a Ki67 of 15%.                    L-DEX FLOWSHEETS - 06/24/21 0800       L-DEX LYMPHEDEMA SCREENING   Measurement Type Unilateral    L-DEX MEASUREMENT EXTREMITY Upper Extremity    POSITION  Standing    DOMINANT SIDE Right    At Risk Side Left    BASELINE SCORE (UNILATERAL) 2.5    L-DEX SCORE (UNILATERAL) 5.4    VALUE CHANGE (UNILAT) 2.9                                     PT Long Term Goals - 04/24/21 1131       PT LONG TERM GOAL #1   Title Patient will demonstrate she has regained full shoulder ROM and function post operatively compared to baselines.    Time 8    Period Weeks    Status Partially Met                   Plan - 06/24/21 0829     Clinical Impression Statement Pt returns for her 3 month L-Dex screen. Her change from baseline of 2.9 is WNLs so no further treatment is required  at this time except to cont every 3 month L-Dex screens which pt is agreeable to.    PT Next Visit Plan Cont every 3 month L-Dex screen for up to 2 years from her SLNB ( ~03/28/2023)    Consulted and Agree with Plan of Care Patient             Patient will benefit from skilled therapeutic intervention in order to improve the following deficits and impairments:     Visit Diagnosis: Aftercare following surgery for neoplasm     Problem List Patient Active Problem List   Diagnosis Date Noted   Malignant neoplasm of upper-outer quadrant of left breast in female, estrogen receptor positive ([place]) 03/17/2021   Dysuria 01/04/2020   Primary localized osteoarthritis of right knee 08/23/2017   Hypothyroidism    Hypertension    Acute maxillary sinusitis 07/09/2015   Chronic allergic rhinitis 03/18/2015   Special screening for malignant neoplasms, colon    Obesity (BMI 30-39.9) 02/20/2014   Hyperlipidemia 12/25/2013    Otelia Limes, PTA 06/24/2021, 8:38 AM  [facility] @ [place], [place], [address] Phone: [phone]   Fax:  [phone]  Name: Brisha Mccabe Shiraishi MRN: [id number] Date of Birth: Mar 01, 1951

## 2021-06-27 ENCOUNTER — Other Ambulatory Visit (INDEPENDENT_AMBULATORY_CARE_PROVIDER_SITE_OTHER): Payer: Medicare Other

## 2021-06-27 ENCOUNTER — Other Ambulatory Visit: Payer: Self-pay

## 2021-06-27 ENCOUNTER — Telehealth: Payer: Self-pay | Admitting: Family Medicine

## 2021-06-27 DIAGNOSIS — E559 Vitamin D deficiency, unspecified: Secondary | ICD-10-CM

## 2021-06-27 DIAGNOSIS — E039 Hypothyroidism, unspecified: Secondary | ICD-10-CM

## 2021-06-27 DIAGNOSIS — M8589 Other specified disorders of bone density and structure, multiple sites: Secondary | ICD-10-CM | POA: Diagnosis not present

## 2021-06-27 DIAGNOSIS — Z780 Asymptomatic menopausal state: Secondary | ICD-10-CM | POA: Diagnosis not present

## 2021-06-27 DIAGNOSIS — Z79899 Other long term (current) drug therapy: Secondary | ICD-10-CM

## 2021-06-27 NOTE — Telephone Encounter (Signed)
Covering PCP- please advise  

## 2021-06-27 NOTE — Telephone Encounter (Signed)
Patient coming this afternoon

## 2021-06-27 NOTE — Telephone Encounter (Signed)
I think that is reasonable.

## 2021-06-27 NOTE — Telephone Encounter (Signed)
Patient aware and would like dexa set up.  Will send to [facility] to see if she can see patient today? Please contact patient

## 2021-07-15 ENCOUNTER — Telehealth: Payer: Self-pay | Admitting: Family Medicine

## 2021-07-15 NOTE — Telephone Encounter (Signed)
Please add future lab order for patient. She has upcoming appt with [facility] on 1/23 and is coming in a few days early to do her lab work.

## 2021-07-16 ENCOUNTER — Other Ambulatory Visit: Payer: Medicare Other

## 2021-07-16 ENCOUNTER — Telehealth: Payer: Self-pay | Admitting: Adult Health

## 2021-07-16 DIAGNOSIS — E782 Mixed hyperlipidemia: Secondary | ICD-10-CM | POA: Diagnosis not present

## 2021-07-16 DIAGNOSIS — I10 Essential (primary) hypertension: Secondary | ICD-10-CM | POA: Diagnosis not present

## 2021-07-16 DIAGNOSIS — E039 Hypothyroidism, unspecified: Secondary | ICD-10-CM | POA: Diagnosis not present

## 2021-07-16 LAB — LIPID PANEL
Chol/HDL Ratio: 3.9 ratio (ref 0.0–4.4)
Cholesterol, Total: 133 mg/dL (ref 100–199)
HDL: 34 mg/dL — ABNORMAL LOW (ref >39)
LDL Chol Calc (NIH): 61 mg/dL (ref 0–99)
Triglycerides: 235 mg/dL — ABNORMAL HIGH (ref 0–149)
VLDL Cholesterol Cal: 38 mg/dL (ref 5–40)

## 2021-07-16 LAB — CBC WITH DIFFERENTIAL/PLATELET
Basophils Absolute: 0.1 10*3/uL (ref 0.0–0.2)
Basos: 1 %
EOS (ABSOLUTE): 0.3 10*3/uL (ref 0.0–0.4)
Eos: 5 %
Hematocrit: 42.6 % (ref 34.0–46.6)
Hemoglobin: 14.8 g/dL (ref 11.1–15.9)
Immature Grans (Abs): 0.1 10*3/uL (ref 0.0–0.1)
Immature Granulocytes: 1 %
Lymphocytes Absolute: 1.3 10*3/uL (ref 0.7–3.1)
Lymphs: 22 %
MCH: 31.8 pg (ref 26.6–33.0)
MCHC: 34.7 g/dL (ref 31.5–35.7)
MCV: 92 fL (ref 79–97)
Monocytes Absolute: 0.6 10*3/uL (ref 0.1–0.9)
Monocytes: 10 %
Neutrophils Absolute: 3.5 10*3/uL (ref 1.4–7.0)
Neutrophils: 61 %
Platelets: 248 10*3/uL (ref 150–450)
RBC: 4.65 x10E6/uL (ref 3.77–5.28)
RDW: 12.6 % (ref 11.7–15.4)
WBC: 5.7 10*3/uL (ref 3.4–10.8)

## 2021-07-16 LAB — CMP14+EGFR
ALT: 45 IU/L — ABNORMAL HIGH (ref 0–32)
AST: 38 IU/L (ref 0–40)
Albumin/Globulin Ratio: 1.9 (ref 1.2–2.2)
Albumin: 4.7 g/dL (ref 3.8–4.8)
Alkaline Phosphatase: 118 IU/L (ref 44–121)
BUN/Creatinine Ratio: 19 (ref 12–28)
BUN: 14 mg/dL (ref 8–27)
Bilirubin Total: 0.3 mg/dL (ref 0.0–1.2)
CO2: 26 mmol/L (ref 20–29)
Calcium: 9.9 mg/dL (ref 8.7–10.3)
Chloride: 99 mmol/L (ref 96–106)
Creatinine, Ser: 0.75 mg/dL (ref 0.57–1.00)
Globulin, Total: 2.5 g/dL (ref 1.5–4.5)
Glucose: 124 mg/dL — ABNORMAL HIGH (ref 70–99)
Potassium: 4.6 mmol/L (ref 3.5–5.2)
Sodium: 139 mmol/L (ref 134–144)
Total Protein: 7.2 g/dL (ref 6.0–8.5)
eGFR: 86 mL/min/{1.73_m2} (ref >59)

## 2021-07-16 NOTE — Telephone Encounter (Signed)
Call patient to discuss survivorship care plan visit referral.  Patient would like to proceed with appointment on July 22, 2021.  Scheduling message sent.  Wilber Bihari, NP 07/16/21 3:01 PM Medical Oncology and Hematology [facility] [place], [place] [address] Tel. [phone]    Fax. [phone]

## 2021-07-21 ENCOUNTER — Encounter: Payer: Self-pay | Admitting: Family Medicine

## 2021-07-21 ENCOUNTER — Ambulatory Visit (INDEPENDENT_AMBULATORY_CARE_PROVIDER_SITE_OTHER): Payer: Medicare Other | Admitting: Family Medicine

## 2021-07-21 VITALS — BP 160/88 | HR 100 | Ht 64.5 in | Wt 222.0 lb

## 2021-07-21 DIAGNOSIS — I10 Essential (primary) hypertension: Secondary | ICD-10-CM

## 2021-07-21 DIAGNOSIS — E782 Mixed hyperlipidemia: Secondary | ICD-10-CM

## 2021-07-21 DIAGNOSIS — E039 Hypothyroidism, unspecified: Secondary | ICD-10-CM

## 2021-07-21 MED ORDER — AMLODIPINE BESYLATE 5 MG PO TABS: 5.0000 mg | ORAL_TABLET | Freq: Every day | ORAL | 3 refills | Status: DC

## 2021-07-21 MED ORDER — ATORVASTATIN CALCIUM 20 MG PO TABS: 20.0000 mg | ORAL_TABLET | Freq: Every day | ORAL | 3 refills | Status: DC

## 2021-07-21 MED ORDER — LEVOTHYROXINE SODIUM 150 MCG PO TABS: 150.0000 ug | ORAL_TABLET | Freq: Every day | ORAL | 3 refills | Status: DC

## 2021-07-21 NOTE — Progress Notes (Signed)
SURVIVORSHIP VISIT:   BRIEF ONCOLOGIC HISTORY:  Oncology History  Malignant neoplasm of upper-outer quadrant of left breast in female, estrogen receptor positive ([place])  03/12/2021 Initial Diagnosis   status post left breast upper outer quadrant biopsy 03/12/2021 for a clinical T1c N0, stage IA invasive ductal carcinoma, grade 2, estrogen and progesterone receptor positive, HER2 not amplified (FISH results pending) with an MIB-1 of 30%   03/19/2021 Cancer Staging   Staging form: Breast, AJCC 8th Edition - Clinical stage from 03/19/2021: Stage IA (cT1c, cN0, cM0, G2, ER+, PR+, HER2-) - Signed by Chauncey Cruel, MD on 03/19/2021 Stage prefix: Initial diagnosis Histologic grading system: 3 grade system Laterality: Left Staged by: Pathologist and managing physician Stage used in treatment planning: Yes National guidelines used in treatment planning: Yes Type of national guideline used in treatment planning: NCCN    03/27/2021 Cancer Staging   Staging form: Breast, AJCC 8th Edition - Pathologic stage from 03/27/2021: Stage IA (pT1b, pN0, cM0, G3, ER+, PR+, HER2-) - Signed by Gardenia Phlegm, NP on 07/19/2021 Histologic grading system: 3 grade system    03/27/2021 Surgery   status post left lumpectomy and sentinel lymph node sampling 03/27/2021 for a pT1b pN0, stage IA invasive ductal carcinoma, grade 3, with negative margins.             (a) 2 left axillary lymph nodes were removed     03/27/2021 Oncotype testing   Oncotype score of 24 predicts a risk of recurrence outside the breast over the next 9 years of 10% if the patient's only systemic therapy is antiestrogens for 5 years.  It also predicts minimal benefit from adjuvant chemotherapy   05/01/2021 - 06/03/2021 Radiation Therapy   05/01/2021 through 06/03/2021 Site Technique Total Dose (Gy) Dose per Fx (Gy) Completed Fx Beam Energies  Breast, Left: Breast_Lt 3D 42.56/42.56 2.66 16/16 10X  Breast, Left: Breast_Lt_Bst 3D 8/8 2 4/4  6X, 10X    05/29/2021 -  Anti-estrogen oral therapy   Anastrozole daily     INTERVAL HISTORY:  Ms. Plucinski to review her survivorship care plan detailing her treatment course for breast cancer, as well as monitoring long-term side effects of that treatment, education regarding health maintenance, screening, and overall wellness and health promotion.     Overall, Ms. Gramajo reports feeling quite well.  She is working on losing weight.  She is taking anastrozole daily with good tolerance.  She experiences hot flashes with the anastrozole, however notes that she is managing these with moving the covers while in the bed.    REVIEW OF SYSTEMS:  Review of Systems  Constitutional:  Negative for appetite change, chills, fatigue, fever and unexpected weight change.  HENT:   Negative for hearing loss, lump/mass and trouble swallowing.   Eyes:  Negative for eye problems and icterus.  Respiratory:  Negative for chest tightness, cough and shortness of breath.   Cardiovascular:  Negative for chest pain, leg swelling and palpitations.  Gastrointestinal:  Negative for abdominal distention, abdominal pain, constipation, diarrhea, nausea and vomiting.  Endocrine: Positive for hot flashes.  Genitourinary:  Negative for difficulty urinating.   Musculoskeletal:  Negative for arthralgias.  Skin:  Negative for itching and rash.  Neurological:  Negative for dizziness, extremity weakness, headaches and numbness.  Hematological:  Negative for adenopathy. Does not bruise/bleed easily.  Psychiatric/Behavioral:  Negative for depression. The patient is not nervous/anxious.   Breast: Denies any new nodularity, masses, tenderness, nipple changes, or nipple discharge.  ONCOLOGY TREATMENT TEAM:  1. Surgeon:  Dr. Luisa Hart at [facility] 2. Medical Oncologist: Dr. Al Pimple  3. Radiation Oncologist: Dr. Mitzi Hansen    PAST MEDICAL/SURGICAL HISTORY:  Past Medical History:  Diagnosis Date   Headache     Hypertension    186/106 today    195/108 07/27/2017   Hypothyroidism    Primary osteoarthritis of knee    Right   Past Surgical History:  Procedure Laterality Date   BREAST LUMPECTOMY WITH RADIOACTIVE SEED AND SENTINEL LYMPH NODE BIOPSY Left 03/27/2021   Procedure: LEFT BREAST LUMPECTOMY WITH RADIOACTIVE SEED AND SENTINEL LYMPH NODE BIOPSY;  Surgeon: Harriette Bouillon, MD;  Location: [facility];  Service: General;  Laterality: Left;   COLONOSCOPY N/A 01/16/2015   Procedure: COLONOSCOPY;  Surgeon: Corbin Ade, MD;  Location: [facility];  Service: Endoscopy;  Laterality: N/A;  8:30 AM   KNEE ARTHROPLASTY Left 2011   [place]   TOTAL KNEE ARTHROPLASTY Right 08/23/2017   Procedure: TOTAL KNEE ARTHROPLASTY;  Surgeon: Salvatore Marvel, MD;  Location: [facility] OR;  Service: Orthopedics;  Laterality: Right;     ALLERGIES:  Allergies  Allergen Reactions   Sulfa Antibiotics Other (See Comments)   Bee Venom Swelling and Other (See Comments)    Red and hot to site   Fish Allergy Hives   Ivp Dye [Iodinated Contrast Media] Other (See Comments)    MD advised not to use, due to fish allergies     CURRENT MEDICATIONS:  Outpatient Encounter Medications as of 07/22/2021  Medication Sig Note   acetaminophen (TYLENOL) 500 MG tablet Take 1,000-1,500 mg by mouth every 6 (six) hours as needed for moderate pain or headache.     amLODipine (NORVASC) 5 MG tablet Take 1 tablet (5 mg total) by mouth daily.    anastrozole (ARIMIDEX) 1 MG tablet Take 1 tablet (1 mg total) by mouth daily. Start June 29, 2021    Ascorbic Acid (VITAMIN C) 100 MG tablet Take 100 mg by mouth daily.    atorvastatin (LIPITOR) 20 MG tablet Take 1 tablet (20 mg total) by mouth daily.    calcium carbonate (OSCAL) 1500 (600 Ca) MG TABS tablet Take 600 mg by mouth 2 (two) times daily with a meal.    Cholecalciferol (VITAMIN D3 PO) Take 1 capsule by mouth daily. 07/19/2020: Taking 1125 IU    diphenhydrAMINE (BENADRYL) 25 MG  tablet Take 25 mg by mouth once.    EPINEPHrine 0.3 mg/0.3 mL IJ SOAJ injection Inject 0.3 mLs (0.3 mgttotal) into the muscleoonce for 1 dose.    ibuprofen (ADVIL) 800 MG tablet Take 1 tablet (800 mg total) by mouth every 8 (eight) hours as needed.    levothyroxine (SYNTHROID) 150 MCG tablet Take 1 tablet (150 mcg total) by mouth daily.    montelukast (SINGULAIR) 10 MG tablet Take 1 tablet (10 mg total) by mouth at bedtime.    Multiple Vitamin (MULTIVITAMIN) tablet Take 1 tablet by mouth daily.    triamcinolone (NASACORT) 55 MCG/ACT AERO nasal inhaler Place 2 sprays into the nose 2 (two) times daily.    triamcinolone cream (KENALOG) 0.1 % Apply 1 application topically 2 (two) times daily.    zinc gluconate 50 MG tablet Take 50 mg by mouth daily.    No facility-administered encounter medications on file as of 07/22/2021.     ONCOLOGIC FAMILY HISTORY:  Family History  Problem Relation Age of Onset   Hypertension Mother    Lung disease Father  GENETIC COUNSELING/TESTING: Not at this time  SOCIAL HISTORY:  Social History   Socioeconomic History   Marital status: Single    Spouse name: Not on file   Number of children: Not on file   Years of education: Not on file   Highest education level: Not on file  Occupational History   Not on file  Tobacco Use   Smoking status: Never   Smokeless tobacco: Never  Vaping Use   Vaping Use: Never used  Substance and Sexual Activity   Alcohol use: No   Drug use: No   Sexual activity: Not Currently    Comment: not in relationship currently  Other Topics Concern   Not on file  Social History Narrative   Not on file   Social Determinants of Health   Financial Resource Strain: Low Risk    Difficulty of Paying Living Expenses: Not hard at all  Food Insecurity: No Food Insecurity   Worried About Charity fundraiser in the Last Year: Never true   Ran Out of Food in the Last Year: Never true  Transportation Needs: No Transportation  Needs   Lack of Transportation (Medical): No   Lack of Transportation (Non-Medical): No  Physical Activity: Insufficiently Active   Days of Exercise per Week: 2 days   Minutes of Exercise per Session: 60 min  Stress: No Stress Concern Present   Feeling of Stress : Not at all  Social Connections: Moderately Integrated   Frequency of Communication with Friends and Family: More than three times a week   Frequency of Social Gatherings with Friends and Family: More than three times a week   Attends Religious Services: More than 4 times per year   Active Member of [employer] or Organizations: Yes   Attends Music therapist: More than 4 times per year   Marital Status: Never married  Human resources officer Violence: Not At Risk   Fear of Current or Ex-Partner: No   Emotionally Abused: No   Physically Abused: No   Sexually Abused: No     OBSERVATIONS/OBJECTIVE:  BP (!) 180/76 (BP Location: Right Arm, Patient Position: Sitting)    Pulse 87    Temp 97.7 F (36.5 C) (Temporal)    Resp 18    Ht $R'5\' 4"'KK$  (1.626 m)    Wt 223 lb 1.6 oz (101.2 kg)    SpO2 94%    BMI 38.30 kg/m  GENERAL: Patient is a well appearing female in no acute distress HEENT:  Sclerae anicteric.  Oropharynx clear and moist. No ulcerations or evidence of oropharyngeal candidiasis. Neck is supple.  NODES:  No cervical, supraclavicular, or axillary lymphadenopathy palpated.  BREAST EXAM:  Deferred. LUNGS:  Clear to auscultation bilaterally.  No wheezes or rhonchi. HEART:  Regular rate and rhythm. No murmur appreciated. ABDOMEN:  Soft, nontender.  Positive, normoactive bowel sounds. No organomegaly palpated. MSK:  No focal spinal tenderness to palpation. Full range of motion bilaterally in the upper extremities. EXTREMITIES:  No peripheral edema.   SKIN:  Clear with no obvious rashes or skin changes. No nail dyscrasia. NEURO:  Nonfocal. Well oriented.  Appropriate affect.   LABORATORY DATA:  None for this  visit.  DIAGNOSTIC IMAGING:  None for this visit.      ASSESSMENT AND PLAN:  Ms.. Doster is a pleasant 71 y.o. female with Stage IA left breast invasive ductal carcinoma, ER+/PR+/HER2-, diagnosed in 02/2021, treated with lumpectomy, adjuvant radiation therapy, and anti-estrogen therapy with Anastrozole beginning in 05/2021.  She presents to the Survivorship Clinic for our initial meeting and routine follow-up post-completion of treatment for breast cancer.    1. Stage IA left breast cancer:  Ms. Stebbins is continuing to recover from definitive treatment for breast cancer. She will follow-up with her medical oncologist, Dr. Al Pimple with history and physical exam per surveillance protocol.  She will continue her anti-estrogen therapy with Anastrozole. Thus far, she is tolerating the Anastrozole well, with minimal side effects. She was instructed to make Dr. Pamelia Hoit or myself aware if she begins to experience any worsening side effects of the medication and I could see her back in clinic to help manage those side effects, as needed. Her mammogram is due 01/2022; orders placed today.    Today, a comprehensive survivorship care plan and treatment summary was reviewed with the patient today detailing her breast cancer diagnosis, treatment course, potential late/long-term effects of treatment, appropriate follow-up care with recommendations for the future, and patient education resources.  A copy of this summary, along with a letter will be sent to the patients primary care provider via mail/fax/In Basket message after todays visit.    2. Bone health:  Given Ms. Peppel's age/history of breast cancer and her current treatment regimen including anti-estrogen therapy with Anastrozole, she is at risk for bone demineralization.  Her last DEXA scan was 05/2021, which showed osteopnia with a t score of -1.9 in the left femur.  She is taking calcium and vitamin d to help with her bone strength. She was given education on  specific activities to promote bone health.  3. Cancer screening:  Due to Ms. Loudin's history and her age, she should receive screening for skin cancers, colon cancer.  The information and recommendations are listed on the patient's comprehensive care plan/treatment summary and were reviewed in detail with the patient.    4. Health maintenance and wellness promotion: Ms. Casler was encouraged to consume 5-7 servings of fruits and vegetables per day. We reviewed the "Nutrition Rainbow" handout.  She was also encouraged to engage in moderate to vigorous exercise for 30 minutes per day most days of the week. We discussed the [employer] fitness program, which is designed for cancer survivors to help them become more physically fit after cancer treatments.  She was instructed to limit her alcohol consumption and continue to abstain from tobacco use.     5. Support services/counseling: It is not uncommon for this period of the patient's cancer care trajectory to be one of many emotions and stressors.  She was given information regarding our available services and encouraged to contact me with any questions or for help enrolling in any of our support group/programs.    Follow up instructions:    -Return to cancer center in  07/2021 with Dr. Al Pimple -Mammogram due in 01/2022 -Follow up with surgery 1 year -She is welcome to return back to the Survivorship Clinic at any time; no additional follow-up needed at this time.  -Consider referral back to survivorship as a long-term survivor for continued surveillance  The patient was provided an opportunity to ask questions and all were answered. The patient agreed with the plan and demonstrated an understanding of the instructions.   Total encounter time: 50 minutes in face to face visit time, chart review, lab review, care coordination, and documentation of the encounter.   Lillard Anes, NP 07/22/21 11:16 AM Medical Oncology and Hematology [facility] [address] [place], [place] [address] Tel. [phone]  Fax. [phone]  *Total Encounter Time as defined by the Centers for Medicare and Medicaid Services includes, in addition to the face-to-face time of a patient visit (documented in the note above) non-face-to-face time: obtaining and reviewing outside history, ordering and reviewing medications, tests or procedures, care coordination (communications with other health care professionals or caregivers) and documentation in the medical record.

## 2021-07-21 NOTE — Progress Notes (Addendum)
BP (!) 160/88    Pulse 100    Ht 5' 4.5" (1.638 m)    Wt 222 lb (100.7 kg)    SpO2 97%    BMI 37.52 kg/m    Subjective:   Patient ID: Laurie Mckenzie, female    DOB: 1950-10-02, 71 y.o.   MRN: [id number]  HPI: Laurie Mckenzie is a 71 y.o. female presenting on 07/21/2021 for Medical Management of Chronic Issues, Hypothyroidism, and Hypertension   HPI Hypothyroidism recheck Patient is coming in for thyroid recheck today as well. They deny any issues with hair changes or heat or cold problems or diarrhea or constipation. They deny any chest pain or palpitations. They are currently on levothyroxine 150 micrograms   Hypertension Patient is currently on amlodipine, and their blood pressure today is 160/88. Patient denies any lightheadedness or dizziness. Patient denies headaches, blurred vision, chest pains, shortness of breath, or weakness. Denies any side effects from medication and is content with current medication.   Hyperlipidemia Patient is coming in for recheck of his hyperlipidemia. The patient is currently taking atorvastatin. They deny any issues with myalgias or history of liver damage from it. They deny any focal numbness or weakness or chest pain.   Relevant past medical, surgical, family and social history reviewed and updated as indicated. Interim medical history since our last visit reviewed. Allergies and medications reviewed and updated.  Review of Systems  Constitutional:  Negative for chills and fever.  Eyes:  Negative for visual disturbance.  Respiratory:  Negative for chest tightness and shortness of breath.   Cardiovascular:  Negative for chest pain and leg swelling.  Musculoskeletal:  Negative for back pain and gait problem.  Skin:  Negative for rash.  Neurological:  Negative for dizziness, light-headedness and headaches.  Psychiatric/Behavioral:  Negative for agitation, behavioral problems and dysphoric mood. The patient is not nervous/anxious.   All other systems  reviewed and are negative.  Per HPI unless specifically indicated above   Allergies as of 07/21/2021       Reactions   Sulfa Antibiotics Other (See Comments)   Bee Venom Swelling, Other (See Comments)   Red and hot to site   Fish Allergy Hives   Ivp Dye [iodinated Contrast Media] Other (See Comments)   MD advised not to use, due to fish allergies        Medication List        Accurate as of July 21, 2021 10:13 AM. If you have any questions, ask your nurse or doctor.          STOP taking these medications    HYDROcodone-acetaminophen 5-325 MG tablet Commonly known as: NORCO/VICODIN Stopped by: Fransisca Kaufmann Dettinger, MD       TAKE these medications    acetaminophen 500 MG tablet Commonly known as: TYLENOL Take 1,000-1,500 mg by mouth every 6 (six) hours as needed for moderate pain or headache.   amLODipine 5 MG tablet Commonly known as: NORVASC Take 1 tablet (5 mg total) by mouth daily. What changed:  medication strength how much to take Changed by: Fransisca Kaufmann Dettinger, MD   anastrozole 1 MG tablet Commonly known as: ARIMIDEX Take 1 tablet (1 mg total) by mouth daily. Start June 29, 2021   atorvastatin 20 MG tablet Commonly known as: LIPITOR Take 1 tablet (20 mg total) by mouth daily.   calcium carbonate 1500 (600 Ca) MG Tabs tablet Commonly known as: OSCAL Take 600 mg by mouth 2 (two) times  daily with a meal.   diphenhydrAMINE 25 MG tablet Commonly known as: BENADRYL Take 25 mg by mouth once.   EPINEPHrine 0.3 mg/0.3 mL Soaj injection Commonly known as: EPI-PEN Inject 0.3 mLs (0.3 mgttotal) into the muscleoonce for 1 dose.   ibuprofen 800 MG tablet Commonly known as: ADVIL Take 1 tablet (800 mg total) by mouth every 8 (eight) hours as needed.   levothyroxine 150 MCG tablet Commonly known as: SYNTHROID Take 1 tablet (150 mcg total) by mouth daily.   montelukast 10 MG tablet Commonly known as: SINGULAIR Take 1 tablet (10 mg total) by mouth  at bedtime.   multivitamin tablet Take 1 tablet by mouth daily.   triamcinolone 55 MCG/ACT Aero nasal inhaler Commonly known as: NASACORT Place 2 sprays into the nose 2 (two) times daily.   triamcinolone cream 0.1 % Commonly known as: KENALOG Apply 1 application topically 2 (two) times daily.   vitamin C 100 MG tablet Take 100 mg by mouth daily.   VITAMIN D3 PO Take 1 capsule by mouth daily.   zinc gluconate 50 MG tablet Take 50 mg by mouth daily.         Objective:   BP (!) 160/88    Pulse 100    Ht 5' 4.5" (1.638 m)    Wt 222 lb (100.7 kg)    SpO2 97%    BMI 37.52 kg/m   Wt Readings from Last 3 Encounters:  07/21/21 222 lb (100.7 kg)  06/24/21 232 lb 2 oz (105.3 kg)  05/12/21 230 lb (104.3 kg)    Physical Exam Vitals and nursing note reviewed. Exam conducted with a chaperone present.  Constitutional:      General: She is not in acute distress.    Appearance: She is well-developed. She is not diaphoretic.  Eyes:     Conjunctiva/sclera: Conjunctivae normal.  Cardiovascular:     Rate and Rhythm: Normal rate and regular rhythm.     Heart sounds: Normal heart sounds. No murmur heard. Pulmonary:     Effort: Pulmonary effort is normal. No respiratory distress.     Breath sounds: Normal breath sounds. No wheezing.  Chest:  Breasts:    Right: No inverted nipple, mass, nipple discharge or skin change.     Left: Mass present. No inverted nipple, nipple discharge or skin change.    Musculoskeletal:        General: No tenderness. Normal range of motion.  Lymphadenopathy:     Upper Body:     Right upper body: No supraclavicular, axillary or pectoral adenopathy.     Left upper body: No supraclavicular, axillary or pectoral adenopathy.  Skin:    General: Skin is warm and dry.     Findings: No rash.  Neurological:     Mental Status: She is alert and oriented to person, place, and time.     Coordination: Coordination normal.  Psychiatric:        Behavior: Behavior  normal.    Results for orders placed or performed in visit on 07/16/21  CBC with Differential/Platelet  Result Value Ref Range   WBC 5.7 3.4 - 10.8 x10E3/uL   RBC 4.65 3.77 - 5.28 x10E6/uL   Hemoglobin 14.8 11.1 - 15.9 g/dL   Hematocrit 42.6 34.0 - 46.6 %   MCV 92 79 - 97 fL   MCH 31.8 26.6 - 33.0 pg   MCHC 34.7 31.5 - 35.7 g/dL   RDW 12.6 11.7 - 15.4 %   Platelets 248 150 -  450 x10E3/uL   Neutrophils 61 Not Estab. %   Lymphs 22 Not Estab. %   Monocytes 10 Not Estab. %   Eos 5 Not Estab. %   Basos 1 Not Estab. %   Neutrophils Absolute 3.5 1.4 - 7.0 x10E3/uL   Lymphocytes Absolute 1.3 0.7 - 3.1 x10E3/uL   Monocytes Absolute 0.6 0.1 - 0.9 x10E3/uL   EOS (ABSOLUTE) 0.3 0.0 - 0.4 x10E3/uL   Basophils Absolute 0.1 0.0 - 0.2 x10E3/uL   Immature Granulocytes 1 Not Estab. %   Immature Grans (Abs) 0.1 0.0 - 0.1 x10E3/uL  CMP14+EGFR  Result Value Ref Range   Glucose 124 (H) 70 - 99 mg/dL   BUN 14 8 - 27 mg/dL   Creatinine, Ser 0.75 0.57 - 1.00 mg/dL   eGFR 86 >59 mL/min/1.73   BUN/Creatinine Ratio 19 12 - 28   Sodium 139 134 - 144 mmol/L   Potassium 4.6 3.5 - 5.2 mmol/L   Chloride 99 96 - 106 mmol/L   CO2 26 20 - 29 mmol/L   Calcium 9.9 8.7 - 10.3 mg/dL   Total Protein 7.2 6.0 - 8.5 g/dL   Albumin 4.7 3.8 - 4.8 g/dL   Globulin, Total 2.5 1.5 - 4.5 g/dL   Albumin/Globulin Ratio 1.9 1.2 - 2.2   Bilirubin Total 0.3 0.0 - 1.2 mg/dL   Alkaline Phosphatase 118 44 - 121 IU/L   AST 38 0 - 40 IU/L   ALT 45 (H) 0 - 32 IU/L  Lipid panel  Result Value Ref Range   Cholesterol, Total 133 100 - 199 mg/dL   Triglycerides 235 (H) 0 - 149 mg/dL   HDL 34 (L) >39 mg/dL   VLDL Cholesterol Cal 38 5 - 40 mg/dL   LDL Chol Calc (NIH) 61 0 - 99 mg/dL   Chol/HDL Ratio 3.9 0.0 - 4.4 ratio    Assessment & Plan:   Problem List Items Addressed This Visit       Cardiovascular and Mediastinum   Hypertension - Primary   Relevant Medications   amLODipine (NORVASC) 5 MG tablet   atorvastatin  (LIPITOR) 20 MG tablet     Endocrine   Hypothyroidism   Relevant Medications   levothyroxine (SYNTHROID) 150 MCG tablet   Other Relevant Orders   TSH     Other   Hyperlipidemia   Relevant Medications   amLODipine (NORVASC) 5 MG tablet   atorvastatin (LIPITOR) 20 MG tablet    Continue current medicine, blood work looks good except for triglycerides slightly elevated, will focus on diet, is already trying to lose weight and has made some changes.  We will add thyroid and we will check those levels but otherwise looks really good.  We will increase amlodipine to 5 mg to help with blood pressure.  No other changes at this point.   Follow up plan: Return in about 6 months (around 01/18/2022), or if symptoms worsen or fail to improve, for Hypertension and thyroid.  Counseling provided for all of the vaccine components Orders Placed This Encounter  Procedures   TSH    Caryl Pina, MD [facility] 07/21/2021, 10:13 AM

## 2021-07-22 ENCOUNTER — Inpatient Hospital Stay: Payer: Medicare Other | Attending: Adult Health | Admitting: Adult Health

## 2021-07-22 ENCOUNTER — Encounter: Payer: Self-pay | Admitting: Adult Health

## 2021-07-22 ENCOUNTER — Other Ambulatory Visit: Payer: Self-pay

## 2021-07-22 VITALS — BP 180/76 | HR 87 | Temp 97.7°F | Resp 18 | Ht 64.0 in | Wt 223.1 lb

## 2021-07-22 DIAGNOSIS — Z923 Personal history of irradiation: Secondary | ICD-10-CM | POA: Diagnosis not present

## 2021-07-22 DIAGNOSIS — Z79811 Long term (current) use of aromatase inhibitors: Secondary | ICD-10-CM | POA: Insufficient documentation

## 2021-07-22 DIAGNOSIS — Z170 Estrogen receptor positive status [ER+]: Secondary | ICD-10-CM | POA: Insufficient documentation

## 2021-07-22 DIAGNOSIS — C50412 Malignant neoplasm of upper-outer quadrant of left female breast: Secondary | ICD-10-CM | POA: Insufficient documentation

## 2021-07-22 DIAGNOSIS — Z9221 Personal history of antineoplastic chemotherapy: Secondary | ICD-10-CM | POA: Insufficient documentation

## 2021-07-23 LAB — TSH: TSH: 4.24 u[IU]/mL (ref 0.450–4.500)

## 2021-07-23 LAB — SPECIMEN STATUS REPORT

## 2021-08-11 ENCOUNTER — Telehealth: Payer: Self-pay | Admitting: Hematology and Oncology

## 2021-08-11 NOTE — Telephone Encounter (Signed)
Sch per 2/13 lab req, r/s pt, pt aware

## 2021-08-12 ENCOUNTER — Other Ambulatory Visit: Payer: Self-pay

## 2021-08-12 ENCOUNTER — Inpatient Hospital Stay: Payer: Medicare Other

## 2021-08-12 ENCOUNTER — Inpatient Hospital Stay: Payer: Medicare Other | Admitting: Hematology and Oncology

## 2021-08-12 ENCOUNTER — Encounter: Payer: Self-pay | Admitting: Hematology and Oncology

## 2021-08-12 ENCOUNTER — Inpatient Hospital Stay: Payer: Medicare Other | Attending: Oncology | Admitting: Hematology and Oncology

## 2021-08-12 DIAGNOSIS — M8580 Other specified disorders of bone density and structure, unspecified site: Secondary | ICD-10-CM | POA: Diagnosis not present

## 2021-08-12 DIAGNOSIS — C50412 Malignant neoplasm of upper-outer quadrant of left female breast: Secondary | ICD-10-CM | POA: Diagnosis not present

## 2021-08-12 DIAGNOSIS — Z79811 Long term (current) use of aromatase inhibitors: Secondary | ICD-10-CM | POA: Insufficient documentation

## 2021-08-12 DIAGNOSIS — Z923 Personal history of irradiation: Secondary | ICD-10-CM | POA: Diagnosis not present

## 2021-08-12 DIAGNOSIS — E039 Hypothyroidism, unspecified: Secondary | ICD-10-CM | POA: Diagnosis not present

## 2021-08-12 DIAGNOSIS — I10 Essential (primary) hypertension: Secondary | ICD-10-CM | POA: Diagnosis not present

## 2021-08-12 DIAGNOSIS — Z79899 Other long term (current) drug therapy: Secondary | ICD-10-CM | POA: Diagnosis not present

## 2021-08-12 DIAGNOSIS — Z170 Estrogen receptor positive status [ER+]: Secondary | ICD-10-CM | POA: Insufficient documentation

## 2021-08-12 DIAGNOSIS — R232 Flushing: Secondary | ICD-10-CM | POA: Diagnosis not present

## 2021-08-12 NOTE — Progress Notes (Signed)
SURVIVORSHIP VISIT:   BRIEF ONCOLOGIC HISTORY:  Oncology History  Malignant neoplasm of upper-outer quadrant of left breast in female, estrogen receptor positive ([place])  03/12/2021 Initial Diagnosis   status post left breast upper outer quadrant biopsy 03/12/2021 for a clinical T1c N0, stage IA invasive ductal carcinoma, grade 2, estrogen and progesterone receptor positive, HER2 not amplified (FISH results pending) with an MIB-1 of 30%   03/19/2021 Cancer Staging   Staging form: Breast, AJCC 8th Edition - Clinical stage from 03/19/2021: Stage IA (cT1c, cN0, cM0, G2, ER+, PR+, HER2-) - Signed by Chauncey Cruel, MD on 03/19/2021 Stage prefix: Initial diagnosis Histologic grading system: 3 grade system Laterality: Left Staged by: Pathologist and managing physician Stage used in treatment planning: Yes National guidelines used in treatment planning: Yes Type of national guideline used in treatment planning: NCCN   03/27/2021 Cancer Staging   Staging form: Breast, AJCC 8th Edition - Pathologic stage from 03/27/2021: Stage IA (pT1b, pN0, cM0, G3, ER+, PR+, HER2-) - Signed by Gardenia Phlegm, NP on 07/19/2021 Histologic grading system: 3 grade system   03/27/2021 Surgery   status post left lumpectomy and sentinel lymph node sampling 03/27/2021 for a pT1b pN0, stage IA invasive ductal carcinoma, grade 3, with negative margins.             (a) 2 left axillary lymph nodes were removed     03/27/2021 Oncotype testing   Oncotype score of 24 predicts a risk of recurrence outside the breast over the next 9 years of 10% if the patient's only systemic therapy is antiestrogens for 5 years.  It also predicts minimal benefit from adjuvant chemotherapy   05/01/2021 - 06/03/2021 Radiation Therapy   05/01/2021 through 06/03/2021 Site Technique Total Dose (Gy) Dose per Fx (Gy) Completed Fx Beam Energies  Breast, Left: Breast_Lt 3D 42.56/42.56 2.66 16/16 10X  Breast, Left: Breast_Lt_Bst 3D 8/8 2 4/4 6X,  10X     05/29/2021 -  Anti-estrogen oral therapy   Anastrozole daily     INTERVAL HISTORY:   She is taking anastrozole daily with good tolerance.  She experiences hot flashes with the anastrozole intermittently. No other major adverse effects. She denies any changes in her breast Rest of the pertinent 10 point ROS reviewed and negative.  REVIEW OF SYSTEMS:  Review of Systems  Constitutional:  Negative for appetite change, chills, fatigue, fever and unexpected weight change.  HENT:   Negative for hearing loss, lump/mass and trouble swallowing.   Eyes:  Negative for eye problems and icterus.  Respiratory:  Negative for chest tightness, cough and shortness of breath.   Cardiovascular:  Negative for chest pain, leg swelling and palpitations.  Gastrointestinal:  Negative for abdominal distention, abdominal pain, constipation, diarrhea, nausea and vomiting.  Endocrine: Positive for hot flashes.  Genitourinary:  Negative for difficulty urinating.   Musculoskeletal:  Negative for arthralgias.  Skin:  Negative for itching and rash.  Neurological:  Negative for dizziness, extremity weakness, headaches and numbness.  Hematological:  Negative for adenopathy. Does not bruise/bleed easily.  Psychiatric/Behavioral:  Negative for depression. The patient is not nervous/anxious.   Breast: Denies any new nodularity, masses, tenderness, nipple changes, or nipple discharge.   ONCOLOGY TREATMENT TEAM:  1. Surgeon:  Dr. Brantley Stage at [facility] 2. Medical Oncologist: Dr. Chryl Heck  3. Radiation Oncologist: Dr. Lisbeth Renshaw    PAST MEDICAL/SURGICAL HISTORY:  Past Medical History:  Diagnosis Date   Headache    Hypertension    186/106 today  195/108 07/27/2017   Hypothyroidism    Primary osteoarthritis of knee    Right   Past Surgical History:  Procedure Laterality Date   BREAST LUMPECTOMY WITH RADIOACTIVE SEED AND SENTINEL LYMPH NODE BIOPSY Left 03/27/2021   Procedure: LEFT BREAST LUMPECTOMY  WITH RADIOACTIVE SEED AND SENTINEL LYMPH NODE BIOPSY;  Surgeon: Erroll Luna, MD;  Location: [place];  Service: General;  Laterality: Left;   COLONOSCOPY N/A 01/16/2015   Procedure: COLONOSCOPY;  Surgeon: Daneil Dolin, MD;  [facility];  Service: Endoscopy;  Laterality: N/A;  8:30 AM   KNEE ARTHROPLASTY Left 2011   [place]   TOTAL KNEE ARTHROPLASTY Right 08/23/2017   Procedure: TOTAL KNEE ARTHROPLASTY;  Surgeon: Elsie Saas, MD;  Location: [place];  Service: Orthopedics;  Laterality: Right;     ALLERGIES:  Allergies  Allergen Reactions   Sulfa Antibiotics Other (See Comments)   Bee Venom Swelling and Other (See Comments)    Red and hot to site   Fish Allergy Hives   Ivp Dye [Iodinated Contrast Media] Other (See Comments)    MD advised not to use, due to fish allergies     CURRENT MEDICATIONS:  Outpatient Encounter Medications as of 08/12/2021  Medication Sig Note   acetaminophen (TYLENOL) 500 MG tablet Take 1,000-1,500 mg by mouth every 6 (six) hours as needed for moderate pain or headache.     amLODipine (NORVASC) 5 MG tablet Take 1 tablet (5 mg total) by mouth daily.    anastrozole (ARIMIDEX) 1 MG tablet Take 1 tablet (1 mg total) by mouth daily. Start June 29, 2021    Ascorbic Acid (VITAMIN C) 100 MG tablet Take 100 mg by mouth daily.    atorvastatin (LIPITOR) 20 MG tablet Take 1 tablet (20 mg total) by mouth daily.    calcium carbonate (OSCAL) 1500 (600 Ca) MG TABS tablet Take 600 mg by mouth 2 (two) times daily with a meal.    Cholecalciferol (VITAMIN D3 PO) Take 1 capsule by mouth daily. 07/19/2020: Taking 1125 IU    diphenhydrAMINE (BENADRYL) 25 MG tablet Take 25 mg by mouth once.    EPINEPHrine 0.3 mg/0.3 mL IJ SOAJ injection Inject 0.3 mLs (0.3 mgttotal) into the muscleoonce for 1 dose.    ibuprofen (ADVIL) 800 MG tablet Take 1 tablet (800 mg total) by mouth every 8 (eight) hours as needed.    levothyroxine (SYNTHROID) 150 MCG tablet  Take 1 tablet (150 mcg total) by mouth daily.    montelukast (SINGULAIR) 10 MG tablet Take 1 tablet (10 mg total) by mouth at bedtime.    Multiple Vitamin (MULTIVITAMIN) tablet Take 1 tablet by mouth daily.    triamcinolone (NASACORT) 55 MCG/ACT AERO nasal inhaler Place 2 sprays into the nose 2 (two) times daily.    triamcinolone cream (KENALOG) 0.1 % Apply 1 application topically 2 (two) times daily.    zinc gluconate 50 MG tablet Take 50 mg by mouth daily.    No facility-administered encounter medications on file as of 08/12/2021.     ONCOLOGIC FAMILY HISTORY:  Family History  Problem Relation Age of Onset   Hypertension Mother    Lung disease Father      GENETIC COUNSELING/TESTING: Not at this time  SOCIAL HISTORY:  Social History   Socioeconomic History   Marital status: Single    Spouse name: Not on file   Number of children: Not on file   Years of education: Not on file   Highest education  level: Not on file  Occupational History   Not on file  Tobacco Use   Smoking status: Never   Smokeless tobacco: Never  Vaping Use   Vaping Use: Never used  Substance and Sexual Activity   Alcohol use: No   Drug use: No   Sexual activity: Not Currently    Comment: not in relationship currently  Other Topics Concern   Not on file  Social History Narrative   Not on file   Social Determinants of Health   Financial Resource Strain: Low Risk    Difficulty of Paying Living Expenses: Not hard at all  Food Insecurity: No Food Insecurity   Worried About Charity fundraiser in the Last Year: Never true   [place] in the Last Year: Never true  Transportation Needs: No Transportation Needs   Lack of Transportation (Medical): No   Lack of Transportation (Non-Medical): No  Physical Activity: Insufficiently Active   Days of Exercise per Week: 2 days   Minutes of Exercise per Session: 60 min  Stress: No Stress Concern Present   Feeling of Stress : Not at all  Social  Connections: Moderately Integrated   Frequency of Communication with Friends and Family: More than three times a week   Frequency of Social Gatherings with Friends and Family: More than three times a week   Attends Religious Services: More than 4 times per year   Active Member of [employer] or Organizations: Yes   Attends Music therapist: More than 4 times per year   Marital Status: Never married  Human resources officer Violence: Not At Risk   Fear of Current or Ex-Partner: No   Emotionally Abused: No   Physically Abused: No   Sexually Abused: No     OBSERVATIONS/OBJECTIVE:  There were no vitals taken for this visit. GENERAL: Patient is a well appearing female in no acute distress HEENT:  Sclerae anicteric.  Oropharynx clear and moist. No ulcerations or evidence of oropharyngeal candidiasis. Neck is supple.  NODES:  No cervical, supraclavicular, or axillary lymphadenopathy palpated.  BREAST EXAM:  Bilateral breasts inspected, no palpable masses or regional adenopathy. LUNGS:  Clear to auscultation bilaterally.  No wheezes or rhonchi. HEART:  Regular rate and rhythm. No murmur appreciated. ABDOMEN:  Soft, nontender.  Positive, normoactive bowel sounds. No organomegaly palpated. MSK:  No focal spinal tenderness to palpation. Full range of motion bilaterally in the upper extremities. EXTREMITIES:  No peripheral edema.   SKIN:  Clear with no obvious rashes or skin changes. No nail dyscrasia. NEURO:  Nonfocal. Well oriented.  Appropriate affect.   LABORATORY DATA:  None for this visit.  DIAGNOSTIC IMAGING:  None for this visit.      ASSESSMENT AND PLAN:  Ms.. Feigenbaum is a pleasant 71 y.o. female with Stage IA left breast invasive ductal carcinoma, ER+/PR+/HER2-, diagnosed in 02/2021, treated with lumpectomy, adjuvant radiation therapy, and anti-estrogen therapy with Anastrozole beginning in 05/2021.  [facility] for our initial meeting and routine  follow-up post-completion of treatment for breast cancer.    1. Stage IA left breast cancer:  She is doing well on adjuvant Arimidex. No major adverse effects besides intermittent hot flashes.  There appears to be no concern for recurrence on review of systems or physical examination.  Anticipate continuation of Arimidex until January 2028.  With regards to her bone health, she has been taking calcium and vitamin D to help her bone strength since she has osteopenia  diagnosed in December 2022.  We could consider bisphosphonates for management of osteopenia especially in the setting of concomitant Arimidex if she can have a dental exam and clearance from her dentist.  We can consider this in the future.  She was instructed to continue self breast exam and keep [place] posted of any changes in her breast.  She will return to clinic in 6 months.   Follow up instructions:    -Return to cancer center in 6 months -Mammogram due in 01/2022, this has been ordered by Mendel Ryder -Follow up with surgery 1 year   Total encounter time: 32minutes in face to face visit time, chart review, lab review, care coordination, and documentation of the encounter.    *Total Encounter Time as defined by the Centers for Medicare and Medicaid Services includes, in addition to the face-to-face time of a patient visit (documented in the note above) non-face-to-face time: obtaining and reviewing outside history, ordering and reviewing medications, tests or procedures, care coordination (communications with other health care professionals or caregivers) and documentation in the medical record.

## 2021-08-18 ENCOUNTER — Other Ambulatory Visit: Payer: Self-pay | Admitting: Family Medicine

## 2021-08-18 DIAGNOSIS — C50412 Malignant neoplasm of upper-outer quadrant of left female breast: Secondary | ICD-10-CM | POA: Diagnosis not present

## 2021-08-18 DIAGNOSIS — Z170 Estrogen receptor positive status [ER+]: Secondary | ICD-10-CM | POA: Diagnosis not present

## 2021-08-19 NOTE — Progress Notes (Signed)
[facility] Note  08/28/2021     CHIEF COMPLAINT Patient presents for Retina Follow Up    HISTORY OF PRESENT ILLNESS: Laurie Mckenzie is a 71 y.o. female who presents to the clinic today for:   HPI     Retina Follow Up   Patient presents with  Other.  In both eyes.  Severity is moderate.  Duration of 5 months.  Since onset it is stable.  I, the attending physician,  performed the HPI with the patient and updated documentation appropriately.        Comments   Pt here for 5 mo ret f/u for ERM OU. Pt states no VA change since previous visit. Pt reports breast cancer diagnosis last year, completed radiation treatment in Dec 2022. She is now on an oral treatment, Anastrozole 1 mg QD. Pt had AEE at the end of Dec w/ Dr. Rosana Hoes in [place] who told pt that she'd need to come back after her f/u with [place].       Last edited by Bernarda Caffey, MD on 08/29/2021  1:18 PM.     Pt states no change in vision, pt saw Dr. Rosana Hoes in December, she did not give her a new glasses rx, but wanted to see what Dr. Coralyn Pear thought about her cataracts, pt was dx with breast cancer in October 2022 and had sx, she is now on Anastrozole once a day for 5 years  Referring physician: Leticia Clas, [address]  [place], [place] [address]  HISTORICAL INFORMATION:   Selected notes from the MEDICAL RECORD NUMBER Referred by Dr. Rosana Hoes LEE: 06.28.22 Ocular Hx- ERM OU, VMT OS, Cataracts OU. VA OD 20/20, OS 20/30    CURRENT MEDICATIONS: No current outpatient medications on file. (Ophthalmic Drugs)   No current facility-administered medications for this visit. (Ophthalmic Drugs)   Current Outpatient Medications (Other)  Medication Sig   acetaminophen (TYLENOL) 500 MG tablet Take 1,000-1,500 mg by mouth every 6 (six) hours as needed for moderate pain or headache.    amLODipine (NORVASC) 5 MG tablet Take 1 tablet (5 mg total) by mouth daily.   anastrozole (ARIMIDEX) 1 MG tablet Take  1 tablet (1 mg total) by mouth daily. Start June 29, 2021   Ascorbic Acid (VITAMIN C) 100 MG tablet Take 100 mg by mouth daily.   atorvastatin (LIPITOR) 20 MG tablet Take 1 tablet (20 mg total) by mouth daily.   calcium carbonate (OSCAL) 1500 (600 Ca) MG TABS tablet Take 600 mg by mouth 2 (two) times daily with a meal.   Cholecalciferol (VITAMIN D3 PO) Take 1 capsule by mouth daily.   diphenhydrAMINE (BENADRYL) 25 MG tablet Take 25 mg by mouth once.   EPINEPHrine 0.3 mg/0.3 mL IJ SOAJ injection Inject 0.3 mLs (0.3 mgttotal) into the muscle for 1 dose.   ibuprofen (ADVIL) 800 MG tablet Take 1 tablet (800 mg total) by mouth every 8 (eight) hours as needed.   levothyroxine (SYNTHROID) 150 MCG tablet Take 1 tablet (150 mcg total) by mouth daily.   montelukast (SINGULAIR) 10 MG tablet Take 1 tablet (10 mg total) by mouth at bedtime.   Multiple Vitamin (MULTIVITAMIN) tablet Take 1 tablet by mouth daily.   triamcinolone (NASACORT) 55 MCG/ACT AERO nasal inhaler Place 2 sprays into the nose 2 (two) times daily.   triamcinolone cream (KENALOG) 0.1 % Apply 1 application topically 2 (two) times daily.   zinc gluconate 50 MG tablet  Take 50 mg by mouth daily.   No current facility-administered medications for this visit. (Other)   REVIEW OF SYSTEMS: ROS   Positive for: Musculoskeletal, Endocrine, Cardiovascular, Eyes Negative for: Constitutional, Gastrointestinal, Neurological, Skin, Genitourinary, HENT, Respiratory, Psychiatric, Allergic/Imm, Heme/Lymph Last edited by Kingsley Spittle, COT on 08/28/2021  8:47 AM.     ALLERGIES Allergies  Allergen Reactions   Sulfa Antibiotics Other (See Comments)   Bee Venom Swelling and Other (See Comments)    Red and hot to site   Fish Allergy Hives   Ivp Dye [Iodinated Contrast Media] Other (See Comments)    MD advised not to use, due to fish allergies   PAST MEDICAL HISTORY Past Medical History:  Diagnosis Date   Cancer [facility])    Headache     Hypertension    186/106 today    195/108 07/27/2017   Hypothyroidism    Primary osteoarthritis of knee    Right   Past Surgical History:  Procedure Laterality Date   BREAST LUMPECTOMY WITH RADIOACTIVE SEED AND SENTINEL LYMPH NODE BIOPSY Left 03/27/2021   Procedure: LEFT BREAST LUMPECTOMY WITH RADIOACTIVE SEED AND SENTINEL LYMPH NODE BIOPSY;  Surgeon: Erroll Luna, MD;  Location: [place];  Service: General;  Laterality: Left;   COLONOSCOPY N/A 01/16/2015   Procedure: COLONOSCOPY;  Surgeon: Daneil Dolin, MD;  [facility];  Service: Endoscopy;  Laterality: N/A;  8:30 AM   KNEE ARTHROPLASTY Left 2011   [place]   TOTAL KNEE ARTHROPLASTY Right 08/23/2017   Procedure: TOTAL KNEE ARTHROPLASTY;  Surgeon: Elsie Saas, MD;  Location: [place];  Service: Orthopedics;  Laterality: Right;   FAMILY HISTORY Family History  Problem Relation Age of Onset   Hypertension Mother    Lung disease Father    SOCIAL HISTORY Social History   Tobacco Use   Smoking status: Never   Smokeless tobacco: Never  Vaping Use   Vaping Use: Never used  Substance Use Topics   Alcohol use: No   Drug use: No       OPHTHALMIC EXAM: Base Eye Exam     Visual Acuity (Snellen - Linear)       Right Left   Dist cc 20/20 -2 20/40 -1   Dist ph cc  NI    Correction: Glasses         Tonometry (Tonopen, 8:55 AM)       Right Left   Pressure 20 15         Pupils       Dark Light Shape React APD   Right 4 3 Round Brisk None   Left 4 3 Round Brisk None         Visual Fields (Counting fingers)       Left Right    Full Full         Extraocular Movement       Right Left    Full, Ortho Full, Ortho         Neuro/Psych     Oriented x3: Yes   Mood/Affect: Normal         Dilation     Both eyes: 1.0% Mydriacyl, 2.5% Phenylephrine @ 8:56 AM           Slit Lamp and Fundus Exam     Slit Lamp Exam       Right Left   Lids/Lashes Dermatochalasis -  upper lid, Meibomian gland dysfunction Dermatochalasis - upper lid   Conjunctiva/Sclera White and  quiet White and quiet   Cornea trace PEE trace PEE   Anterior Chamber deep, clear, narrow angles deep, clear, narrow angles   Iris Round and dilated Round and dilated   Lens 2-3+ Nuclear sclerosis with brunescence, 2-3+ Cortical cataract 2-3+ Nuclear sclerosis with brunescence, 2-3+ Cortical cataract   Anterior Vitreous Vitreous syneresis, Posterior vitreous detachment Vitreous syneresis, Posterior vitreous detachment, vitreous condensations         Fundus Exam       Right Left   Disc Pink and Sharp, mild PPA/PPP Pink and Sharp, temporal PPA   C/D Ratio 0.3 0.3   Macula Flat, Blunted foveal reflex, ERM with early striae, no heme or edema Flat, Blunted foveal reflex, ERM with striae and central thickening, no heme   Vessels attenuated, Tortuous attenuated, Tortuous   Periphery Attached; no heme; no RT/RD Attached; no heme             Refraction     Wearing Rx       Sphere Cylinder Axis Add   Right +2.25 +1.00 153 +2.00   Left +1.75 +0.50 178 +2.00            IMAGING AND PROCEDURES  Imaging and Procedures for 08/28/2021  OCT, Retina - OU - Both Eyes       Right Eye Quality was good. Central Foveal Thickness: 361. Progression has been stable. Findings include no IRF, no SRF, macular pucker, epiretinal membrane, abnormal foveal contour, retinal drusen (No significant change from prior).   Left Eye Quality was good. Central Foveal Thickness: 480. Progression has been stable. Findings include abnormal foveal contour, no IRF, no SRF, epiretinal membrane, macular pucker (No significant change from prior).   Notes *Images captured and stored on drive  Diagnosis / Impression:  ERM with pucker OU (OS>OD) No significant change from prior OU  Clinical management:  See below  Abbreviations: NFP - Normal foveal profile. CME - cystoid macular edema. PED - pigment epithelial  detachment. IRF - intraretinal fluid. SRF - subretinal fluid. EZ - ellipsoid zone. ERM - epiretinal membrane. ORA - outer retinal atrophy. ORT - outer retinal tubulation. SRHM - subretinal hyper-reflective material. IRHM - intraretinal hyper-reflective material            ASSESSMENT/PLAN:    ICD-10-CM   1. Epiretinal membrane (ERM) of both eyes  H35.373 OCT, Retina - OU - Both Eyes    2. Essential hypertension  I10     3. Hypertensive retinopathy of both eyes  H35.033     4. Combined forms of age-related cataract of both eyes  H25.813      1,2. Epiretinal membrane, both eyes (OS>OD)  - pt reports history of ERM dates back to 2018-2019 -- initially noted by Dr. Radford Pax - ERM w/ pucker OU (OS > OD) -- OS with loss of foveal contour and +central thickening - BCVA OD: 20/20, OS: 20/40 -- stable - OCT w/o IRF/SRF and ERMs stable from prior - mild blurring OU, no metamorphopsia - no indication for surgery at this time - monitor for now - f/u 6 mos- DFE/OCT  3,4. Hypertensive retinopathy OU - discussed importance of tight BP control - monitor  5. Mixed Cataract OU - The symptoms of cataract, surgical options, and treatments and risks were discussed with patient. - discussed diagnosis and progression - discussed recommendation of having cataract surgery prior to any retina surgery, if possible - monitor  Ophthalmic Meds Ordered this visit:  No orders of the defined types  were placed in this encounter.    Return in about 6 months (around 02/28/2022) for f/u ERM OU, DFE, OCT.  There are no Patient Instructions on file for this visit.   This document serves as a record of services personally performed by Gardiner Sleeper, MD, PhD. It was created on their behalf by San Jetty. Owens Shark, OA an ophthalmic technician. The creation of this record is the provider's dictation and/or activities during the visit.    Electronically signed by: San Jetty. Owens Shark, [place] 02.21.2023 1:19 PM  Gardiner Sleeper, M.D., Ph.D. Diseases & Surgery of the Retina and Vitreous [facility]  I have reviewed the above documentation for accuracy and completeness, and I agree with the above. Gardiner Sleeper, M.D., Ph.D. 08/29/21 1:20 PM  Abbreviations: M myopia (nearsighted); A astigmatism; H hyperopia (farsighted); P presbyopia; Mrx spectacle prescription;  CTL contact lenses; OD right eye; OS left eye; OU both eyes  XT exotropia; ET esotropia; PEK punctate epithelial keratitis; PEE punctate epithelial erosions; DES dry eye syndrome; MGD meibomian gland dysfunction; ATs artificial tears; PFAT's preservative free artificial tears; [place] nuclear sclerotic cataract; PSC posterior subcapsular cataract; ERM epi-retinal membrane; PVD posterior vitreous detachment; RD retinal detachment; DM diabetes mellitus; DR diabetic retinopathy; NPDR non-proliferative diabetic retinopathy; PDR proliferative diabetic retinopathy; CSME clinically significant macular edema; DME diabetic macular edema; dbh dot blot hemorrhages; CWS cotton wool spot; POAG primary open angle glaucoma; C/D cup-to-disc ratio; HVF humphrey visual field; GVF goldmann visual field; OCT optical coherence tomography; IOP intraocular pressure; BRVO Branch retinal vein occlusion; CRVO central retinal vein occlusion; CRAO central retinal artery occlusion; BRAO branch retinal artery occlusion; RT retinal tear; SB scleral buckle; PPV pars plana vitrectomy; VH Vitreous hemorrhage; PRP panretinal laser photocoagulation; IVK intravitreal kenalog; VMT vitreomacular traction; MH Macular hole;  NVD neovascularization of the disc; NVE neovascularization elsewhere; AREDS age related eye disease study; ARMD age related macular degeneration; POAG primary open angle glaucoma; EBMD epithelial/anterior basement membrane dystrophy; ACIOL anterior chamber intraocular lens; IOL intraocular lens; PCIOL posterior chamber intraocular lens; Phaco/IOL phacoemulsification  with intraocular lens placement; [place] photorefractive keratectomy; LASIK laser assisted in situ keratomileusis; HTN hypertension; DM diabetes mellitus; COPD chronic obstructive pulmonary disease

## 2021-08-28 ENCOUNTER — Ambulatory Visit (INDEPENDENT_AMBULATORY_CARE_PROVIDER_SITE_OTHER): Payer: Medicare Other | Admitting: Ophthalmology

## 2021-08-28 ENCOUNTER — Other Ambulatory Visit: Payer: Self-pay

## 2021-08-28 ENCOUNTER — Encounter (INDEPENDENT_AMBULATORY_CARE_PROVIDER_SITE_OTHER): Payer: Self-pay | Admitting: Ophthalmology

## 2021-08-28 DIAGNOSIS — H25813 Combined forms of age-related cataract, bilateral: Secondary | ICD-10-CM

## 2021-08-28 DIAGNOSIS — H35033 Hypertensive retinopathy, bilateral: Secondary | ICD-10-CM

## 2021-08-28 DIAGNOSIS — I10 Essential (primary) hypertension: Secondary | ICD-10-CM | POA: Diagnosis not present

## 2021-08-28 DIAGNOSIS — H35373 Puckering of macula, bilateral: Secondary | ICD-10-CM | POA: Diagnosis not present

## 2021-08-29 ENCOUNTER — Encounter (INDEPENDENT_AMBULATORY_CARE_PROVIDER_SITE_OTHER): Payer: Self-pay | Admitting: Ophthalmology

## 2021-09-02 ENCOUNTER — Encounter (INDEPENDENT_AMBULATORY_CARE_PROVIDER_SITE_OTHER): Payer: Medicare Other | Admitting: Ophthalmology

## 2021-09-22 ENCOUNTER — Ambulatory Visit: Payer: Medicare Other | Attending: Surgery

## 2021-09-22 ENCOUNTER — Other Ambulatory Visit: Payer: Self-pay

## 2021-09-22 VITALS — Wt 218.5 lb

## 2021-09-22 DIAGNOSIS — Z483 Aftercare following surgery for neoplasm: Secondary | ICD-10-CM | POA: Insufficient documentation

## 2021-09-22 NOTE — Therapy (Signed)
?  OUTPATIENT PHYSICAL THERAPY SOZO SCREENING NOTE ? ? ?Patient Name: Laurie Mckenzie ?MRN: [id number] ?DOB:Aug 12, 1950, 71 y.o., female ?Today's Date: 09/22/2021 ? ?PCP: Dettinger, Fransisca Kaufmann, MD ?REFERRING PROVIDER: Erroll Luna, MD ? ? PT End of Session - 09/22/21 0831   ? ? Visit Number 2   # unchanged due to screen only  ? PT Start Time [phone]   ? PT Stop Time [phone]   ? PT Time Calculation (min) 7 min   ? Activity Tolerance Patient tolerated treatment well   ? Behavior During Therapy [facility] for tasks assessed/performed   ? ?  ?  ? ?  ? ? ?Past Medical History:  ?Diagnosis Date  ? Cancer [facility])   ? Headache   ? Hypertension   ? 186/106 today    195/108 07/27/2017  ? Hypothyroidism   ? Primary osteoarthritis of knee   ? Right  ? ?Past Surgical History:  ?Procedure Laterality Date  ? BREAST LUMPECTOMY WITH RADIOACTIVE SEED AND SENTINEL LYMPH NODE BIOPSY Left 03/27/2021  ? Procedure: LEFT BREAST LUMPECTOMY WITH RADIOACTIVE SEED AND SENTINEL LYMPH NODE BIOPSY;  Surgeon: Erroll Luna, MD;  Location: [place];  Service: General;  Laterality: Left;  ? COLONOSCOPY N/A 01/16/2015  ? Procedure: COLONOSCOPY;  Surgeon: Daneil Dolin, MD;  [facility];  Service: Endoscopy;  Laterality: N/A;  8:30 AM  ? KNEE ARTHROPLASTY Left 2011  ? [place]  ? TOTAL KNEE ARTHROPLASTY Right 08/23/2017  ? Procedure: TOTAL KNEE ARTHROPLASTY;  Surgeon: Elsie Saas, MD;  Location: [place];  Service: Orthopedics;  Laterality: Right;  ? ?Patient Active Problem List  ? Diagnosis Date Noted  ? Malignant neoplasm of upper-outer quadrant of left breast in female, estrogen receptor positive ([place]) 03/17/2021  ? Primary localized osteoarthritis of right knee 08/23/2017  ? Hypothyroidism   ? Hypertension   ? Chronic allergic rhinitis 03/18/2015  ? Special screening for malignant neoplasms, colon   ? Obesity (BMI 30-39.9) 02/20/2014  ? Hyperlipidemia 12/25/2013  ? ? ?REFERRING DIAG: left breast cancer at risk for lymphedema ? ?THERAPY  DIAG: Aftercare following surgery for neoplasm ? 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 She underwent a left lumpectomy and sentinel node biopsy on 03/27/2021 with 2 negative nodes removed. It is ER/PR positive and HER2 negative with a Ki67 of 15% ? ?PRECAUTIONS: left UE Lymphedema risk, None ? ?SUBJECTIVE: Pt returns for her 3 month L-Dex screen.  ? ?PAIN:  ?Are you having pain? No ? ?SOZO SCREENING: ?Patient was assessed today using the SOZO machine to determine the lymphedema index score. This was compared to her baseline score. It was determined that she is within the recommended range when compared to her baseline and no further action is needed at this time. She will continue SOZO screenings. These are done every 3 months for 2 years post operatively followed by every 6 months for 2 years, and then annually. ? ?Plan: Cont every 3 month L-Dex screen for up to 2 years from her SLNB (~03/28/2023) ? ? ?Otelia Limes, PTA ?09/22/2021, 8:38 AM ? ?  ? ?

## 2021-09-23 ENCOUNTER — Ambulatory Visit: Payer: Medicare Other | Admitting: Family

## 2021-09-23 ENCOUNTER — Telehealth: Payer: Self-pay | Admitting: *Deleted

## 2021-09-23 DIAGNOSIS — L723 Sebaceous cyst: Secondary | ICD-10-CM | POA: Diagnosis not present

## 2021-09-23 DIAGNOSIS — L02213 Cutaneous abscess of chest wall: Secondary | ICD-10-CM | POA: Diagnosis not present

## 2021-09-23 NOTE — Telephone Encounter (Signed)
This RN spoke with pt per her VM stating onset since last Thursday with " bump that now is inflamed and like a cyst - not sure if you want me to do anything to open it or if it need to be lanced? ? ?Area of concern is mid chest - left side ( side of history of breast cancer ). ? ?This RN informed her above would be best evaluated and treated per surgeon- Dr Brantley Stage.  ? ?This RN contacted Dr Josetta Huddle and spoke with Sharyn Lull- she states pt can be seen today by PA who works with Dr Brantley Stage. ?Sharyn Lull will call the patient with appt. ? ? ?This RN informed [place] of above. ? ?No further needs at this time. ?

## 2021-10-15 ENCOUNTER — Encounter (HOSPITAL_COMMUNITY): Payer: Self-pay

## 2021-10-16 DIAGNOSIS — Z9889 Other specified postprocedural states: Secondary | ICD-10-CM | POA: Diagnosis not present

## 2021-10-16 DIAGNOSIS — Z170 Estrogen receptor positive status [ER+]: Secondary | ICD-10-CM | POA: Diagnosis not present

## 2021-10-16 DIAGNOSIS — L723 Sebaceous cyst: Secondary | ICD-10-CM | POA: Diagnosis not present

## 2021-10-16 DIAGNOSIS — C50412 Malignant neoplasm of upper-outer quadrant of left female breast: Secondary | ICD-10-CM | POA: Diagnosis not present

## 2021-10-16 DIAGNOSIS — L089 Local infection of the skin and subcutaneous tissue, unspecified: Secondary | ICD-10-CM | POA: Diagnosis not present

## 2021-12-09 ENCOUNTER — Ambulatory Visit (INDEPENDENT_AMBULATORY_CARE_PROVIDER_SITE_OTHER): Payer: Medicare Other

## 2021-12-09 VITALS — Wt 218.0 lb

## 2021-12-09 DIAGNOSIS — Z0000 Encounter for general adult medical examination without abnormal findings: Secondary | ICD-10-CM

## 2021-12-09 NOTE — Patient Instructions (Signed)
Laurie Mckenzie , Thank you for taking time to come for your Medicare Wellness Visit. I appreciate your ongoing commitment to your health goals. Please review the following plan we discussed and let me know if I can assist you in the future.   Screening recommendations/referrals: Colonoscopy: Done 01/16/2015 - Repeat in 10 years Mammogram: Done 03/07/2021 - Repeat annually *appointment 03/30/22 Bone Density: Done 06/27/2021 - Repeat every 2 years Recommended yearly ophthalmology/optometry visit for glaucoma screening and checkup Recommended yearly dental visit for hygiene and checkup  Vaccinations: Influenza vaccine: Done 04/08/2021 - Repeat annually Pneumococcal vaccine: Done 10/13/2016 & 10/14/2017 Tdap vaccine: Done 07/19/2020 - Repeat in 10 years  Shingles vaccine: Done 04/24/2017 & 08/14/2017   Covid-19: Done 08/10/2019, 09/08/2019, & 04/23/2020  Advanced directives: in chart  Conditions/risks identified: Aim for 30 minutes of exercise or brisk walking, 6-8 glasses of water, and 5 servings of fruits and vegetables each day. See tips at the end of this summary for counting calories to lose weight.  Next appointment: Follow up in one year for your annual wellness visit    Preventive Care 65 Years and Older, Female Preventive care refers to lifestyle choices and visits with your health care provider that can promote health and wellness. What does preventive care include? A yearly physical exam. This is also called an annual well check. Dental exams once or twice a year. Routine eye exams. Ask your health care provider how often you should have your eyes checked. Personal lifestyle choices, including: Daily care of your teeth and gums. Regular physical activity. Eating a healthy diet. Avoiding tobacco and drug use. Limiting alcohol use. Practicing safe sex. Taking low-dose aspirin every day. Taking vitamin and mineral supplements as recommended by your health care provider. What happens during  an annual well check? The services and screenings done by your health care provider during your annual well check will depend on your age, overall health, lifestyle risk factors, and family history of disease. Counseling  Your health care provider may ask you questions about your: Alcohol use. Tobacco use. Drug use. Emotional well-being. Home and relationship well-being. Sexual activity. Eating habits. History of falls. Memory and ability to understand (cognition). Work and work Statistician. Reproductive health. Screening  You may have the following tests or measurements: Height, weight, and BMI. Blood pressure. Lipid and cholesterol levels. These may be checked every 5 years, or more frequently if you are over 73 years old. Skin check. Lung cancer screening. You may have this screening every year starting at age 60 if you have a 30-pack-year history of smoking and currently smoke or have quit within the past 15 years. Fecal occult blood test (FOBT) of the stool. You may have this test every year starting at age 59. Flexible sigmoidoscopy or colonoscopy. You may have a sigmoidoscopy every 5 years or a colonoscopy every 10 years starting at age 75. Hepatitis C blood test. Hepatitis B blood test. Sexually transmitted disease (STD) testing. Diabetes screening. This is done by checking your blood sugar (glucose) after you have not eaten for a while (fasting). You may have this done every 1-3 years. Bone density scan. This is done to screen for osteoporosis. You may have this done starting at age 61. Mammogram. This may be done every 1-2 years. Talk to your health care provider about how often you should have regular mammograms. Talk with your health care provider about your test results, treatment options, and if necessary, the need for more tests. Vaccines  Your health  care provider may recommend certain vaccines, such as: Influenza vaccine. This is recommended every year. Tetanus,  diphtheria, and acellular pertussis (Tdap, Td) vaccine. You may need a Td booster every 10 years. Zoster vaccine. You may need this after age 80. Pneumococcal 13-valent conjugate (PCV13) vaccine. One dose is recommended after age 75. Pneumococcal polysaccharide (PPSV23) vaccine. One dose is recommended after age 66. Talk to your health care provider about which screenings and vaccines you need and how often you need them. This information is not intended to replace advice given to you by your health care provider. Make sure you discuss any questions you have with your health care provider. Document Released: 07/12/2015 Document Revised: 03/04/2016 Document Reviewed: 04/16/2015 Elsevier Interactive Patient Education  2017 [place] Prevention in the Home Falls can cause injuries. They can happen to people of all ages. There are many things you can do to make your home safe and to help prevent falls. What can I do on the outside of my home? Regularly fix the edges of walkways and driveways and fix any cracks. Remove anything that might make you trip as you walk through a door, such as a raised step or threshold. Trim any bushes or trees on the path to your home. Use bright outdoor lighting. Clear any walking paths of anything that might make someone trip, such as rocks or tools. Regularly check to see if handrails are loose or broken. Make sure that both sides of any steps have handrails. Any raised decks and porches should have guardrails on the edges. Have any leaves, snow, or ice cleared regularly. Use sand or salt on walking paths during winter. Clean up any spills in your garage right away. This includes oil or grease spills. What can I do in the bathroom? Use night lights. Install grab bars by the toilet and in the tub and shower. Do not use towel bars as grab bars. Use non-skid mats or decals in the tub or shower. If you need to sit down in the shower, use a plastic,  non-slip stool. Keep the floor dry. Clean up any water that spills on the floor as soon as it happens. Remove soap buildup in the tub or shower regularly. Attach bath mats securely with double-sided non-slip rug tape. Do not have throw rugs and other things on the floor that can make you trip. What can I do in the bedroom? Use night lights. Make sure that you have a light by your bed that is easy to reach. Do not use any sheets or blankets that are too big for your bed. They should not hang down onto the floor. Have a firm chair that has side arms. You can use this for support while you get dressed. Do not have throw rugs and other things on the floor that can make you trip. What can I do in the kitchen? Clean up any spills right away. Avoid walking on wet floors. Keep items that you use a lot in easy-to-reach places. If you need to reach something above you, use a strong step stool that has a grab bar. Keep electrical cords out of the way. Do not use floor polish or wax that makes floors slippery. If you must use wax, use non-skid floor wax. Do not have throw rugs and other things on the floor that can make you trip. What can I do with my stairs? Do not leave any items on the stairs. Make sure that there are handrails on  both sides of the stairs and use them. Fix handrails that are broken or loose. Make sure that handrails are as long as the stairways. Check any carpeting to make sure that it is firmly attached to the stairs. Fix any carpet that is loose or worn. Avoid having throw rugs at the top or bottom of the stairs. If you do have throw rugs, attach them to the floor with carpet tape. Make sure that you have a light switch at the top of the stairs and the bottom of the stairs. If you do not have them, ask someone to add them for you. What else can I do to help prevent falls? Wear shoes that: Do not have high heels. Have rubber bottoms. Are comfortable and fit you well. Are closed  at the toe. Do not wear sandals. If you use a stepladder: Make sure that it is fully opened. Do not climb a closed stepladder. Make sure that both sides of the stepladder are locked into place. Ask someone to hold it for you, if possible. Clearly mark and make sure that you can see: Any grab bars or handrails. First and last steps. Where the edge of each step is. Use tools that help you move around (mobility aids) if they are needed. These include: Canes. Walkers. Scooters. Crutches. Turn on the lights when you go into a dark area. Replace any light bulbs as soon as they burn out. Set up your furniture so you have a clear path. Avoid moving your furniture around. If any of your floors are uneven, fix them. If there are any pets around you, be aware of where they are. Review your medicines with your doctor. Some medicines can make you feel dizzy. This can increase your chance of falling. Ask your doctor what other things that you can do to help prevent falls. This information is not intended to replace advice given to you by your health care provider. Make sure you discuss any questions you have with your health care provider. Document Released: 04/11/2009 Document Revised: 11/21/2015 Document Reviewed: 07/20/2014 Elsevier Interactive Patient Education  2017 [place] for [employer] Calories are units of energy. Your body needs a certain number of calories from food to keep going throughout the day. When you eat or drink more calories than your body needs, your body stores the extra calories mostly as fat. When you eat or drink fewer calories than your body needs, your body burns fat to get the energy it needs. Calorie counting means keeping track of how many calories you eat and drink each day. Calorie counting can be helpful if you need to lose weight. If you eat fewer calories than your body needs, you should lose weight. Ask your health care provider what a  healthy weight is for you. For calorie counting to work, you will need to eat the right number of calories each day to lose a healthy amount of weight per week. A dietitian can help you figure out how many calories you need in a day and will suggest ways to reach your calorie goal. A healthy amount of weight to lose each week is usually 1-2 lb (0.5-0.9 kg). This usually means that your daily calorie intake should be reduced by 500-750 calories. Eating 1,200-1,500 calories a day can help most women lose weight. Eating 1,500-1,800 calories a day can help most men lose weight. What do I need to know about calorie counting? Work with your health care provider  or dietitian to determine how many calories you should get each day. To meet your daily calorie goal, you will need to: Find out how many calories are in each food that you would like to eat. Try to do this before you eat. Decide how much of the food you plan to eat. Keep a food log. Do this by writing down what you ate and how many calories it had. To successfully lose weight, it is important to balance calorie counting with a healthy lifestyle that includes regular activity. Where do I find calorie information?  The number of calories in a food can be found on a Nutrition Facts label. If a food does not have a Nutrition Facts label, try to look up the calories online or ask your dietitian for help. Remember that calories are listed per serving. If you choose to have more than one serving of a food, you will have to multiply the calories per serving by the number of servings you plan to eat. For example, the label on a package of bread might say that a serving size is 1 slice and that there are 90 calories in a serving. If you eat 1 slice, you will have eaten 90 calories. If you eat 2 slices, you will have eaten 180 calories. How do I keep a food log? After each time that you eat, record the following in your food log as soon as possible: What you  ate. Be sure to include toppings, sauces, and other extras on the food. How much you ate. This can be measured in cups, ounces, or number of items. How many calories were in each food and drink. The total number of calories in the food you ate. Keep your food log near you, such as in a pocket-sized notebook or on an app or website on your mobile phone. Some programs will calculate calories for you and show you how many calories you have left to meet your daily goal. What are some portion-control tips? Know how many calories are in a serving. This will help you know how many servings you can have of a certain food. Use a measuring cup to measure serving sizes. You could also try weighing out portions on a kitchen scale. With time, you will be able to estimate serving sizes for some foods. Take time to put servings of different foods on your favorite plates or in your favorite bowls and cups so you know what a serving looks like. Try not to eat straight from a food's packaging, such as from a bag or box. Eating straight from the package makes it hard to see how much you are eating and can lead to overeating. Put the amount you would like to eat in a cup or on a plate to make sure you are eating the right portion. Use smaller plates, glasses, and bowls for smaller portions and to prevent overeating. Try not to multitask. For example, avoid watching TV or using your computer while eating. If it is time to eat, sit down at a table and enjoy your food. This will help you recognize when you are full. It will also help you be more mindful of what and how much you are eating. What are tips for following this plan? Reading food labels Check the calorie count compared with the serving size. The serving size may be smaller than what you are used to eating. Check the source of the calories. Try to choose foods that are high in  protein, fiber, and vitamins, and low in saturated fat, trans fat, and  sodium. Shopping Read nutrition labels while you shop. This will help you make healthy decisions about which foods to buy. Pay attention to nutrition labels for low-fat or fat-free foods. These foods sometimes have the same number of calories or more calories than the full-fat versions. They also often have added sugar, starch, or salt to make up for flavor that was removed with the fat. Make a grocery list of lower-calorie foods and stick to it. Cooking Try to cook your favorite foods in a healthier way. For example, try baking instead of frying. Use low-fat dairy products. Meal planning Use more fruits and vegetables. One-half of your plate should be fruits and vegetables. Include lean proteins, such as chicken, [place], and fish. Lifestyle Each week, aim to do one of the following: 150 minutes of moderate exercise, such as walking. 75 minutes of vigorous exercise, such as running. General information Know how many calories are in the foods you eat most often. This will help you calculate calorie counts faster. Find a way of tracking calories that works for you. Get creative. Try different apps or programs if writing down calories does not work for you. What foods should I eat?  Eat nutritious foods. It is better to have a nutritious, high-calorie food, such as an avocado, than a food with few nutrients, such as a bag of potato chips. Use your calories on foods and drinks that will fill you up and will not leave you hungry soon after eating. Examples of foods that fill you up are nuts and nut butters, vegetables, lean proteins, and high-fiber foods such as whole grains. High-fiber foods are foods with more than 5 g of fiber per serving. Pay attention to calories in drinks. Low-calorie drinks include water and unsweetened drinks. The items listed above may not be a complete list of foods and beverages you can eat. Contact a dietitian for more information. What foods should I limit? Limit  foods or drinks that are not good sources of vitamins, minerals, or protein or that are high in unhealthy fats. These include: Candy. Other sweets. Sodas, specialty coffee drinks, alcohol, and juice. The items listed above may not be a complete list of foods and beverages you should avoid. Contact a dietitian for more information. How do I count calories when eating out? Pay attention to portions. Often, portions are much larger when eating out. Try these tips to keep portions smaller: Consider sharing a meal instead of getting your own. If you get your own meal, eat only half of it. Before you start eating, ask for a container and put half of your meal into it. When available, consider ordering smaller portions from the menu instead of full portions. Pay attention to your food and drink choices. Knowing the way food is cooked and what is included with the meal can help you eat fewer calories. If calories are listed on the menu, choose the lower-calorie options. Choose dishes that include vegetables, fruits, whole grains, low-fat dairy products, and lean proteins. Choose items that are boiled, broiled, grilled, or steamed. Avoid items that are buttered, battered, fried, or served with cream sauce. Items labeled as crispy are usually fried, unless stated otherwise. Choose water, low-fat milk, unsweetened iced tea, or other drinks without added sugar. If you want an alcoholic beverage, choose a lower-calorie option, such as a glass of wine or light beer. Ask for dressings, sauces, and syrups on the side.  These are usually high in calories, so you should limit the amount you eat. If you want a salad, choose a garden salad and ask for grilled meats. Avoid extra toppings such as bacon, cheese, or fried items. Ask for the dressing on the side, or ask for olive oil and vinegar or lemon to use as dressing. Estimate how many servings of a food you are given. Knowing serving sizes will help you be aware of  how much food you are eating at restaurants. Where to find more information Centers for Disease Control and Prevention: http://www.wolf.info/ U.S. Department of Agriculture: http://www.wilson-mendoza.org/ Summary Calorie counting means keeping track of how many calories you eat and drink each day. If you eat fewer calories than your body needs, you should lose weight. A healthy amount of weight to lose per week is usually 1-2 lb (0.5-0.9 kg). This usually means reducing your daily calorie intake by 500-750 calories. The number of calories in a food can be found on a Nutrition Facts label. If a food does not have a Nutrition Facts label, try to look up the calories online or ask your dietitian for help. Use smaller plates, glasses, and bowls for smaller portions and to prevent overeating. Use your calories on foods and drinks that will fill you up and not leave you hungry shortly after a meal. This information is not intended to replace advice given to you by your health care provider. Make sure you discuss any questions you have with your health care provider. Document Revised: 07/27/2019 Document Reviewed: 07/27/2019 Elsevier Patient Education  [place].

## 2021-12-09 NOTE — Progress Notes (Cosign Needed)
Subjective:   Laurie Mckenzie is a 71 y.o. female who presents for Medicare Annual (Subsequent) preventive examination.  Virtual Visit via Telephone Note  I connected with  [name] on 12/09/21 at  3:30 PM EDT by telephone and verified that I am speaking with the correct person using two identifiers.  Location: Patient: Home Provider: [facility] Persons participating in the virtual visit: patient/Nurse Health Advisor   I discussed the limitations, risks, security and privacy concerns of performing an evaluation and management service by telephone and the availability of in person appointments. The patient expressed understanding and agreed to proceed.  Interactive audio and video telecommunications were attempted between this nurse and patient, however failed, due to patient having technical difficulties OR patient did not have access to video capability.  We continued and completed visit with audio only.  Some vital signs may be absent or patient reported.   [name], LPN   Review of Systems     Cardiac Risk Factors include: advanced age (>41mn, >>34women);obesity (BMI >30kg/m2);sedentary lifestyle;dyslipidemia;hypertension     Objective:    Today's Vitals   12/09/21 1532  Weight: 218 lb (98.9 kg)   Body mass index is 37.42 kg/m.     12/09/2021    3:43 PM 04/23/2021   10:10 AM 03/27/2021   10:13 AM 03/20/2021    3:19 PM 03/19/2021   11:51 AM 03/19/2021    9:33 AM 12/04/2020    9:14 AM  Advanced Directives  Does Patient Have a Medical Advance Directive? Yes No No No No Yes No  Type of AParamedicof [place]amosLiving will        Does patient want to make changes to medical advance directive?   No - Patient declined  No - Patient declined No - Patient declined   Copy of [place] Chart? Yes - validated most recent copy scanned in chart (See row information)        Would patient like information on creating a medical advance  directive?  Yes (ED - Information included in AVS) No - Patient declined No - Patient declined No - Patient declined  No - Patient declined    Current Medications (verified) Outpatient Encounter Medications as of 12/09/2021  Medication Sig   acetaminophen (TYLENOL) 500 MG tablet Take 1,000-1,500 mg by mouth every 6 (six) hours as needed for moderate pain or headache.    amLODipine (NORVASC) 5 MG tablet Take 1 tablet (5 mg total) by mouth daily.   anastrozole (ARIMIDEX) 1 MG tablet Take 1 tablet (1 mg total) by mouth daily. Start June 29, 2021   Ascorbic Acid (VITAMIN C) 100 MG tablet Take 100 mg by mouth daily.   atorvastatin (LIPITOR) 20 MG tablet Take 1 tablet (20 mg total) by mouth daily.   calcium carbonate (OSCAL) 1500 (600 Ca) MG TABS tablet Take 600 mg by mouth 2 (two) times daily with a meal.   Cholecalciferol (VITAMIN D3 PO) Take 1 capsule by mouth daily.   levothyroxine (SYNTHROID) 150 MCG tablet Take 1 tablet (150 mcg total) by mouth daily.   montelukast (SINGULAIR) 10 MG tablet Take 1 tablet (10 mg total) by mouth at bedtime.   Multiple Vitamin (MULTIVITAMIN) tablet Take 1 tablet by mouth daily.   triamcinolone (NASACORT) 55 MCG/ACT AERO nasal inhaler Place 2 sprays into the nose 2 (two) times daily.   zinc gluconate 50 MG tablet Take 50 mg by mouth daily.   diphenhydrAMINE (BENADRYL)  25 MG tablet Take 25 mg by mouth once. (Patient not taking: Reported on 12/09/2021)   EPINEPHrine 0.3 mg/0.3 mL IJ SOAJ injection Inject 0.3 mLs (0.3 mgttotal) into the muscle for 1 dose. (Patient not taking: Reported on 12/09/2021)   ibuprofen (ADVIL) 800 MG tablet Take 1 tablet (800 mg total) by mouth every 8 (eight) hours as needed. (Patient not taking: Reported on 12/09/2021)   [DISCONTINUED] triamcinolone cream (KENALOG) 0.1 % Apply 1 application topically 2 (two) times daily. (Patient not taking: Reported on 12/09/2021)   No facility-administered encounter medications on file as of 12/09/2021.     Allergies (verified) Sulfa antibiotics, Bee venom, Fish allergy, and Ivp dye [iodinated contrast media]   History: Past Medical History:  Diagnosis Date   Cancer ([place])    Headache    Hypertension    186/106 today    195/108 07/27/2017   Hypothyroidism    Primary osteoarthritis of knee    Right   Past Surgical History:  Procedure Laterality Date   BREAST LUMPECTOMY WITH RADIOACTIVE SEED AND SENTINEL LYMPH NODE BIOPSY Left 03/27/2021   Procedure: LEFT BREAST LUMPECTOMY WITH RADIOACTIVE SEED AND SENTINEL LYMPH NODE BIOPSY;  Surgeon: Erroll Luna, MD;  Location: [facility];  Service: General;  Laterality: Left;   COLONOSCOPY N/A 01/16/2015   Procedure: COLONOSCOPY;  Surgeon: Daneil Dolin, MD;  [facility];  Service: Endoscopy;  Laterality: N/A;  8:30 AM   KNEE ARTHROPLASTY Left 2011   [place]   TOTAL KNEE ARTHROPLASTY Right 08/23/2017   Procedure: TOTAL KNEE ARTHROPLASTY;  Surgeon: Elsie Saas, MD;  Location: [facility];  Service: Orthopedics;  Laterality: Right;   Family History  Problem Relation Age of Onset   Hypertension Mother    Lung disease Father    Social History   Socioeconomic History   Marital status: Single    Spouse name: Not on file   Number of children: 0   Years of education: Not on file   Highest education level: Not on file  Occupational History   Occupation: retired    Comment: 2017  Tobacco Use   Smoking status: Never   Smokeless tobacco: Never  Vaping Use   Vaping Use: Never used  Substance and Sexual Activity   Alcohol use: No   Drug use: No   Sexual activity: Not Currently    Comment: not in relationship currently  Other Topics Concern   Not on file  Social History Narrative   Not on file   Social Determinants of Health   Financial Resource Strain: Low Risk  (12/09/2021)   Overall Financial Resource Strain (CARDIA)    Difficulty of Paying Living Expenses: Not hard at all  Food Insecurity: No Food  Insecurity (12/09/2021)   Hunger Vital Sign    Worried About Running Out of Food in the Last Year: Never true    Juab in the Last Year: Never true  Transportation Needs: No Transportation Needs (12/09/2021)   PRAPARE - Hydrologist (Medical): No    Lack of Transportation (Non-Medical): No  Physical Activity: Insufficiently Active (12/09/2021)   Exercise Vital Sign    Days of Exercise per Week: 7 days    Minutes of Exercise per Session: 20 min  Stress: No Stress Concern Present (12/09/2021)   [place]    Feeling of Stress : Not at all  Social Connections: Moderately Integrated (12/09/2021)  Social Licensed conveyancer [NHANES]    Frequency of Communication with Friends and Family: More than three times a week    Frequency of Social Gatherings with Friends and Family: More than three times a week    Attends Religious Services: More than 4 times per year    Active Member of [employer] or Organizations: Yes    Attends Music therapist: More than 4 times per year    Marital Status: Never married    Tobacco Counseling Counseling given: Not Answered   Clinical Intake:  Pre-visit preparation completed: Yes  Pain : No/denies pain     BMI - recorded: 37.42 Nutritional Status: BMI > 30  Obese Nutritional Risks: None Diabetes: No  How often do you need to have someone help you when you read instructions, pamphlets, or other written materials from your doctor or pharmacy?: 1 - Never  Diabetic? no  Interpreter Needed?: No  Information entered by :: Evonte Prestage, LPN   Activities of Daily Living    12/09/2021    3:43 PM 03/27/2021   10:20 AM  In your present state of health, do you have any difficulty performing the following activities:  Hearing? 0 0  Vision? 0   Difficulty concentrating or making decisions? 0 0  Walking or climbing stairs? 0 1  Dressing  or bathing? 0 0  Doing errands, shopping? 0   Preparing Food and eating ? N   Using the Toilet? N   In the past six months, have you accidently leaked urine? N   Do you have problems with loss of bowel control? N   Managing your Medications? N   Managing your Finances? N   Housekeeping or managing your Housekeeping? N     Patient Care Team: Dettinger, Fransisca Kaufmann, MD as PCP - General (Family Medicine) Erroll Luna, MD as Consulting Physician (General Surgery) Kyung Rudd, MD as Consulting Physician (Radiation Oncology) Bernarda Caffey, MD as Consulting Physician (Ophthalmology) Benay Pike, MD as Consulting Physician (Hematology and Oncology) Leticia Clas, OD (Optometry)  Indicate any recent Medical Services you may have received from other than [facility] providers in the past year (date may be approximate).     Assessment:   This is a routine wellness examination for Hedi.  Hearing/Vision screen Hearing Screening - Comments:: Denies hearing difficulties   Vision Screening - Comments:: Wears rx glasses - up to date with routine eye exams with Rosana Hoes in [place] issues and exercise activities discussed: Current Exercise Habits: Home exercise routine, Type of exercise: walking, Time (Minutes): 20, Frequency (Times/Week): 7, Weekly Exercise (Minutes/Week): 140, Intensity: Mild, Exercise limited by: orthopedic condition(s)   Goals Addressed             This Visit's Progress    Patient Stated   On track    12/09/2021 AWV Goal: Exercise for General Health  Patient will verbalize understanding of the benefits of increased physical activity: Exercising regularly is important. It will improve your overall fitness, flexibility, and endurance. Regular exercise also will improve your overall health. It can help you control your weight, reduce stress, and improve your bone density. Over the next year, patient will increase physical activity as tolerated with a goal of at least  150 minutes of moderate physical activity per week.  You can tell that you are exercising at a moderate intensity if your heart starts beating faster and you start breathing faster but can still hold a conversation. Moderate-intensity exercise ideas include: Walking 1  mile (1.6 km) in about 15 minutes [place] [employer] aerobics Patient will verbalize understanding of everyday activities that increase physical activity by providing examples like the following: Yard work, such as: Sales promotion account executive Gardening Washing windows or floors Patient will be able to explain general safety guidelines for exercising:  Before you start a new exercise program, talk with your health care provider. Do not exercise so much that you hurt yourself, feel dizzy, or get very short of breath. Wear comfortable clothes and wear shoes with good support. Drink plenty of water while you exercise to prevent dehydration or heat stroke. Work out until your breathing and your heartbeat get faster.      Weight (lb) < 200 lb (90.7 kg)   218 lb (98.9 kg)    12/09/2021 -She hopes to lose weight - stick to fruits, vegetables and lean meats - try to be active 30 minutes each day       Depression Screen    12/09/2021    3:42 PM 07/21/2021    9:48 AM 01/16/2021    8:02 AM 12/09/2020    8:35 AM 12/04/2020    9:26 AM 07/19/2020    9:04 AM 01/17/2020    8:01 AM  PHQ 2/9 Scores  PHQ - 2 Score 0 0 0 0 0 0 0    Fall Risk    12/09/2021    3:33 PM 07/21/2021    9:48 AM 01/16/2021    8:02 AM 12/09/2020    8:35 AM 12/04/2020    9:25 AM  Fall Risk   Falls in the past year? 0 0 0 0 0  Number falls in past yr: 0      Injury with Fall? 0      Risk for fall due to : Orthopedic patient      Follow up Falls prevention discussed        [place]:  Any stairs in or around the home? Yes  If so, are  there any without handrails? Yes  Home free of loose throw rugs in walkways, pet beds, electrical cords, etc? No  Adequate lighting in your home to reduce risk of falls? No   ASSISTIVE DEVICES UTILIZED TO PREVENT FALLS:  Life alert? No  Use of a cane, walker or w/c? No  Grab bars in the bathroom? Yes  Shower chair or bench in shower? Yes  Elevated toilet seat or a handicapped toilet? Yes   TIMED UP AND GO:  Was the test performed? No . Telephonic visit  Cognitive Function:        12/09/2021    3:45 PM 12/04/2020    9:22 AM  6CIT Screen  What Year? 0 points 0 points  What month? 0 points 0 points  What time? 0 points 0 points  Count back from 20 0 points 0 points  Months in reverse 0 points 0 points  Repeat phrase 0 points 0 points  Total Score 0 points 0 points    Immunizations Immunization History  Administered Date(s) Administered   Fluad Quad(high Dose 65+) 03/21/2019, 03/26/2020, 04/08/2021   Influenza Split 03/29/2013   Influenza, High Dose Seasonal PF 04/15/2017, 04/18/2018   Influenza,inj,Quad PF,6+ Mos 04/14/2016   Influenza,trivalent, recombinat, inj, PF 04/26/2014   Influenza-Unspecified 04/15/2015, 04/15/2017, 04/18/2018   Moderna Sars-Covid-2 Vaccination 08/10/2019, 09/08/2019, 04/23/2020   Pneumococcal Conjugate-13 10/13/2016   Pneumococcal Polysaccharide-23  02/20/2012, 10/14/2017   Tdap 02/19/2010, 07/19/2020   Zoster Recombinat (Shingrix) 04/24/2017, 08/14/2017   Zoster, Live 02/20/2012    TDAP status: Up to date  Flu Vaccine status: Up to date  Pneumococcal vaccine status: Up to date  Covid-19 vaccine status: Completed vaccines  Qualifies for Shingles Vaccine? Yes   Zostavax completed Yes   Shingrix Completed?: Yes  Screening Tests Health Maintenance  Topic Date Due   COVID-19 Vaccine (4 - Booster for Moderna series) 02/09/2022 (Originally 06/18/2020)   INFLUENZA VACCINE  01/27/2022   MAMMOGRAM  03/07/2022   DEXA SCAN  06/28/2023    COLONOSCOPY (Pts 45-29yr Insurance coverage will need to be confirmed)  01/15/2025   TETANUS/TDAP  07/19/2030   Pneumonia Vaccine 71 Years old  Completed   Hepatitis C Screening  Completed   Zoster Vaccines- Shingrix  Completed   HPV VACCINES  Aged Out    Health Maintenance  There are no preventive care reminders to display for this patient.  Colorectal cancer screening: Type of screening: Colonoscopy. Completed 01/16/2015. Repeat every 10 years  Mammogram status: Completed 03/07/2021. Repeat every year  Bone Density status: Completed 06/27/2021. Results reflect: Bone density results: OSTEOPENIA. Repeat every 2 years.  Lung Cancer Screening: (Low Dose CT Chest recommended if Age 71-80years, 30 pack-year currently smoking OR have quit w/in 15years.) does not qualify.   Additional Screening:  Hepatitis C Screening: does qualify; Completed 01/17/2019  Vision Screening: Recommended annual ophthalmology exams for early detection of glaucoma and other disorders of the eye. Is the patient up to date with their annual eye exam?  Yes  Who is the provider or what is the name of the office in which the patient attends annual eye exams? [facility] [place]- also seeing ZCoralyn Pearfor her cataracts and wrinkle on left eye If pt is not established with a provider, would they like to be referred to a provider to establish care? No .   Dental Screening: Recommended annual dental exams for proper oral hygiene  Community Resource Referral / Chronic Care Management: CRR required this visit?  No   CCM required this visit?  No      Plan:     I have personally reviewed and noted the following in the patient's chart:   Medical and social history Use of alcohol, tobacco or illicit drugs  Current medications and supplements including opioid prescriptions.  Functional ability and status Nutritional status Physical activity Advanced directives List of other physicians Hospitalizations, surgeries, and  ER visits in previous 12 months Vitals Screenings to include cognitive, depression, and falls Referrals and appointments  In addition, I have reviewed and discussed with patient certain preventive protocols, quality metrics, and best practice recommendations. A written personalized care plan for preventive services as well as general preventive health recommendations were provided to patient.     ASandrea Hammond LPN   [id number]  Nurse Notes: None

## 2021-12-22 ENCOUNTER — Ambulatory Visit: Payer: Medicare Other | Attending: Surgery

## 2021-12-22 VITALS — Wt 218.2 lb

## 2021-12-22 DIAGNOSIS — Z483 Aftercare following surgery for neoplasm: Secondary | ICD-10-CM | POA: Insufficient documentation

## 2022-01-08 ENCOUNTER — Telehealth: Payer: Self-pay | Admitting: Family Medicine

## 2022-01-08 DIAGNOSIS — E039 Hypothyroidism, unspecified: Secondary | ICD-10-CM

## 2022-01-08 DIAGNOSIS — E782 Mixed hyperlipidemia: Secondary | ICD-10-CM

## 2022-01-08 DIAGNOSIS — I10 Essential (primary) hypertension: Secondary | ICD-10-CM

## 2022-01-08 NOTE — Telephone Encounter (Signed)
Future lab orders placed and left message advising her future labs placed and to call back with any further questions or concerns.

## 2022-01-12 ENCOUNTER — Other Ambulatory Visit: Payer: Medicare Other

## 2022-01-12 DIAGNOSIS — I10 Essential (primary) hypertension: Secondary | ICD-10-CM | POA: Diagnosis not present

## 2022-01-12 DIAGNOSIS — E039 Hypothyroidism, unspecified: Secondary | ICD-10-CM

## 2022-01-12 DIAGNOSIS — E782 Mixed hyperlipidemia: Secondary | ICD-10-CM

## 2022-01-13 LAB — CBC WITH DIFFERENTIAL/PLATELET
Basophils Absolute: 0.1 10*3/uL (ref 0.0–0.2)
Basos: 1 %
EOS (ABSOLUTE): 0.2 10*3/uL (ref 0.0–0.4)
Eos: 2 %
Hematocrit: 43.4 % (ref 34.0–46.6)
Hemoglobin: 14.5 g/dL (ref 11.1–15.9)
Immature Grans (Abs): 0.1 10*3/uL (ref 0.0–0.1)
Immature Granulocytes: 1 %
Lymphocytes Absolute: 1.6 10*3/uL (ref 0.7–3.1)
Lymphs: 21 %
MCH: 30.8 pg (ref 26.6–33.0)
MCHC: 33.4 g/dL (ref 31.5–35.7)
MCV: 92 fL (ref 79–97)
Monocytes Absolute: 0.7 10*3/uL (ref 0.1–0.9)
Monocytes: 9 %
Neutrophils Absolute: 5.3 10*3/uL (ref 1.4–7.0)
Neutrophils: 66 %
Platelets: 261 10*3/uL (ref 150–450)
RBC: 4.71 x10E6/uL (ref 3.77–5.28)
RDW: 12.6 % (ref 11.7–15.4)
WBC: 7.9 10*3/uL (ref 3.4–10.8)

## 2022-01-13 LAB — CMP14+EGFR
ALT: 31 IU/L (ref 0–32)
AST: 22 IU/L (ref 0–40)
Albumin/Globulin Ratio: 1.8 (ref 1.2–2.2)
Albumin: 4.7 g/dL (ref 3.9–4.9)
Alkaline Phosphatase: 117 IU/L (ref 44–121)
BUN/Creatinine Ratio: 19 (ref 12–28)
BUN: 15 mg/dL (ref 8–27)
Bilirubin Total: 0.2 mg/dL (ref 0.0–1.2)
CO2: 23 mmol/L (ref 20–29)
Calcium: 10.3 mg/dL (ref 8.7–10.3)
Chloride: 101 mmol/L (ref 96–106)
Creatinine, Ser: 0.79 mg/dL (ref 0.57–1.00)
Globulin, Total: 2.6 g/dL (ref 1.5–4.5)
Glucose: 122 mg/dL — ABNORMAL HIGH (ref 70–99)
Potassium: 4.6 mmol/L (ref 3.5–5.2)
Sodium: 143 mmol/L (ref 134–144)
Total Protein: 7.3 g/dL (ref 6.0–8.5)
eGFR: 80 mL/min/{1.73_m2} (ref >59)

## 2022-01-13 LAB — LIPID PANEL
Chol/HDL Ratio: 4 ratio (ref 0.0–4.4)
Cholesterol, Total: 145 mg/dL (ref 100–199)
HDL: 36 mg/dL — ABNORMAL LOW (ref >39)
LDL Chol Calc (NIH): 59 mg/dL (ref 0–99)
Triglycerides: 321 mg/dL — ABNORMAL HIGH (ref 0–149)
VLDL Cholesterol Cal: 50 mg/dL — ABNORMAL HIGH (ref 5–40)

## 2022-01-13 LAB — TSH: TSH: 2.8 u[IU]/mL (ref 0.450–4.500)

## 2022-01-14 ENCOUNTER — Encounter: Payer: Self-pay | Admitting: Family Medicine

## 2022-01-14 ENCOUNTER — Ambulatory Visit (INDEPENDENT_AMBULATORY_CARE_PROVIDER_SITE_OTHER): Payer: Medicare Other | Admitting: Family Medicine

## 2022-01-14 VITALS — BP 140/81 | HR 90 | Ht 64.0 in | Wt 218.0 lb

## 2022-01-14 DIAGNOSIS — I10 Essential (primary) hypertension: Secondary | ICD-10-CM

## 2022-01-14 DIAGNOSIS — E039 Hypothyroidism, unspecified: Secondary | ICD-10-CM | POA: Diagnosis not present

## 2022-01-14 DIAGNOSIS — E782 Mixed hyperlipidemia: Secondary | ICD-10-CM

## 2022-01-14 MED ORDER — MONTELUKAST SODIUM 10 MG PO TABS: 10.0000 mg | ORAL_TABLET | Freq: Every day | ORAL | 3 refills | Status: DC

## 2022-01-14 NOTE — Progress Notes (Signed)
BP 140/81   Pulse 90   Ht '5\' 4"'$  (1.626 m)   Wt 218 lb (98.9 kg)   SpO2 97%   BMI 37.42 kg/m    Subjective:   Patient ID: Laurie Mckenzie, female    DOB: [birth year], 71 y.o.   MRN: [id number]  HPI: Laurie Mckenzie is a 71 y.o. female presenting on 01/14/2022 for Medical Management of Chronic Issues, Hypothyroidism, and Hypertension   HPI Hypothyroidism recheck Patient is coming in for thyroid recheck today as well. They deny any issues with hair changes or heat or cold problems or diarrhea or constipation. They deny any chest pain or palpitations. They are currently on levothyroxine 150 micrograms   Hypertension Patient is currently on amlodipine, and their blood pressure today is 140/81. Patient denies any lightheadedness or dizziness. Patient denies headaches, blurred vision, chest pains, shortness of breath, or weakness. Denies any side effects from medication and is content with current medication.   Hyperlipidemia and hypertriglyceridemia Patient is coming in for recheck of his hyperlipidemia. The patient is currently taking atorvastatin. They deny any issues with myalgias or history of liver damage from it. They deny any focal numbness or weakness or chest pain.  Patient admits that her triglycerides and blood sugars are up because she has been having more sugary beverages especially with summertime coming along  Relevant past medical, surgical, family and social history reviewed and updated as indicated. Interim medical history since our last visit reviewed. Allergies and medications reviewed and updated.  Review of Systems  Constitutional:  Negative for chills and fever.  HENT:  Negative for congestion and ear pain.   Eyes:  Negative for visual disturbance.  Respiratory:  Negative for chest tightness and shortness of breath.   Cardiovascular:  Negative for chest pain and leg swelling.  Genitourinary:  Negative for difficulty urinating and dysuria.  Musculoskeletal:  Negative for  back pain and gait problem.  Skin:  Negative for rash.  Neurological:  Negative for dizziness, light-headedness and headaches.  Psychiatric/Behavioral:  Negative for agitation and behavioral problems.   All other systems reviewed and are negative.   Per HPI unless specifically indicated above   Allergies as of 01/14/2022       Reactions   Sulfa Antibiotics Other (See Comments)   Bee Venom Swelling, Other (See Comments)   Red and hot to site   Fish Allergy Hives   Ivp Dye [iodinated Contrast Media] Other (See Comments)   MD advised not to use, due to fish allergies        Medication List        Accurate as of January 14, 2022  8:23 AM. If you have any questions, ask your nurse or doctor.          acetaminophen 500 MG tablet Commonly known as: TYLENOL Take 1,000-1,500 mg by mouth every 6 (six) hours as needed for moderate pain or headache.   amLODipine 5 MG tablet Commonly known as: NORVASC Take 1 tablet (5 mg total) by mouth daily.   anastrozole 1 MG tablet Commonly known as: ARIMIDEX Take 1 tablet (1 mg total) by mouth daily. Start June 29, 2021   atorvastatin 20 MG tablet Commonly known as: LIPITOR Take 1 tablet (20 mg total) by mouth daily.   calcium carbonate 1500 (600 Ca) MG Tabs tablet Commonly known as: OSCAL Take 600 mg by mouth 2 (two) times daily with a meal.   diphenhydrAMINE 25 MG tablet Commonly known as: BENADRYL Take 25  mg by mouth once.   EPINEPHrine 0.3 mg/0.3 mL Soaj injection Commonly known as: EPI-PEN Inject 0.3 mLs (0.3 mgttotal) into the muscle for 1 dose.   ibuprofen 800 MG tablet Commonly known as: ADVIL Take 1 tablet (800 mg total) by mouth every 8 (eight) hours as needed.   levothyroxine 150 MCG tablet Commonly known as: SYNTHROID Take 1 tablet (150 mcg total) by mouth daily.   montelukast 10 MG tablet Commonly known as: SINGULAIR Take 1 tablet (10 mg total) by mouth at bedtime.   multivitamin tablet Take 1 tablet by  mouth daily.   triamcinolone 55 MCG/ACT Aero nasal inhaler Commonly known as: NASACORT Place 2 sprays into the nose 2 (two) times daily.   vitamin C 100 MG tablet Take 100 mg by mouth daily.   VITAMIN D3 PO Take 1 capsule by mouth daily.   zinc gluconate 50 MG tablet Take 50 mg by mouth daily.         Objective:   BP 140/81   Pulse 90   Ht 5\' 4"  (1.626 m)   Wt 218 lb (98.9 kg)   SpO2 97%   BMI 37.42 kg/m   Wt Readings from Last 3 Encounters:  01/14/22 218 lb (98.9 kg)  12/22/21 218 lb 4 oz (99 kg)  12/09/21 218 lb (98.9 kg)    Physical Exam Vitals and nursing note reviewed.  Constitutional:      General: She is not in acute distress.    Appearance: She is well-developed. She is not diaphoretic.  Eyes:     Conjunctiva/sclera: Conjunctivae normal.     Pupils: Pupils are equal, round, and reactive to light.  Cardiovascular:     Rate and Rhythm: Normal rate and regular rhythm.     Heart sounds: Normal heart sounds. No murmur heard. Pulmonary:     Effort: Pulmonary effort is normal. No respiratory distress.     Breath sounds: Normal breath sounds. No wheezing.  Musculoskeletal:        General: No swelling or tenderness. Normal range of motion.  Skin:    General: Skin is warm and dry.     Findings: No rash.  Neurological:     Mental Status: She is alert and oriented to person, place, and time.     Coordination: Coordination normal.  Psychiatric:        Behavior: Behavior normal.     Results for orders placed or performed in visit on 01/12/22  TSH  Result Value Ref Range   TSH 2.800 0.450 - 4.500 uIU/mL  Lipid panel  Result Value Ref Range   Cholesterol, Total 145 100 - 199 mg/dL   Triglycerides 01/14/22 (H) 0 - 149 mg/dL   HDL 36 (L) 077 mg/dL   VLDL Cholesterol Cal 50 (H) 5 - 40 mg/dL   LDL Chol Calc (NIH) 59 0 - 99 mg/dL   Chol/HDL Ratio 4.0 0.0 - 4.4 ratio  CMP14+EGFR  Result Value Ref Range   Glucose 122 (H) 70 - 99 mg/dL   BUN 15 8 - 27 mg/dL    Creatinine, Ser >07 0.57 - 1.00 mg/dL   eGFR 80 3.95 >14   BUN/Creatinine Ratio 19 12 - 28   Sodium 143 134 - 144 mmol/L   Potassium 4.6 3.5 - 5.2 mmol/L   Chloride 101 96 - 106 mmol/L   CO2 23 20 - 29 mmol/L   Calcium 10.3 8.7 - 10.3 mg/dL   Total Protein 7.3 6.0 - 8.5 g/dL  Albumin 4.7 3.9 - 4.9 g/dL   Globulin, Total 2.6 1.5 - 4.5 g/dL   Albumin/Globulin Ratio 1.8 1.2 - 2.2   Bilirubin Total 0.2 0.0 - 1.2 mg/dL   Alkaline Phosphatase 117 44 - 121 IU/L   AST 22 0 - 40 IU/L   ALT 31 0 - 32 IU/L  CBC with Differential/Platelet  Result Value Ref Range   WBC 7.9 3.4 - 10.8 x10E3/uL   RBC 4.71 3.77 - 5.28 x10E6/uL   Hemoglobin 14.5 11.1 - 15.9 g/dL   Hematocrit 43.4 34.0 - 46.6 %   MCV 92 79 - 97 fL   MCH 30.8 26.6 - 33.0 pg   MCHC 33.4 31.5 - 35.7 g/dL   RDW 12.6 11.7 - 15.4 %   Platelets 261 150 - 450 x10E3/uL   Neutrophils 66 Not Estab. %   Lymphs 21 Not Estab. %   Monocytes 9 Not Estab. %   Eos 2 Not Estab. %   Basos 1 Not Estab. %   Neutrophils Absolute 5.3 1.4 - 7.0 x10E3/uL   Lymphocytes Absolute 1.6 0.7 - 3.1 x10E3/uL   Monocytes Absolute 0.7 0.1 - 0.9 x10E3/uL   EOS (ABSOLUTE) 0.2 0.0 - 0.4 x10E3/uL   Basophils Absolute 0.1 0.0 - 0.2 x10E3/uL   Immature Granulocytes 1 Not Estab. %   Immature Grans (Abs) 0.1 0.0 - 0.1 x10E3/uL    Assessment & Plan:   Problem List Items Addressed This Visit       Cardiovascular and Mediastinum   Hypertension     Endocrine   Hypothyroidism - Primary     Other   Hyperlipidemia    Refill Singulair for chronic allergies.  Discussed diet and lifestyle changes and reducing carbohydrate intake. Follow up plan: Return in about 6 months (around 07/17/2022), or if symptoms worsen or fail to improve, for Hypertension hypothyroidism hyperlipidemia.  Counseling provided for all of the vaccine components No orders of the defined types were placed in this encounter.   Caryl Pina, MD [facility] 01/14/2022, 8:23 AM

## 2022-02-09 ENCOUNTER — Other Ambulatory Visit: Payer: Self-pay

## 2022-02-09 ENCOUNTER — Encounter: Payer: Self-pay | Admitting: Hematology and Oncology

## 2022-02-09 ENCOUNTER — Inpatient Hospital Stay: Payer: Medicare Other | Attending: Hematology and Oncology | Admitting: Hematology and Oncology

## 2022-02-09 VITALS — BP 171/86 | HR 90 | Temp 97.9°F | Resp 18 | Wt 220.2 lb

## 2022-02-09 DIAGNOSIS — N951 Menopausal and female climacteric states: Secondary | ICD-10-CM | POA: Diagnosis not present

## 2022-02-09 DIAGNOSIS — Z79811 Long term (current) use of aromatase inhibitors: Secondary | ICD-10-CM | POA: Insufficient documentation

## 2022-02-09 DIAGNOSIS — M8580 Other specified disorders of bone density and structure, unspecified site: Secondary | ICD-10-CM | POA: Diagnosis not present

## 2022-02-09 DIAGNOSIS — Z923 Personal history of irradiation: Secondary | ICD-10-CM | POA: Insufficient documentation

## 2022-02-09 DIAGNOSIS — Z170 Estrogen receptor positive status [ER+]: Secondary | ICD-10-CM | POA: Diagnosis not present

## 2022-02-09 DIAGNOSIS — C50412 Malignant neoplasm of upper-outer quadrant of left female breast: Secondary | ICD-10-CM | POA: Diagnosis not present

## 2022-02-09 NOTE — Progress Notes (Signed)
SURVIVORSHIP VISIT:   BRIEF ONCOLOGIC HISTORY:  Oncology History  Malignant neoplasm of upper-outer quadrant of left breast in female, estrogen receptor positive ([place])  03/12/2021 Initial Diagnosis   status post left breast upper outer quadrant biopsy 03/12/2021 for a clinical T1c N0, stage IA invasive ductal carcinoma, grade 2, estrogen and progesterone receptor positive, HER2 not amplified (FISH results pending) with an MIB-1 of 30%   03/19/2021 Cancer Staging   Staging form: Breast, AJCC 8th Edition - Clinical stage from 03/19/2021: Stage IA (cT1c, cN0, cM0, G2, ER+, PR+, HER2-) - Signed by Chauncey Cruel, MD on 03/19/2021 Stage prefix: Initial diagnosis Histologic grading system: 3 grade system Laterality: Left Staged by: Pathologist and managing physician Stage used in treatment planning: Yes National guidelines used in treatment planning: Yes Type of national guideline used in treatment planning: NCCN   03/27/2021 Cancer Staging   Staging form: Breast, AJCC 8th Edition - Pathologic stage from 03/27/2021: Stage IA (pT1b, pN0, cM0, G3, ER+, PR+, HER2-) - Signed by Gardenia Phlegm, NP on 07/19/2021 Histologic grading system: 3 grade system   03/27/2021 Surgery   status post left lumpectomy and sentinel lymph node sampling 03/27/2021 for a pT1b pN0, stage IA invasive ductal carcinoma, grade 3, with negative margins.             (a) 2 left axillary lymph nodes were removed     03/27/2021 Oncotype testing   Oncotype score of 24 predicts a risk of recurrence outside the breast over the next 9 years of 10% if the patient's only systemic therapy is antiestrogens for 5 years.  It also predicts minimal benefit from adjuvant chemotherapy   05/01/2021 - 06/03/2021 Radiation Therapy   05/01/2021 through 06/03/2021 Site Technique Total Dose (Gy) Dose per Fx (Gy) Completed Fx Beam Energies  Breast, Left: Breast_Lt 3D 42.56/42.56 2.66 16/16 10X  Breast, Left: Breast_Lt_Bst 3D 8/8 2 4/4 6X,  10X     05/29/2021 -  Anti-estrogen oral therapy   Anastrozole daily     INTERVAL HISTORY:   She is taking anastrozole daily with good tolerance.   She has been noting intermittent hot flashes. No other major adverse effects. She denies any changes in her breast Rest of the pertinent 10 point ROS reviewed and negative.  REVIEW OF SYSTEMS:  Review of Systems  Constitutional:  Negative for appetite change, chills, fatigue, fever and unexpected weight change.  HENT:   Negative for hearing loss, lump/mass and trouble swallowing.   Eyes:  Negative for eye problems and icterus.  Respiratory:  Negative for chest tightness, cough and shortness of breath.   Cardiovascular:  Negative for chest pain, leg swelling and palpitations.  Gastrointestinal:  Negative for abdominal distention, abdominal pain, constipation, diarrhea, nausea and vomiting.  Endocrine: Positive for hot flashes.  Genitourinary:  Negative for difficulty urinating.   Musculoskeletal:  Negative for arthralgias.  Skin:  Negative for itching and rash.  Neurological:  Negative for dizziness, extremity weakness, headaches and numbness.  Hematological:  Negative for adenopathy. Does not bruise/bleed easily.  Psychiatric/Behavioral:  Negative for depression. The patient is not nervous/anxious.    Breast: Denies any new nodularity, masses, tenderness, nipple changes, or nipple discharge.   ONCOLOGY TREATMENT TEAM:  1. Surgeon:  Dr. Brantley Stage at [facility] 2. Medical Oncologist: Dr. Chryl Heck  3. Radiation Oncologist: Dr. Lisbeth Renshaw    PAST MEDICAL/SURGICAL HISTORY:  Past Medical History:  Diagnosis Date   Cancer [facility])    Headache    Hypertension  186/106 today    195/108 07/27/2017   Hypothyroidism    Primary osteoarthritis of knee    Right   Past Surgical History:  Procedure Laterality Date   BREAST LUMPECTOMY WITH RADIOACTIVE SEED AND SENTINEL LYMPH NODE BIOPSY Left 03/27/2021   Procedure: LEFT BREAST  LUMPECTOMY WITH RADIOACTIVE SEED AND SENTINEL LYMPH NODE BIOPSY;  Surgeon: Erroll Luna, MD;  Location: [place];  Service: General;  Laterality: Left;   COLONOSCOPY N/A 01/16/2015   Procedure: COLONOSCOPY;  Surgeon: Daneil Dolin, MD;  [facility];  Service: Endoscopy;  Laterality: N/A;  8:30 AM   KNEE ARTHROPLASTY Left 2011      TOTAL KNEE ARTHROPLASTY Right 08/23/2017   Procedure: TOTAL KNEE ARTHROPLASTY;  Surgeon: Elsie Saas, MD;  Location: [place];  Service: Orthopedics;  Laterality: Right;     ALLERGIES:  Allergies  Allergen Reactions   Sulfa Antibiotics Other (See Comments)   Bee Venom Swelling and Other (See Comments)    Red and hot to site   Fish Allergy Hives   Ivp Dye [Iodinated Contrast Media] Other (See Comments)    MD advised not to use, due to fish allergies     CURRENT MEDICATIONS:  Outpatient Encounter Medications as of 02/09/2022  Medication Sig Note   acetaminophen (TYLENOL) 500 MG tablet Take 1,000-1,500 mg by mouth every 6 (six) hours as needed for moderate pain or headache.     amLODipine (NORVASC) 5 MG tablet Take 1 tablet (5 mg total) by mouth daily.    anastrozole (ARIMIDEX) 1 MG tablet Take 1 tablet (1 mg total) by mouth daily. Start June 29, 2021    Ascorbic Acid (VITAMIN C) 100 MG tablet Take 100 mg by mouth daily.    atorvastatin (LIPITOR) 20 MG tablet Take 1 tablet (20 mg total) by mouth daily.    calcium carbonate (OSCAL) 1500 (600 Ca) MG TABS tablet Take 600 mg by mouth 2 (two) times daily with a meal.    Cholecalciferol (VITAMIN D3 PO) Take 1 capsule by mouth daily. 12/09/2021: 1000 IU   diphenhydrAMINE (BENADRYL) 25 MG tablet Take 25 mg by mouth once.    EPINEPHrine 0.3 mg/0.3 mL IJ SOAJ injection Inject 0.3 mLs (0.3 mgttotal) into the muscle for 1 dose.    ibuprofen (ADVIL) 800 MG tablet Take 1 tablet (800 mg total) by mouth every 8 (eight) hours as needed.    levothyroxine (SYNTHROID) 150 MCG tablet  Take 1 tablet (150 mcg total) by mouth daily.    montelukast (SINGULAIR) 10 MG tablet Take 1 tablet (10 mg total) by mouth at bedtime.    Multiple Vitamin (MULTIVITAMIN) tablet Take 1 tablet by mouth daily.    triamcinolone (NASACORT) 55 MCG/ACT AERO nasal inhaler Place 2 sprays into the nose 2 (two) times daily.    zinc gluconate 50 MG tablet Take 50 mg by mouth daily.    No facility-administered encounter medications on file as of 02/09/2022.     ONCOLOGIC FAMILY HISTORY:  Family History  Problem Relation Age of Onset   Hypertension Mother    Lung disease Father      GENETIC COUNSELING/TESTING: Not at this time  SOCIAL HISTORY:  Social History   Socioeconomic History   Marital status: Single    Spouse name: Not on file   Number of children: 0   Years of education: Not on file   Highest education level: Not on file  Occupational History   Occupation: retired    Comment:  2017  Tobacco Use   Smoking status: Never   Smokeless tobacco: Never  Vaping Use   Vaping Use: Never used  Substance and Sexual Activity   Alcohol use: No   Drug use: No   Sexual activity: Not Currently    Comment: not in relationship currently  Other Topics Concern   Not on file  Social History Narrative   Not on file   Social Determinants of Health   Financial Resource Strain: Low Risk  (12/09/2021)   Overall Financial Resource Strain (CARDIA)    Difficulty of Paying Living Expenses: Not hard at all  Food Insecurity: No Food Insecurity (12/09/2021)   Hunger Vital Sign    Worried About Running Out of Food in the Last Year: Never true    Ran Out of Food in the Last Year: Never true  Transportation Needs: No Transportation Needs (12/09/2021)   PRAPARE - Hydrologist (Medical): No    Lack of Transportation (Non-Medical): No  Physical Activity: Insufficiently Active (12/09/2021)   Exercise Vital Sign    Days of Exercise per Week: 7 days    Minutes of Exercise per  Session: 20 min  Stress: No Stress Concern Present (12/09/2021)   [place]    Feeling of Stress : Not at all  Social Connections: Moderately Integrated (12/09/2021)   Social Connection and Isolation Panel [NHANES]    Frequency of Communication with Friends and Family: More than three times a week    Frequency of Social Gatherings with Friends and Family: More than three times a week    Attends Religious Services: More than 4 times per year    Active Member of [employer] or Organizations: Yes    Attends Archivist Meetings: More than 4 times per year    Marital Status: Never married  Intimate Partner Violence: Not At Risk (12/09/2021)   Humiliation, Afraid, Rape, and Kick questionnaire    Fear of Current or Ex-Partner: No    Emotionally Abused: No    Physically Abused: No    Sexually Abused: No     OBSERVATIONS/OBJECTIVE:  There were no vitals taken for this visit. Physical Exam Constitutional:      Appearance: Normal appearance.  Chest:     Comments: Bilateral breasts inspected. Post surgical changes left breast. No new masses or regional adenopathy Musculoskeletal:     Cervical back: Normal range of motion and neck supple. No rigidity.  Lymphadenopathy:     Cervical: No cervical adenopathy.  Neurological:     Mental Status: She is alert.    LABORATORY DATA:  None for this visit.  DIAGNOSTIC IMAGING:  None for this visit.      ASSESSMENT AND PLAN:    Ms.. Pound is a pleasant 71 y.o. female with Stage IA left breast invasive ductal carcinoma, ER+/PR+/HER2-, diagnosed in 02/2021, treated with lumpectomy, adjuvant radiation therapy, and anti-estrogen therapy with Anastrozole beginning in 05/2021.  She is tolerating anastrozole well except for intermittent hot flashes. Anticipate anastrozole daily January 2028. Baseline bone density in December 2022 with osteopenia, we have previously discussed  bisphosphonates if we can obtain dental clearance. She is here for follow-up No concerning findings on review of systems or physical exam. Recommend 150 minutes of exercise every week and SBE once a month Mammogram scheduled October 2023 She can return to clinic in 6 months   Total encounter time: 50mnutes in face to face visit time, chart review,  lab review, care coordination, and documentation of the encounter.    *Total Encounter Time as defined by the Centers for Medicare and Medicaid Services includes, in addition to the face-to-face time of a patient visit (documented in the note above) non-face-to-face time: obtaining and reviewing outside history, ordering and reviewing medications, tests or procedures, care coordination (communications with other health care professionals or caregivers) and documentation in the medical record.

## 2022-02-19 NOTE — Progress Notes (Signed)
[facility] Note  03/03/2022     CHIEF COMPLAINT Patient presents for Retina Follow Up    HISTORY OF PRESENT ILLNESS: Laurie Mckenzie is a 71 y.o. female who presents to the clinic today for:   HPI     Retina Follow Up   Patient presents with  Other.  This started 6 months ago.  I, the attending physician,  performed the HPI with the patient and updated documentation appropriately.        Comments   Patient here for 6 months retina follow up for ERM OU. Patient states vision is ok. Doing fine. At night when trying to read it is blurry. Holding off getting new glasses.      Last edited by Bernarda Caffey, MD on 03/03/2022  8:30 AM.    Pt states vision is doing well, it gets blurry at night when she is trying to read  Referring physician: Leticia Clas, [address]  [place], [place] [address]  HISTORICAL INFORMATION:   Selected notes from the MEDICAL RECORD NUMBER Referred by Dr. Rosana Hoes LEE: 06.28.22 Ocular Hx- ERM OU, VMT OS, Cataracts OU. VA OD 20/20, OS 20/30    CURRENT MEDICATIONS: No current outpatient medications on file. (Ophthalmic Drugs)   No current facility-administered medications for this visit. (Ophthalmic Drugs)   Current Outpatient Medications (Other)  Medication Sig   acetaminophen (TYLENOL) 500 MG tablet Take 1,000-1,500 mg by mouth every 6 (six) hours as needed for moderate pain or headache.    amLODipine (NORVASC) 5 MG tablet Take 1 tablet (5 mg total) by mouth daily.   anastrozole (ARIMIDEX) 1 MG tablet Take 1 tablet (1 mg total) by mouth daily. Start June 29, 2021   Ascorbic Acid (VITAMIN C) 100 MG tablet Take 100 mg by mouth daily.   atorvastatin (LIPITOR) 20 MG tablet Take 1 tablet (20 mg total) by mouth daily.   calcium carbonate (OSCAL) 1500 (600 Ca) MG TABS tablet Take 600 mg by mouth 2 (two) times daily with a meal.   Cholecalciferol (VITAMIN D3 PO) Take 1 capsule by mouth daily.   diphenhydrAMINE  (BENADRYL) 25 MG tablet Take 25 mg by mouth once.   EPINEPHrine 0.3 mg/0.3 mL IJ SOAJ injection Inject 0.3 mLs (0.3 mgttotal) into the muscle for 1 dose.   ibuprofen (ADVIL) 800 MG tablet Take 1 tablet (800 mg total) by mouth every 8 (eight) hours as needed.   levothyroxine (SYNTHROID) 150 MCG tablet Take 1 tablet (150 mcg total) by mouth daily.   montelukast (SINGULAIR) 10 MG tablet Take 1 tablet (10 mg total) by mouth at bedtime.   Multiple Vitamin (MULTIVITAMIN) tablet Take 1 tablet by mouth daily.   triamcinolone (NASACORT) 55 MCG/ACT AERO nasal inhaler Place 2 sprays into the nose 2 (two) times daily.   zinc gluconate 50 MG tablet Take 50 mg by mouth daily.   No current facility-administered medications for this visit. (Other)   REVIEW OF SYSTEMS: ROS   Positive for: Musculoskeletal, Endocrine, Cardiovascular, Eyes Negative for: Constitutional, Gastrointestinal, Neurological, Skin, Genitourinary, HENT, Respiratory, Psychiatric, Allergic/Imm, Heme/Lymph Last edited by Theodore Demark, COA on 03/03/2022  7:40 AM.      ALLERGIES Allergies  Allergen Reactions   Sulfa Antibiotics Other (See Comments)   Bee Venom Swelling and Other (See Comments)    Red and hot to site   Fish Allergy Hives   Ivp Dye [Iodinated Contrast Media] Other (See Comments)  MD advised not to use, due to fish allergies   PAST MEDICAL HISTORY Past Medical History:  Diagnosis Date   Cancer [facility])    Headache    Hypertension    186/106 today    195/108 07/27/2017   Hypothyroidism    Primary osteoarthritis of knee    Right   Past Surgical History:  Procedure Laterality Date   BREAST LUMPECTOMY WITH RADIOACTIVE SEED AND SENTINEL LYMPH NODE BIOPSY Left 03/27/2021   Procedure: LEFT BREAST LUMPECTOMY WITH RADIOACTIVE SEED AND SENTINEL LYMPH NODE BIOPSY;  Surgeon: Erroll Luna, MD;  Location: [place];  Service: General;  Laterality: Left;   COLONOSCOPY N/A 01/16/2015   Procedure:  COLONOSCOPY;  Surgeon: Daneil Dolin, MD;  [facility];  Service: Endoscopy;  Laterality: N/A;  8:30 AM   KNEE ARTHROPLASTY Left 2011   [place]   TOTAL KNEE ARTHROPLASTY Right 08/23/2017   Procedure: TOTAL KNEE ARTHROPLASTY;  Surgeon: Elsie Saas, MD;  Location: [place];  Service: Orthopedics;  Laterality: Right;   FAMILY HISTORY Family History  Problem Relation Age of Onset   Hypertension Mother    Lung disease Father    SOCIAL HISTORY Social History   Tobacco Use   Smoking status: Never   Smokeless tobacco: Never  Vaping Use   Vaping Use: Never used  Substance Use Topics   Alcohol use: No   Drug use: No       OPHTHALMIC EXAM: Base Eye Exam     Visual Acuity (Snellen - Linear)       Right Left   Dist cc 20/20 -1 20/30 -1   Dist ph cc  NI    Correction: Glasses         Tonometry (Tonopen, 7:37 AM)       Right Left   Pressure 18 16         Pupils       Dark Light Shape React APD   Right 4 3 Round Brisk None   Left 4 3 Round Brisk None         Visual Fields (Counting fingers)       Left Right    Full Full         Extraocular Movement       Right Left    Full, Ortho Full, Ortho         Neuro/Psych     Oriented x3: Yes   Mood/Affect: Normal         Dilation     Both eyes: 1.0% Mydriacyl, 2.5% Phenylephrine @ 7:37 AM           Slit Lamp and Fundus Exam     Slit Lamp Exam       Right Left   Lids/Lashes Dermatochalasis - upper lid, mild meibomian gland dysfunction Dermatochalasis - upper lid, mild MGD   Conjunctiva/Sclera White and quiet White and quiet   Cornea trace PEE, trace tear film debris trace PEE, trace tear film debris   Anterior Chamber deep, clear, narrow angles deep, clear, narrow angles   Iris Round and dilated, mild anterior bowing Round and dilated   Lens 2-3+ Nuclear sclerosis with brunescence, 2-3+ Cortical cataract 2-3+ Nuclear sclerosis with brunescence, 2-3+ Cortical cataract    Anterior Vitreous Vitreous syneresis, Posterior vitreous detachment Vitreous syneresis, Posterior vitreous detachment, vitreous condensations         Fundus Exam       Right Left   Disc Pink and Sharp,  mild PPA/PPP Pink and Sharp, temporal PPA   C/D Ratio 0.3 0.3   Macula Flat, Blunted foveal reflex, ERM with early striae, no heme or edema Flat, Blunted foveal reflex, ERM with striae and central thickening, no heme   Vessels attenuated, Tortuous mild attenuation, mild tortuosity   Periphery Attached; no heme; no RT/RD Attached; no heme           Refraction     Wearing Rx       Sphere Cylinder Axis Add   Right +2.25 +1.00 153 +2.00   Left +1.75 +0.50 178 +2.00            IMAGING AND PROCEDURES  Imaging and Procedures for 03/03/2022  OCT, Retina - OU - Both Eyes       Right Eye Quality was good. Central Foveal Thickness: 360. Progression has been stable. Findings include no IRF, no SRF, abnormal foveal contour, retinal drusen , epiretinal membrane, macular pucker (Mild interval thickening of ERM).   Left Eye Quality was good. Central Foveal Thickness: 485. Progression has been stable. Findings include no IRF, no SRF, abnormal foveal contour, epiretinal membrane, macular pucker (ERM with central thickening and mild pucker -- no significant change from prior).   Notes *Images captured and stored on drive  Diagnosis / Impression:  ERM with pucker OU (OS>OD) OD: Mild interval thickening of ERM OS: ERM with central thickening and mild pucker -- no significant change from prior  Clinical management:  See below  Abbreviations: NFP - Normal foveal profile. CME - cystoid macular edema. PED - pigment epithelial detachment. IRF - intraretinal fluid. SRF - subretinal fluid. EZ - ellipsoid zone. ERM - epiretinal membrane. ORA - outer retinal atrophy. ORT - outer retinal tubulation. SRHM - subretinal hyper-reflective material. IRHM - intraretinal hyper-reflective material             ASSESSMENT/PLAN:    ICD-10-CM   1. Epiretinal membrane (ERM) of both eyes  H35.373 OCT, Retina - OU - Both Eyes    2. Essential hypertension  I10     3. Hypertensive retinopathy of both eyes  H35.033     4. Combined forms of age-related cataract of both eyes  H25.813       1,2. Epiretinal membrane, both eyes (OS>OD)  - pt reports history of ERM dates back to 2018-2019 -- initially noted by Dr. Radford Pax - ERM w/ pucker OU (OS > OD) -- OS with loss of foveal contour and +central thickening - BCVA OD: 20/20, OS: 20/30 -- OS improved - OCT w/o IRF/SRF and ERMs stable from prior - mild blurring OU, no metamorphopsia - no indication for surgery at this time - monitor for now - okay to proceed with Mrx w/ Dr. Rosana Hoes - f/u 9-12 mos- DFE/OCT  3,4. Hypertensive retinopathy OU - discussed importance of tight BP control - monitor  5. Mixed Cataract OU - The symptoms of cataract, surgical options, and treatments and risks were discussed with patient. - discussed diagnosis and progression - discussed recommendation of having cataract surgery prior to any retina surgery, if possible - monitor  Ophthalmic Meds Ordered this visit:  No orders of the defined types were placed in this encounter.    Return for f/u 9-12 months, ERM OU, DFE, OCT.  There are no Patient Instructions on file for this visit.   This document serves as a record of services personally performed by Gardiner Sleeper, MD, PhD. It was created on their behalf by Renaldo Reel, COT an  ophthalmic technician. The creation of this record is the provider's dictation and/or activities during the visit.    Electronically signed by:  Renaldo Reel, COT  02/19/22 8:30 AM  This document serves as a record of services personally performed by Gardiner Sleeper, MD, PhD. It was created on their behalf by San Jetty. Owens Shark, OA an ophthalmic technician. The creation of this record is the provider's dictation and/or  activities during the visit.    Electronically signed by: San Jetty. Owens Shark, [place] 09.05.2023 8:30 AM  Gardiner Sleeper, M.D., Ph.D. Diseases & Surgery of the Retina and Vitreous [facility]  I have reviewed the above documentation for accuracy and completeness, and I agree with the above. Gardiner Sleeper, M.D., Ph.D. 03/03/22 8:32 AM   Abbreviations: M myopia (nearsighted); A astigmatism; H hyperopia (farsighted); P presbyopia; Mrx spectacle prescription;  CTL contact lenses; OD right eye; OS left eye; OU both eyes  XT exotropia; ET esotropia; PEK punctate epithelial keratitis; PEE punctate epithelial erosions; DES dry eye syndrome; MGD meibomian gland dysfunction; ATs artificial tears; PFAT's preservative free artificial tears; [place] nuclear sclerotic cataract; PSC posterior subcapsular cataract; ERM epi-retinal membrane; PVD posterior vitreous detachment; RD retinal detachment; DM diabetes mellitus; DR diabetic retinopathy; NPDR non-proliferative diabetic retinopathy; PDR proliferative diabetic retinopathy; CSME clinically significant macular edema; DME diabetic macular edema; dbh dot blot hemorrhages; CWS cotton wool spot; POAG primary open angle glaucoma; C/D cup-to-disc ratio; HVF humphrey visual field; GVF goldmann visual field; OCT optical coherence tomography; IOP intraocular pressure; BRVO Branch retinal vein occlusion; CRVO central retinal vein occlusion; CRAO central retinal artery occlusion; BRAO branch retinal artery occlusion; RT retinal tear; SB scleral buckle; PPV pars plana vitrectomy; VH Vitreous hemorrhage; PRP panretinal laser photocoagulation; IVK intravitreal kenalog; VMT vitreomacular traction; MH Macular hole;  NVD neovascularization of the disc; NVE neovascularization elsewhere; AREDS age related eye disease study; ARMD age related macular degeneration; POAG primary open angle glaucoma; EBMD epithelial/anterior basement membrane dystrophy; ACIOL anterior chamber  intraocular lens; IOL intraocular lens; PCIOL posterior chamber intraocular lens; Phaco/IOL phacoemulsification with intraocular lens placement; [place] photorefractive keratectomy; LASIK laser assisted in situ keratomileusis; HTN hypertension; DM diabetes mellitus; COPD chronic obstructive pulmonary disease

## 2022-03-03 ENCOUNTER — Encounter (INDEPENDENT_AMBULATORY_CARE_PROVIDER_SITE_OTHER): Payer: Self-pay | Admitting: Ophthalmology

## 2022-03-03 ENCOUNTER — Ambulatory Visit (INDEPENDENT_AMBULATORY_CARE_PROVIDER_SITE_OTHER): Payer: Medicare Other | Admitting: Ophthalmology

## 2022-03-03 DIAGNOSIS — I10 Essential (primary) hypertension: Secondary | ICD-10-CM | POA: Diagnosis not present

## 2022-03-03 DIAGNOSIS — H25813 Combined forms of age-related cataract, bilateral: Secondary | ICD-10-CM

## 2022-03-03 DIAGNOSIS — H35373 Puckering of macula, bilateral: Secondary | ICD-10-CM

## 2022-03-03 DIAGNOSIS — H35033 Hypertensive retinopathy, bilateral: Secondary | ICD-10-CM

## 2022-03-20 DIAGNOSIS — H35371 Puckering of macula, right eye: Secondary | ICD-10-CM | POA: Diagnosis not present

## 2022-03-20 DIAGNOSIS — H524 Presbyopia: Secondary | ICD-10-CM | POA: Diagnosis not present

## 2022-03-30 DIAGNOSIS — Z853 Personal history of malignant neoplasm of breast: Secondary | ICD-10-CM | POA: Diagnosis not present

## 2022-04-06 ENCOUNTER — Ambulatory Visit: Payer: Medicare Other | Attending: Surgery

## 2022-04-06 VITALS — Wt 219.1 lb

## 2022-04-06 DIAGNOSIS — N6321 Unspecified lump in the left breast, upper outer quadrant: Secondary | ICD-10-CM | POA: Diagnosis not present

## 2022-04-06 DIAGNOSIS — N6032 Fibrosclerosis of left breast: Secondary | ICD-10-CM | POA: Diagnosis not present

## 2022-04-06 DIAGNOSIS — Z483 Aftercare following surgery for neoplasm: Secondary | ICD-10-CM | POA: Insufficient documentation

## 2022-04-06 NOTE — Therapy (Signed)
OUTPATIENT PHYSICAL THERAPY SOZO SCREENING NOTE   Patient Name: Laurie Mckenzie MRN: [id number] DOB:1950-11-02, 71 y.o., female Today's Date: 04/06/2022  PCP: Dettinger, Fransisca Kaufmann, MD REFERRING PROVIDER: Erroll Luna, MD   PT End of Session - 04/06/22 [phone]     Visit Number 2   # unchanged due to screen only   PT Start Time 0821    PT Stop Time 0828    PT Time Calculation (min) 7 min    Activity Tolerance Patient tolerated treatment well    Behavior During Therapy [facility] for tasks assessed/performed             Past Medical History:  Diagnosis Date   Cancer ([place])    Headache    Hypertension    186/106 today    195/108 07/27/2017   Hypothyroidism    Primary osteoarthritis of knee    Right   Past Surgical History:  Procedure Laterality Date   BREAST LUMPECTOMY WITH RADIOACTIVE SEED AND SENTINEL LYMPH NODE BIOPSY Left 03/27/2021   Procedure: LEFT BREAST LUMPECTOMY WITH RADIOACTIVE SEED AND SENTINEL LYMPH NODE BIOPSY;  Surgeon: Erroll Luna, MD;  Location: [place];  Service: General;  Laterality: Left;   COLONOSCOPY N/A 01/16/2015   Procedure: COLONOSCOPY;  Surgeon: Daneil Dolin, MD;  [facility];  Service: Endoscopy;  Laterality: N/A;  8:30 AM   KNEE ARTHROPLASTY Left 2011   [place]   TOTAL KNEE ARTHROPLASTY Right 08/23/2017   Procedure: TOTAL KNEE ARTHROPLASTY;  Surgeon: Elsie Saas, MD;  Location: [place];  Service: Orthopedics;  Laterality: Right;   Patient Active Problem List   Diagnosis Date Noted   Malignant neoplasm of upper-outer quadrant of left breast in female, estrogen receptor positive ([place]) 03/17/2021   Primary localized osteoarthritis of right knee 08/23/2017   Hypothyroidism    Hypertension    Chronic allergic rhinitis 03/18/2015   Obesity (BMI 30-39.9) 02/20/2014   Hyperlipidemia 12/25/2013    REFERRING DIAG: left breast cancer at risk for lymphedema  THERAPY DIAG: Aftercare following surgery for  neoplasm  PERTINENT HISTORY: Patient was diagnosed on 02/13/2021 with 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 She underwent a left lumpectomy and sentinel node biopsy on 03/27/2021 with 2 negative nodes removed. It is ER/PR positive and HER2 negative with a Ki67 of 15%  PRECAUTIONS: left UE Lymphedema risk, None  SUBJECTIVE: Pt returns for her 3 month L-Dex screen.  "They found another lump and I'm having a biopsy today."   PAIN:  Are you having pain? No  SOZO SCREENING: Patient was assessed today using the SOZO machine to determine the lymphedema index score. This was compared to her baseline score. It was determined that she is within the recommended range when compared to her baseline and no further action is needed at this time. She will continue SOZO screenings. These are done every 3 months for 2 years post operatively followed by every 6 months for 2 years, and then annually.   L-DEX FLOWSHEETS - 04/06/22 0800       L-DEX LYMPHEDEMA SCREENING   Measurement Type Unilateral    L-DEX MEASUREMENT EXTREMITY Upper Extremity    POSITION  Standing    DOMINANT SIDE Right    At Risk Side Left    BASELINE SCORE (UNILATERAL) 2.5    L-DEX SCORE (UNILATERAL) 3.1    VALUE CHANGE (UNILAT) 0.6              Otelia Limes, PTA 04/06/2022, 8:29  AM

## 2022-05-23 DIAGNOSIS — R030 Elevated blood-pressure reading, without diagnosis of hypertension: Secondary | ICD-10-CM | POA: Diagnosis not present

## 2022-05-23 DIAGNOSIS — J018 Other acute sinusitis: Secondary | ICD-10-CM | POA: Diagnosis not present

## 2022-05-23 DIAGNOSIS — R059 Cough, unspecified: Secondary | ICD-10-CM | POA: Diagnosis not present

## 2022-06-17 ENCOUNTER — Other Ambulatory Visit: Payer: Self-pay | Admitting: Hematology and Oncology

## 2022-06-17 MED ORDER — ANASTROZOLE 1 MG PO TABS: 1.0000 mg | ORAL_TABLET | Freq: Every day | ORAL | 3 refills | Status: DC

## 2022-06-17 NOTE — Progress Notes (Signed)
Anastrozole pharmacy ordered.  Tuan Tippin

## 2022-06-25 ENCOUNTER — Encounter: Payer: Self-pay | Admitting: Emergency Medicine

## 2022-06-25 ENCOUNTER — Ambulatory Visit
Admission: EM | Admit: 2022-06-25 | Discharge: 2022-06-25 | Disposition: A | Discharge status: Home / Self Care | Payer: Medicare Other | Attending: Nurse Practitioner | Admitting: Nurse Practitioner

## 2022-06-25 DIAGNOSIS — J0100 Acute maxillary sinusitis, unspecified: Secondary | ICD-10-CM

## 2022-06-25 MED ORDER — AMOXICILLIN-POT CLAVULANATE 875-125 MG PO TABS: 1.0000 | ORAL_TABLET | Freq: Two times a day (BID) | ORAL | 0 refills | Status: DC

## 2022-06-25 MED ORDER — PROMETHAZINE-DM 6.25-15 MG/5ML PO SYRP: 5.0000 mL | ORAL_SOLUTION | Freq: Four times a day (QID) | ORAL | 0 refills | Status: DC | PRN

## 2022-06-25 NOTE — ED Triage Notes (Signed)
Nasal congestion, facial pain, cough x 1 week.  Has been taking tylenol, benadryl, nyquil with some relief during the day.

## 2022-06-25 NOTE — ED Provider Notes (Signed)
[facility]    CSN: [id number] Arrival date & time: 06/25/22  8676      History   Chief Complaint No chief complaint on file.   HPI Laurie Mckenzie is a 71 y.o. female.   The history is provided by the patient.   Patient presents with a several week history of sinus symptoms.  Patient complains of facial pain and pressure, nasal congestion, cough, postnasal drainage, and bilateral ear fullness and pressure.  Patient reports that she has been taking over-the-counter cough and cold medicines without relief.  Patient states she was seen several weeks ago and prescribed azithromycin for the same or similar symptoms.  She states her symptoms started improving, but they never resolved.  She states over the last 1 to 2 weeks, her symptoms have began to worsen.  She denies fever, chills, sore throat, runny nose, abdominal pain, nausea, vomiting, or diarrhea.  Past Medical History:  Diagnosis Date   Cancer [facility])    Headache    Hypertension    186/106 today    195/108 07/27/2017   Hypothyroidism    Primary osteoarthritis of knee    Right    Patient Active Problem List   Diagnosis Date Noted   Malignant neoplasm of upper-outer quadrant of left breast in female, estrogen receptor positive ([place]) 03/17/2021   Primary localized osteoarthritis of right knee 08/23/2017   Hypothyroidism    Hypertension    Chronic allergic rhinitis 03/18/2015   Obesity (BMI 30-39.9) 02/20/2014   Hyperlipidemia 12/25/2013    Past Surgical History:  Procedure Laterality Date   BREAST LUMPECTOMY WITH RADIOACTIVE SEED AND SENTINEL LYMPH NODE BIOPSY Left 03/27/2021   Procedure: LEFT BREAST LUMPECTOMY WITH RADIOACTIVE SEED AND SENTINEL LYMPH NODE BIOPSY;  Surgeon: Erroll Luna, MD;  Location: [place];  Service: General;  Laterality: Left;   COLONOSCOPY N/A 01/16/2015   Procedure: COLONOSCOPY;  Surgeon: Daneil Dolin, MD;  [facility];  Service: Endoscopy;  Laterality:  N/A;  8:30 AM   KNEE ARTHROPLASTY Left 2011   [place]   TOTAL KNEE ARTHROPLASTY Right 08/23/2017   Procedure: TOTAL KNEE ARTHROPLASTY;  Surgeon: Elsie Saas, MD;  Location: [place];  Service: Orthopedics;  Laterality: Right;    OB History   No obstetric history on file.      Home Medications    Prior to Admission medications   Medication Sig Start Date End Date Taking? Authorizing Provider  amoxicillin-clavulanate (AUGMENTIN) 875-125 MG tablet Take 1 tablet by mouth every 12 (twelve) hours. 06/25/22  Yes Danesha Kirchoff-Warren, Alda Lea, NP  anastrozole (ARIMIDEX) 1 MG tablet Take 1 tablet (1 mg total) by mouth daily. 06/17/22   Benay Pike, MD  promethazine-dextromethorphan (PROMETHAZINE-DM) 6.25-15 MG/5ML syrup Take 5 mLs by mouth 4 (four) times daily as needed for cough. 06/25/22  Yes Appollonia Klee-Warren, Alda Lea, NP  acetaminophen (TYLENOL) 500 MG tablet Take 1,000-1,500 mg by mouth every 6 (six) hours as needed for moderate pain or headache.     [provider]  amLODipine (NORVASC) 5 MG tablet Take 1 tablet (5 mg total) by mouth daily. 07/21/21   Dettinger, Fransisca Kaufmann, MD  Ascorbic Acid (VITAMIN C) 100 MG tablet Take 100 mg by mouth daily.    [provider]  atorvastatin (LIPITOR) 20 MG tablet Take 1 tablet (20 mg total) by mouth daily. 07/21/21   Dettinger, Fransisca Kaufmann, MD  calcium carbonate (OSCAL) 1500 (600 Ca) MG TABS tablet Take 600 mg by mouth 2 (two)  times daily with a meal.    [provider]  Cholecalciferol (VITAMIN D3 PO) Take 1 capsule by mouth daily.    [provider]  diphenhydrAMINE (BENADRYL) 25 MG tablet Take 25 mg by mouth once.    [provider]  EPINEPHrine 0.3 mg/0.3 mL IJ SOAJ injection Inject 0.3 mLs (0.3 mgttotal) into the muscle for 1 dose. 08/18/21   Dettinger, Fransisca Kaufmann, MD  ibuprofen (ADVIL) 800 MG tablet Take 1 tablet (800 mg total) by mouth every 8 (eight) hours as needed. 03/27/21   Cornett, Marcello Moores, MD   levothyroxine (SYNTHROID) 150 MCG tablet Take 1 tablet (150 mcg total) by mouth daily. 07/21/21   Dettinger, Fransisca Kaufmann, MD  montelukast (SINGULAIR) 10 MG tablet Take 1 tablet (10 mg total) by mouth at bedtime. 01/14/22   Dettinger, Fransisca Kaufmann, MD  Multiple Vitamin (MULTIVITAMIN) tablet Take 1 tablet by mouth daily.    [provider]  triamcinolone (NASACORT) 55 MCG/ACT AERO nasal inhaler Place 2 sprays into the nose 2 (two) times daily.    [provider]  zinc gluconate 50 MG tablet Take 50 mg by mouth daily.    [provider]    Family History Family History  Problem Relation Age of Onset   Hypertension Mother    Lung disease Father     Social History Social History   Tobacco Use   Smoking status: Never   Smokeless tobacco: Never  Vaping Use   Vaping Use: Never used  Substance Use Topics   Alcohol use: No   Drug use: No     Allergies   Sulfa antibiotics, Bee venom, Fish allergy, and Ivp dye [iodinated contrast media]   Review of Systems Review of Systems Per HPI  Physical Exam Triage Vital Signs ED Triage Vitals  Enc Vitals Group     BP 06/25/22 1047 (!) 190/98     Pulse Rate 06/25/22 1047 87     Resp 06/25/22 1047 18     Temp 06/25/22 1047 98.2 F (36.8 C)     Temp Source 06/25/22 1047 Oral     SpO2 06/25/22 1047 97 %     Weight --      Height --      Head Circumference --      Peak Flow --      Pain Score 06/25/22 1049 0     Pain Loc --      Pain Edu? --      Excl. in [place]? --    No data found.  Updated Vital Signs BP (!) 157/88 (BP Location: Right Arm)   Pulse 88   Temp 98.2 F (36.8 C) (Oral)   Resp 18   SpO2 97%   Visual Acuity Right Eye Distance:   Left Eye Distance:   Bilateral Distance:    Right Eye Near:   Left Eye Near:    Bilateral Near:     Physical Exam Vitals and nursing note reviewed.  Constitutional:      General: She is not in acute distress.    Appearance: Normal appearance.  HENT:     Head:  Normocephalic.     Right Ear: Ear canal and external ear normal. A middle ear effusion is present.     Left Ear: Ear canal and external ear normal. A middle ear effusion is present.     Nose: Congestion present. No rhinorrhea.     Right Turbinates: Enlarged and swollen.     Left Turbinates:  Enlarged and swollen.     Right Sinus: Maxillary sinus tenderness present. No frontal sinus tenderness.     Left Sinus: Maxillary sinus tenderness present. No frontal sinus tenderness.     Mouth/Throat:     Lips: Pink.     Mouth: Mucous membranes are moist.     Pharynx: Uvula midline. Posterior oropharyngeal erythema present. No pharyngeal swelling.  Eyes:     Extraocular Movements: Extraocular movements intact.     Conjunctiva/sclera: Conjunctivae normal.     Pupils: Pupils are equal, round, and reactive to light.  Cardiovascular:     Rate and Rhythm: Normal rate and regular rhythm.     Pulses: Normal pulses.     Heart sounds: Normal heart sounds.  Pulmonary:     Effort: Pulmonary effort is normal. No respiratory distress.     Breath sounds: Normal breath sounds. No stridor. No wheezing, rhonchi or rales.  Abdominal:     General: Bowel sounds are normal.     Palpations: Abdomen is soft.     Tenderness: There is no abdominal tenderness.  Musculoskeletal:     Cervical back: Normal range of motion.  Lymphadenopathy:     Cervical: No cervical adenopathy.  Skin:    General: Skin is warm and dry.  Neurological:     General: No focal deficit present.     Mental Status: She is alert and oriented to person, place, and time.  Psychiatric:        Mood and Affect: Mood normal.        Behavior: Behavior normal.      UC Treatments / Results  Labs (all labs ordered are listed, but only abnormal results are displayed) Labs Reviewed - No data to display  EKG   Radiology No results found.  Procedures Procedures (including critical care time)  Medications Ordered in UC Medications - No data  to display  Initial Impression / Assessment and Plan / UC Course  I have reviewed the triage vital signs and the nursing notes.  Pertinent labs & imaging results that were available during my care of the patient were reviewed by me and considered in my medical decision making (see chart for details).  The patient is well-appearing, she is in no acute distress, patient is hypertensive at 190/98 during triage, repeat BP was 157/98 at time of discharge.  Patient is scheduled to see her PCP over the next several weeks.  Presents consistent with acute maxillary sinusitis.  Given the duration of the patient's symptoms, and recent worsening, will start patient on Augmentin 875/2025 mg tablets and Promethazine DM for her cough.  Supportive care recommendations were provided to the patient to include continuing her current allergy regimen, and use of normal saline nasal spray to help with nasal congestion throughout the day.  Patient is scheduled to follow-up with her primary care physician within the next several weeks for recheck of her blood pressure.  She is asymptomatic at this time to include no chest pain, shortness of breath, difficulty breathing, or lower extremity swelling.  Patient was given strict return precautions.  Patient verbalizes understanding.  All questions were answered.  Patient is stable for discharge.  Final Clinical Impressions(s) / UC Diagnoses   Final diagnoses:  Acute maxillary sinusitis, recurrence not specified     Discharge Instructions      Take medication as directed. Continue your current allergy medication regimen. Increase fluids and get plenty of rest. May take over-the-counter ibuprofen or Tylenol as needed for pain,  fever, or general discomfort. Recommend normal saline nasal spray to help with nasal congestion throughout the day. For your cough, it may be helpful to use a humidifier at bedtime during sleep. If symptoms fail to improve with this treatment,  please follow-up with your primary care physician for further evaluation. Follow-up as needed.     ED Prescriptions     Medication Sig Dispense Auth. Provider   amoxicillin-clavulanate (AUGMENTIN) 875-125 MG tablet Take 1 tablet by mouth every 12 (twelve) hours. 14 tablet Zarayah Lanting-Warren, Alda Lea, NP   promethazine-dextromethorphan (PROMETHAZINE-DM) 6.25-15 MG/5ML syrup Take 5 mLs by mouth 4 (four) times daily as needed for cough. 118 mL Georgeann Brinkman-Warren, Alda Lea, NP      PDMP not reviewed this encounter.   Tish Men, NP 06/25/22 1140

## 2022-06-25 NOTE — Discharge Instructions (Addendum)
Take medication as directed. Continue your current allergy medication regimen. Increase fluids and get plenty of rest. May take over-the-counter ibuprofen or Tylenol as needed for pain, fever, or general discomfort. Recommend normal saline nasal spray to help with nasal congestion throughout the day. For your cough, it may be helpful to use a humidifier at bedtime during sleep. If symptoms fail to improve with this treatment, please follow-up with your primary care physician for further evaluation. Follow-up as needed.

## 2022-06-26 ENCOUNTER — Telehealth: Payer: Medicare Other | Admitting: Family Medicine

## 2022-07-08 ENCOUNTER — Encounter: Payer: Self-pay | Admitting: Family Medicine

## 2022-07-10 ENCOUNTER — Other Ambulatory Visit: Payer: Self-pay | Admitting: Family Medicine

## 2022-07-10 DIAGNOSIS — E782 Mixed hyperlipidemia: Secondary | ICD-10-CM

## 2022-07-10 DIAGNOSIS — I10 Essential (primary) hypertension: Secondary | ICD-10-CM

## 2022-07-13 ENCOUNTER — Telehealth: Payer: Self-pay | Admitting: Family Medicine

## 2022-07-13 DIAGNOSIS — I10 Essential (primary) hypertension: Secondary | ICD-10-CM

## 2022-07-13 DIAGNOSIS — E782 Mixed hyperlipidemia: Secondary | ICD-10-CM

## 2022-07-13 DIAGNOSIS — E039 Hypothyroidism, unspecified: Secondary | ICD-10-CM

## 2022-07-13 DIAGNOSIS — E559 Vitamin D deficiency, unspecified: Secondary | ICD-10-CM

## 2022-07-13 NOTE — Telephone Encounter (Signed)
Patient would like to come on 1/17 to have lab work before her appt on 1/19 - please add and call back

## 2022-07-13 NOTE — Telephone Encounter (Signed)
Future lab orders placed. Pt aware.

## 2022-07-15 ENCOUNTER — Other Ambulatory Visit: Payer: Medicare Other

## 2022-07-15 DIAGNOSIS — I10 Essential (primary) hypertension: Secondary | ICD-10-CM

## 2022-07-15 DIAGNOSIS — E039 Hypothyroidism, unspecified: Secondary | ICD-10-CM | POA: Diagnosis not present

## 2022-07-15 DIAGNOSIS — E782 Mixed hyperlipidemia: Secondary | ICD-10-CM

## 2022-07-15 DIAGNOSIS — E559 Vitamin D deficiency, unspecified: Secondary | ICD-10-CM | POA: Diagnosis not present

## 2022-07-15 LAB — LIPID PANEL

## 2022-07-16 LAB — LIPID PANEL
Chol/HDL Ratio: 3.8 ratio (ref 0.0–4.4)
Cholesterol, Total: 166 mg/dL (ref 100–199)
HDL: 44 mg/dL (ref >39)
LDL Chol Calc (NIH): 71 mg/dL (ref 0–99)
Triglycerides: 322 mg/dL — ABNORMAL HIGH (ref 0–149)
VLDL Cholesterol Cal: 51 mg/dL — ABNORMAL HIGH (ref 5–40)

## 2022-07-16 LAB — CMP14+EGFR
ALT: 36 IU/L — ABNORMAL HIGH (ref 0–32)
AST: 28 IU/L (ref 0–40)
Albumin/Globulin Ratio: 1.9 (ref 1.2–2.2)
Albumin: 4.7 g/dL (ref 3.8–4.8)
Alkaline Phosphatase: 115 IU/L (ref 44–121)
BUN/Creatinine Ratio: 25 (ref 12–28)
BUN: 16 mg/dL (ref 8–27)
Bilirubin Total: 0.2 mg/dL (ref 0.0–1.2)
CO2: 23 mmol/L (ref 20–29)
Calcium: 10 mg/dL (ref 8.7–10.3)
Chloride: 99 mmol/L (ref 96–106)
Creatinine, Ser: 0.65 mg/dL (ref 0.57–1.00)
Globulin, Total: 2.5 g/dL (ref 1.5–4.5)
Glucose: 117 mg/dL — ABNORMAL HIGH (ref 70–99)
Potassium: 4.7 mmol/L (ref 3.5–5.2)
Sodium: 140 mmol/L (ref 134–144)
Total Protein: 7.2 g/dL (ref 6.0–8.5)
eGFR: 94 mL/min/{1.73_m2} (ref >59)

## 2022-07-16 LAB — CBC WITH DIFFERENTIAL/PLATELET
Basophils Absolute: 0.1 10*3/uL (ref 0.0–0.2)
Basos: 1 %
EOS (ABSOLUTE): 0.2 10*3/uL (ref 0.0–0.4)
Eos: 2 %
Hematocrit: 44.1 % (ref 34.0–46.6)
Hemoglobin: 14.7 g/dL (ref 11.1–15.9)
Immature Grans (Abs): 0.1 10*3/uL (ref 0.0–0.1)
Immature Granulocytes: 2 %
Lymphocytes Absolute: 1.9 10*3/uL (ref 0.7–3.1)
Lymphs: 23 %
MCH: 30.9 pg (ref 26.6–33.0)
MCHC: 33.3 g/dL (ref 31.5–35.7)
MCV: 93 fL (ref 79–97)
Monocytes Absolute: 0.7 10*3/uL (ref 0.1–0.9)
Monocytes: 8 %
Neutrophils Absolute: 5.3 10*3/uL (ref 1.4–7.0)
Neutrophils: 64 %
Platelets: 259 10*3/uL (ref 150–450)
RBC: 4.76 x10E6/uL (ref 3.77–5.28)
RDW: 12.6 % (ref 11.7–15.4)
WBC: 8.1 10*3/uL (ref 3.4–10.8)

## 2022-07-16 LAB — VITAMIN D 25 HYDROXY (VIT D DEFICIENCY, FRACTURES): Vit D, 25-Hydroxy: 40.9 ng/mL (ref 30.0–100.0)

## 2022-07-16 LAB — TSH: TSH: 2.55 u[IU]/mL (ref 0.450–4.500)

## 2022-07-17 ENCOUNTER — Telehealth: Payer: Self-pay

## 2022-07-17 ENCOUNTER — Ambulatory Visit (INDEPENDENT_AMBULATORY_CARE_PROVIDER_SITE_OTHER): Payer: Medicare Other | Admitting: Family Medicine

## 2022-07-17 ENCOUNTER — Encounter: Payer: Self-pay | Admitting: Family Medicine

## 2022-07-17 VITALS — BP 161/88 | HR 95 | Temp 98.0°F | Ht 64.0 in | Wt 223.0 lb

## 2022-07-17 DIAGNOSIS — E039 Hypothyroidism, unspecified: Secondary | ICD-10-CM

## 2022-07-17 DIAGNOSIS — E781 Pure hyperglyceridemia: Secondary | ICD-10-CM | POA: Diagnosis not present

## 2022-07-17 DIAGNOSIS — I10 Essential (primary) hypertension: Secondary | ICD-10-CM | POA: Diagnosis not present

## 2022-07-17 DIAGNOSIS — E782 Mixed hyperlipidemia: Secondary | ICD-10-CM

## 2022-07-17 MED ORDER — LEVOTHYROXINE SODIUM 150 MCG PO TABS: 150.0000 ug | ORAL_TABLET | Freq: Every day | ORAL | 3 refills | Status: DC

## 2022-07-17 MED ORDER — ATORVASTATIN CALCIUM 20 MG PO TABS: 20.0000 mg | ORAL_TABLET | Freq: Every day | ORAL | 3 refills | Status: DC

## 2022-07-17 MED ORDER — AMLODIPINE BESYLATE 5 MG PO TABS: 5.0000 mg | ORAL_TABLET | Freq: Every day | ORAL | 3 refills | Status: DC

## 2022-07-17 NOTE — Telephone Encounter (Signed)
error 

## 2022-07-17 NOTE — Progress Notes (Signed)
BP (!) 161/88   Pulse 95   Temp 98 F (36.7 C)   Ht '5\' 4"'$  (1.626 m)   Wt 223 lb (101.2 kg)   SpO2 97%   BMI 38.28 kg/m    Subjective:   Patient ID: Laurie Mckenzie, female    DOB: 1951/05/15, 72 y.o.   MRN: [id number]  HPI: Laurie Mckenzie is a 72 y.o. female presenting on 07/17/2022 for Medical Management of Chronic Issues, Hypertension, and Hypothyroidism   HPI Hypothyroidism recheck Patient is coming in for thyroid recheck today as well. They deny any issues with hair changes or heat or cold problems or diarrhea or constipation. They deny any chest pain or palpitations. They are currently on levothyroxine 150 micrograms   Hypertension Patient is currently on amlodipine, and their blood pressure today is 154/91, she will keep a close eye on the next couple weeks and let us know if it is running that high at home.. Patient denies any lightheadedness or dizziness. Patient denies headaches, blurred vision, chest pains, shortness of breath, or weakness. Denies any side effects from medication and is content with current medication.   Hyperlipidemia and hypertriglyceridemia Patient is coming in for recheck of his hyperlipidemia. The patient is currently taking atorvastatin. They deny any issues with myalgias or history of liver damage from it. They deny any focal numbness or weakness or chest pain.   Relevant past medical, surgical, family and social history reviewed and updated as indicated. Interim medical history since our last visit reviewed. Allergies and medications reviewed and updated.  Review of Systems  Constitutional:  Negative for chills and fever.  Eyes:  Negative for visual disturbance.  Respiratory:  Negative for chest tightness and shortness of breath.   Cardiovascular:  Negative for chest pain and leg swelling.  Musculoskeletal:  Negative for back pain and gait problem.  Skin:  Negative for rash.  Neurological:  Negative for dizziness, light-headedness and headaches.   Psychiatric/Behavioral:  Negative for agitation and behavioral problems.   All other systems reviewed and are negative.   Per HPI unless specifically indicated above   Allergies as of 07/17/2022       Reactions   Sulfa Antibiotics Other (See Comments)   Bee Venom Swelling, Other (See Comments)   Red and hot to site   Fish Allergy Hives   Ivp Dye [iodinated Contrast Media] Other (See Comments)   MD advised not to use, due to fish allergies        Medication List        Accurate as of July 17, 2022  8:37 AM. If you have any questions, ask your nurse or doctor.          STOP taking these medications    amoxicillin-clavulanate 875-125 MG tablet Commonly known as: AUGMENTIN Stopped by: Fransisca Kaufmann Chrisanne Loose, MD       TAKE these medications    acetaminophen 500 MG tablet Commonly known as: TYLENOL Take 1,000-1,500 mg by mouth every 6 (six) hours as needed for moderate pain or headache.   amLODipine 5 MG tablet Commonly known as: NORVASC Take 1 tablet (5 mg total) by mouth daily.   anastrozole 1 MG tablet Commonly known as: ARIMIDEX Take 1 tablet (1 mg total) by mouth daily.   atorvastatin 20 MG tablet Commonly known as: LIPITOR Take 1 tablet (20 mg total) by mouth daily.   calcium carbonate 1500 (600 Ca) MG Tabs tablet Commonly known as: OSCAL Take 600 mg by mouth 2 (  two) times daily with a meal.   diphenhydrAMINE 25 MG tablet Commonly known as: BENADRYL Take 25 mg by mouth once.   EPINEPHrine 0.3 mg/0.3 mL Soaj injection Commonly known as: EPI-PEN Inject 0.3 mLs (0.3 mgttotal) into the muscle for 1 dose.   ibuprofen 800 MG tablet Commonly known as: ADVIL Take 1 tablet (800 mg total) by mouth every 8 (eight) hours as needed.   levothyroxine 150 MCG tablet Commonly known as: SYNTHROID Take 1 tablet (150 mcg total) by mouth daily.   montelukast 10 MG tablet Commonly known as: SINGULAIR Take 1 tablet (10 mg total) by mouth at bedtime.    multivitamin tablet Take 1 tablet by mouth daily.   promethazine-dextromethorphan 6.25-15 MG/5ML syrup Commonly known as: PROMETHAZINE-DM Take 5 mLs by mouth 4 (four) times daily as needed for cough.   triamcinolone 55 MCG/ACT Aero nasal inhaler Commonly known as: NASACORT Place 2 sprays into the nose 2 (two) times daily.   vitamin C 100 MG tablet Take 100 mg by mouth daily.   VITAMIN D3 PO Take 1 capsule by mouth daily.   zinc gluconate 50 MG tablet Take 50 mg by mouth daily.         Objective:   BP (!) 161/88   Pulse 95   Temp 98 F (36.7 C)   Ht '5\' 4"'$  (1.626 m)   Wt 223 lb (101.2 kg)   SpO2 97%   BMI 38.28 kg/m   Wt Readings from Last 3 Encounters:  07/17/22 223 lb (101.2 kg)  04/06/22 219 lb 2 oz (99.4 kg)  02/09/22 220 lb 4 oz (99.9 kg)    Physical Exam Vitals and nursing note reviewed.  Constitutional:      General: She is not in acute distress.    Appearance: She is well-developed. She is not diaphoretic.  Eyes:     Conjunctiva/sclera: Conjunctivae normal.  Cardiovascular:     Rate and Rhythm: Normal rate and regular rhythm.     Heart sounds: Normal heart sounds. No murmur heard. Pulmonary:     Effort: Pulmonary effort is normal. No respiratory distress.     Breath sounds: Normal breath sounds. No wheezing.  Musculoskeletal:        General: No swelling or tenderness. Normal range of motion.  Skin:    General: Skin is warm and dry.     Findings: No rash.  Neurological:     Mental Status: She is alert and oriented to person, place, and time.     Coordination: Coordination normal.  Psychiatric:        Behavior: Behavior normal.     Results for orders placed or performed in visit on 07/15/22  VITAMIN D 25 Hydroxy (Vit-D Deficiency, Fractures)  Result Value Ref Range   Vit D, 25-Hydroxy 40.9 30.0 - 100.0 ng/mL  TSH  Result Value Ref Range   TSH 2.550 0.450 - 4.500 uIU/mL  Lipid panel  Result Value Ref Range   Cholesterol, Total 166 100 -  199 mg/dL   Triglycerides 322 (H) 0 - 149 mg/dL   HDL 44 >39 mg/dL   VLDL Cholesterol Cal 51 (H) 5 - 40 mg/dL   LDL Chol Calc (NIH) 71 0 - 99 mg/dL   Chol/HDL Ratio 3.8 0.0 - 4.4 ratio  CMP14+EGFR  Result Value Ref Range   Glucose 117 (H) 70 - 99 mg/dL   BUN 16 8 - 27 mg/dL   Creatinine, Ser 0.65 0.57 - 1.00 mg/dL   eGFR 94 >59  mL/min/1.73   BUN/Creatinine Ratio 25 12 - 28   Sodium 140 134 - 144 mmol/L   Potassium 4.7 3.5 - 5.2 mmol/L   Chloride 99 96 - 106 mmol/L   CO2 23 20 - 29 mmol/L   Calcium 10.0 8.7 - 10.3 mg/dL   Total Protein 7.2 6.0 - 8.5 g/dL   Albumin 4.7 3.8 - 4.8 g/dL   Globulin, Total 2.5 1.5 - 4.5 g/dL   Albumin/Globulin Ratio 1.9 1.2 - 2.2   Bilirubin Total 0.2 0.0 - 1.2 mg/dL   Alkaline Phosphatase 115 44 - 121 IU/L   AST 28 0 - 40 IU/L   ALT 36 (H) 0 - 32 IU/L  CBC with Differential/Platelet  Result Value Ref Range   WBC 8.1 3.4 - 10.8 x10E3/uL   RBC 4.76 3.77 - 5.28 x10E6/uL   Hemoglobin 14.7 11.1 - 15.9 g/dL   Hematocrit 44.1 34.0 - 46.6 %   MCV 93 79 - 97 fL   MCH 30.9 26.6 - 33.0 pg   MCHC 33.3 31.5 - 35.7 g/dL   RDW 12.6 11.7 - 15.4 %   Platelets 259 150 - 450 x10E3/uL   Neutrophils 64 Not Estab. %   Lymphs 23 Not Estab. %   Monocytes 8 Not Estab. %   Eos 2 Not Estab. %   Basos 1 Not Estab. %   Neutrophils Absolute 5.3 1.4 - 7.0 x10E3/uL   Lymphocytes Absolute 1.9 0.7 - 3.1 x10E3/uL   Monocytes Absolute 0.7 0.1 - 0.9 x10E3/uL   EOS (ABSOLUTE) 0.2 0.0 - 0.4 x10E3/uL   Basophils Absolute 0.1 0.0 - 0.2 x10E3/uL   Immature Granulocytes 2 Not Estab. %   Immature Grans (Abs) 0.1 0.0 - 0.1 x10E3/uL    Assessment & Plan:   Problem List Items Addressed This Visit       Cardiovascular and Mediastinum   Hypertension   Relevant Medications   amLODipine (NORVASC) 5 MG tablet   atorvastatin (LIPITOR) 20 MG tablet     Endocrine   Hypothyroidism   Relevant Medications   levothyroxine (SYNTHROID) 150 MCG tablet     Other   Hyperlipidemia    Relevant Medications   amLODipine (NORVASC) 5 MG tablet   atorvastatin (LIPITOR) 20 MG tablet   Hypertriglyceridemia - Primary   Relevant Medications   amLODipine (NORVASC) 5 MG tablet   atorvastatin (LIPITOR) 20 MG tablet    Patient will check her blood pressure for the next 2 weeks at home let us know some results.  Otherwise patient blood work showed high triglycerides but otherwise normal.  She wants to try diet a little bit longer and get back to exercising.  May consider fenofibrate in the future. Follow up plan: Return in about 6 months (around 01/15/2023), or if symptoms worsen or fail to improve, for Hypertension and hypothyroidism.  Counseling provided for all of the vaccine components No orders of the defined types were placed in this encounter.   Caryl Pina, MD [facility] 07/17/2022, 8:37 AM

## 2022-07-20 ENCOUNTER — Ambulatory Visit: Payer: Medicare Other | Attending: Surgery

## 2022-07-20 VITALS — Wt 223.2 lb

## 2022-07-20 DIAGNOSIS — Z483 Aftercare following surgery for neoplasm: Secondary | ICD-10-CM | POA: Insufficient documentation

## 2022-07-20 NOTE — Therapy (Signed)
  OUTPATIENT PHYSICAL THERAPY SOZO SCREENING NOTE   Patient Name: Laurie Mckenzie MRN: [id number] DOB:[birth year], 72 y.o., female Today's Date: 07/20/2022  PCP: Dettinger, Fransisca Kaufmann, MD REFERRING PROVIDER: Erroll Luna, MD   PT End of Session - 07/20/22 [phone]     Visit Number 2   # unchanged due to screen only   PT Start Time 0833    PT Stop Time 0837    PT Time Calculation (min) 4 min    Activity Tolerance Patient tolerated treatment well    Behavior During Therapy [facility] for tasks assessed/performed             Past Medical History:  Diagnosis Date   Cancer ([place])    Headache    Hypertension    186/106 today    195/108 07/27/2017   Hypothyroidism    Primary osteoarthritis of knee    Right   Past Surgical History:  Procedure Laterality Date   BREAST LUMPECTOMY WITH RADIOACTIVE SEED AND SENTINEL LYMPH NODE BIOPSY Left 03/27/2021   Procedure: LEFT BREAST LUMPECTOMY WITH RADIOACTIVE SEED AND SENTINEL LYMPH NODE BIOPSY;  Surgeon: Erroll Luna, MD;  Location: [place];  Service: General;  Laterality: Left;   COLONOSCOPY N/A 01/16/2015   Procedure: COLONOSCOPY;  Surgeon: Daneil Dolin, MD;  [facility];  Service: Endoscopy;  Laterality: N/A;  8:30 AM   KNEE ARTHROPLASTY Left 2011   [place]   TOTAL KNEE ARTHROPLASTY Right 08/23/2017   Procedure: TOTAL KNEE ARTHROPLASTY;  Surgeon: Elsie Saas, MD;  Location: [place];  Service: Orthopedics;  Laterality: Right;   Patient Active Problem List   Diagnosis Date Noted   Hypertriglyceridemia 07/17/2022   Malignant neoplasm of upper-outer quadrant of left breast in female, estrogen receptor positive ([place]) 03/17/2021   Primary localized osteoarthritis of right knee 08/23/2017   Hypothyroidism    Hypertension    Chronic allergic rhinitis 03/18/2015   Obesity (BMI 30-39.9) 02/20/2014   Hyperlipidemia 12/25/2013    REFERRING DIAG: left breast cancer at risk for lymphedema  THERAPY DIAG: Aftercare  following surgery for neoplasm  PERTINENT HISTORY: Patient was diagnosed on 02/13/2021 with 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 She underwent a left lumpectomy and sentinel node biopsy on 03/27/2021 with 2 negative nodes removed. It is ER/PR positive and HER2 negative with a Ki67 of 15%  PRECAUTIONS: left UE Lymphedema risk, None  SUBJECTIVE: Pt returns for her 3 month L-Dex screen.    PAIN:  Are you having pain? No  SOZO SCREENING: Patient was assessed today using the SOZO machine to determine the lymphedema index score. This was compared to her baseline score. It was determined that she is within the recommended range when compared to her baseline and no further action is needed at this time. She will continue SOZO screenings. These are done every 3 months for 2 years post operatively followed by every 6 months for 2 years, and then annually.   L-DEX FLOWSHEETS - 07/20/22 0800       L-DEX LYMPHEDEMA SCREENING   Measurement Type Unilateral    L-DEX MEASUREMENT EXTREMITY Upper Extremity    POSITION  Standing    DOMINANT SIDE Right    At Risk Side Left    BASELINE SCORE (UNILATERAL) 2.5    L-DEX SCORE (UNILATERAL) 7.2    VALUE CHANGE (UNILAT) 4.7              Otelia Limes, PTA 07/20/2022, 8:36 AM

## 2022-08-12 ENCOUNTER — Inpatient Hospital Stay: Payer: Medicare Other | Attending: Hematology and Oncology | Admitting: Hematology and Oncology

## 2022-08-12 ENCOUNTER — Encounter: Payer: Self-pay | Admitting: Hematology and Oncology

## 2022-08-12 ENCOUNTER — Other Ambulatory Visit: Payer: Self-pay

## 2022-08-12 VITALS — BP 174/82 | HR 92 | Temp 97.7°F | Resp 16 | Ht 64.0 in | Wt 226.7 lb

## 2022-08-12 DIAGNOSIS — Z923 Personal history of irradiation: Secondary | ICD-10-CM | POA: Diagnosis not present

## 2022-08-12 DIAGNOSIS — Z170 Estrogen receptor positive status [ER+]: Secondary | ICD-10-CM | POA: Insufficient documentation

## 2022-08-12 DIAGNOSIS — Z79899 Other long term (current) drug therapy: Secondary | ICD-10-CM | POA: Diagnosis not present

## 2022-08-12 DIAGNOSIS — Z79811 Long term (current) use of aromatase inhibitors: Secondary | ICD-10-CM | POA: Insufficient documentation

## 2022-08-12 DIAGNOSIS — C50412 Malignant neoplasm of upper-outer quadrant of left female breast: Secondary | ICD-10-CM | POA: Insufficient documentation

## 2022-08-12 DIAGNOSIS — M8580 Other specified disorders of bone density and structure, unspecified site: Secondary | ICD-10-CM | POA: Diagnosis not present

## 2022-08-12 NOTE — Progress Notes (Signed)
ONCOLOGIC HISTORY:  Oncology History  Malignant neoplasm of upper-outer quadrant of left breast in female, estrogen receptor positive ([place])  03/12/2021 Initial Diagnosis   status post left breast upper outer quadrant biopsy 03/12/2021 for a clinical T1c N0, stage IA invasive ductal carcinoma, grade 2, estrogen and progesterone receptor positive, HER2 not amplified (FISH results pending) with an MIB-1 of 30%   03/19/2021 Cancer Staging   Staging form: Breast, AJCC 8th Edition - Clinical stage from 03/19/2021: Stage IA (cT1c, cN0, cM0, G2, ER+, PR+, HER2-) - Signed by Chauncey Cruel, MD on 03/19/2021 Stage prefix: Initial diagnosis Histologic grading system: 3 grade system Laterality: Left Staged by: Pathologist and managing physician Stage used in treatment planning: Yes National guidelines used in treatment planning: Yes Type of national guideline used in treatment planning: NCCN   03/27/2021 Cancer Staging   Staging form: Breast, AJCC 8th Edition - Pathologic stage from 03/27/2021: Stage IA (pT1b, pN0, cM0, G3, ER+, PR+, HER2-) - Signed by Gardenia Phlegm, NP on 07/19/2021 Histologic grading system: 3 grade system   03/27/2021 Surgery   status post left lumpectomy and sentinel lymph node sampling 03/27/2021 for a pT1b pN0, stage IA invasive ductal carcinoma, grade 3, with negative margins.             (a) 2 left axillary lymph nodes were removed     03/27/2021 Oncotype testing   Oncotype score of 24 predicts a risk of recurrence outside the breast over the next 9 years of 10% if the patient's only systemic therapy is antiestrogens for 5 years.  It also predicts minimal benefit from adjuvant chemotherapy   05/01/2021 - 06/03/2021 Radiation Therapy   05/01/2021 through 06/03/2021 Site Technique Total Dose (Gy) Dose per Fx (Gy) Completed Fx Beam Energies  Breast, Left: Breast_Lt 3D 42.56/42.56 2.66 16/16 10X  Breast, Left: Breast_Lt_Bst 3D 8/8 2 4/4 6X, 10X    05/29/2021 -   Anti-estrogen oral therapy   Anastrozole daily     INTERVAL HISTORY:   She is taking anastrozole daily with good tolerance.   She has been noting intermittent hot flashes. No other major adverse effects. She denies any changes in her breast Since her last visit, she had mammogram, [place] and biopsy, pathology benign. She has not been going back to the [facility] on a regular basis. Rest of the pertinent 10 point ROS reviewed and negative.  REVIEW OF SYSTEMS:  Review of Systems  Constitutional:  Negative for appetite change, chills, fatigue, fever and unexpected weight change.  HENT:   Negative for hearing loss, lump/mass and trouble swallowing.   Eyes:  Negative for eye problems and icterus.  Respiratory:  Negative for chest tightness, cough and shortness of breath.   Cardiovascular:  Negative for chest pain, leg swelling and palpitations.  Gastrointestinal:  Negative for abdominal distention, abdominal pain, constipation, diarrhea, nausea and vomiting.  Endocrine: Positive for hot flashes.  Genitourinary:  Negative for difficulty urinating.   Musculoskeletal:  Negative for arthralgias.  Skin:  Negative for itching and rash.  Neurological:  Negative for dizziness, extremity weakness, headaches and numbness.  Hematological:  Negative for adenopathy. Does not bruise/bleed easily.  Psychiatric/Behavioral:  Negative for depression. The patient is not nervous/anxious.    Breast: Denies any new nodularity, masses, tenderness, nipple changes, or nipple discharge.   ONCOLOGY TREATMENT TEAM:  1. Surgeon:  Dr. Brantley Stage at [facility] 2. Medical Oncologist: Dr. Chryl Heck  3. Radiation Oncologist: Dr. Lisbeth Renshaw    PAST MEDICAL/SURGICAL HISTORY:  Past  Medical History:  Diagnosis Date   Cancer [facility])    Headache    Hypertension    186/106 today    195/108 07/27/2017   Hypothyroidism    Primary osteoarthritis of knee    Right   Past Surgical History:  Procedure Laterality Date   BREAST  LUMPECTOMY WITH RADIOACTIVE SEED AND SENTINEL LYMPH NODE BIOPSY Left 03/27/2021   Procedure: LEFT BREAST LUMPECTOMY WITH RADIOACTIVE SEED AND SENTINEL LYMPH NODE BIOPSY;  Surgeon: Erroll Luna, MD;  Location: [place];  Service: General;  Laterality: Left;   COLONOSCOPY N/A 01/16/2015   Procedure: COLONOSCOPY;  Surgeon: Daneil Dolin, MD;  [facility];  Service: Endoscopy;  Laterality: N/A;  8:30 AM   KNEE ARTHROPLASTY Left 2011   [place]   TOTAL KNEE ARTHROPLASTY Right 08/23/2017   Procedure: TOTAL KNEE ARTHROPLASTY;  Surgeon: Elsie Saas, MD;  Location: [place];  Service: Orthopedics;  Laterality: Right;     ALLERGIES:  Allergies  Allergen Reactions   Sulfa Antibiotics Other (See Comments)   Bee Venom Swelling and Other (See Comments)    Red and hot to site   Fish Allergy Hives   Ivp Dye [Iodinated Contrast Media] Other (See Comments)    MD advised not to use, due to fish allergies     CURRENT MEDICATIONS:  Outpatient Encounter Medications as of 08/12/2022  Medication Sig Note   acetaminophen (TYLENOL) 500 MG tablet Take 1,000-1,500 mg by mouth every 6 (six) hours as needed for moderate pain or headache.     amLODipine (NORVASC) 5 MG tablet Take 1 tablet (5 mg total) by mouth daily.    anastrozole (ARIMIDEX) 1 MG tablet Take 1 tablet (1 mg total) by mouth daily.    Ascorbic Acid (VITAMIN C) 100 MG tablet Take 100 mg by mouth daily.    atorvastatin (LIPITOR) 20 MG tablet Take 1 tablet (20 mg total) by mouth daily.    calcium carbonate (OSCAL) 1500 (600 Ca) MG TABS tablet Take 600 mg by mouth 2 (two) times daily with a meal.    Cholecalciferol (VITAMIN D3 PO) Take 1 capsule by mouth daily. 12/09/2021: 1000 IU   diphenhydrAMINE (BENADRYL) 25 MG tablet Take 25 mg by mouth once.    EPINEPHrine 0.3 mg/0.3 mL IJ SOAJ injection Inject 0.3 mLs (0.3 mgttotal) into the muscle for 1 dose.    ibuprofen (ADVIL) 800 MG tablet Take 1 tablet (800 mg total) by  mouth every 8 (eight) hours as needed.    levothyroxine (SYNTHROID) 150 MCG tablet Take 1 tablet (150 mcg total) by mouth daily.    montelukast (SINGULAIR) 10 MG tablet Take 1 tablet (10 mg total) by mouth at bedtime.    Multiple Vitamin (MULTIVITAMIN) tablet Take 1 tablet by mouth daily.    promethazine-dextromethorphan (PROMETHAZINE-DM) 6.25-15 MG/5ML syrup Take 5 mLs by mouth 4 (four) times daily as needed for cough.    triamcinolone (NASACORT) 55 MCG/ACT AERO nasal inhaler Place 2 sprays into the nose 2 (two) times daily.    zinc gluconate 50 MG tablet Take 50 mg by mouth daily.    No facility-administered encounter medications on file as of 08/12/2022.     ONCOLOGIC FAMILY HISTORY:  Family History  Problem Relation Age of Onset   Hypertension Mother    Lung disease Father      GENETIC COUNSELING/TESTING: Not at this time  SOCIAL HISTORY:  Social History   Socioeconomic History   Marital status: Single    Spouse  name: Not on file   Number of children: 0   Years of education: Not on file   Highest education level: Not on file  Occupational History   Occupation: retired    Comment: 2017  Tobacco Use   Smoking status: Never   Smokeless tobacco: Never  Vaping Use   Vaping Use: Never used  Substance and Sexual Activity   Alcohol use: No   Drug use: No   Sexual activity: Not Currently    Comment: not in relationship currently  Other Topics Concern   Not on file  Social History Narrative   Not on file   Social Determinants of Health   Financial Resource Strain: Low Risk  (12/09/2021)   Overall Financial Resource Strain (CARDIA)    Difficulty of Paying Living Expenses: Not hard at all  Food Insecurity: No Food Insecurity (12/09/2021)   Hunger Vital Sign    Worried About Running Out of Food in the Last Year: Never true    [place] in the Last Year: Never true  Transportation Needs: No Transportation Needs (12/09/2021)   PRAPARE - Radiographer, therapeutic (Medical): No    Lack of Transportation (Non-Medical): No  Physical Activity: Insufficiently Active (12/09/2021)   Exercise Vital Sign    Days of Exercise per Week: 7 days    Minutes of Exercise per Session: 20 min  Stress: No Stress Concern Present (12/09/2021)   [place]    Feeling of Stress : Not at all  Social Connections: Moderately Integrated (12/09/2021)   Social Connection and Isolation Panel [NHANES]    Frequency of Communication with Friends and Family: More than three times a week    Frequency of Social Gatherings with Friends and Family: More than three times a week    Attends Religious Services: More than 4 times per year    Active Member of [employer] or Organizations: Yes    Attends Archivist Meetings: More than 4 times per year    Marital Status: Never married  Intimate Partner Violence: Not At Risk (12/09/2021)   Humiliation, Afraid, Rape, and Kick questionnaire    Fear of Current or Ex-Partner: No    Emotionally Abused: No    Physically Abused: No    Sexually Abused: No     OBSERVATIONS/OBJECTIVE:  BP (!) 174/82 (BP Location: Left Arm, Patient Position: Sitting)   Pulse 92   Temp 97.7 F (36.5 C) (Temporal)   Resp 16   Ht 5' 4"$  (1.626 m)   Wt 226 lb 11.2 oz (102.8 kg)   SpO2 97%   BMI 38.91 kg/m   Physical Exam Constitutional:      Appearance: Normal appearance.  Chest:     Comments: Bilateral breasts inspected. Post surgical changes left breast. No new masses or regional adenopathy Musculoskeletal:     Cervical back: Normal range of motion and neck supple. No rigidity.  Lymphadenopathy:     Cervical: No cervical adenopathy.  Neurological:     Mental Status: She is alert.    LABORATORY DATA:  None for this visit.  DIAGNOSTIC IMAGING:  None for this visit.      ASSESSMENT AND PLAN:    Ms.. Radin is a pleasant 72 y.o. female with Stage IA left breast  invasive ductal carcinoma, ER+/PR+/HER2-, diagnosed in 02/2021, treated with lumpectomy, adjuvant radiation therapy, and anti-estrogen therapy with Anastrozole beginning in 05/2021.   She is tolerating anastrozole  well except for intermittent hot flashes. Anticipate anastrozole daily January 2028. Baseline bone density in December 2022 with osteopenia,  No concerning findings on review of systems or physical exam. I reviewed the last mammogram, ultrasound and biopsy results which were benign.  I have once again encouraged her to start going to the [facility] and exercising given her osteopenia.  I have also given her the dental clearance form to discuss with her dentist when she goes back for follow-up in the next couple months. We discussed that there is a risk for osteonecrosis of the jaw with bisphosphonates, hence need for dental clearance. She should consider weight bearing exercises and Vit D daily in the interim Mammogram due October 2024 She can return to clinic in 6 months   Total encounter time: 101mnutes in face to face visit time, chart review, lab review, care coordination, and documentation of the encounter.    *Total Encounter Time as defined by the Centers for Medicare and Medicaid Services includes, in addition to the face-to-face time of a patient visit (documented in the note above) non-face-to-face time: obtaining and reviewing outside history, ordering and reviewing medications, tests or procedures, care coordination (communications with other health care professionals or caregivers) and documentation in the medical record.

## 2022-08-18 DIAGNOSIS — C50412 Malignant neoplasm of upper-outer quadrant of left female breast: Secondary | ICD-10-CM | POA: Diagnosis not present

## 2022-08-18 DIAGNOSIS — Z170 Estrogen receptor positive status [ER+]: Secondary | ICD-10-CM | POA: Diagnosis not present

## 2022-09-15 ENCOUNTER — Encounter: Payer: Self-pay | Admitting: Family Medicine

## 2022-10-19 ENCOUNTER — Ambulatory Visit: Payer: Medicare Other | Attending: Surgery

## 2022-10-19 VITALS — Wt 229.1 lb

## 2022-10-19 DIAGNOSIS — Z483 Aftercare following surgery for neoplasm: Secondary | ICD-10-CM | POA: Insufficient documentation

## 2022-10-19 NOTE — Therapy (Signed)
  OUTPATIENT PHYSICAL THERAPY SOZO SCREENING NOTE   Patient Name: Laurie Mckenzie MRN: [id number] DOB:[birth year], 72 y.o., female Today's Date: 10/19/2022  PCP: Dettinger, Elige Radon, MD REFERRING PROVIDER: Harriette Bouillon, MD   PT End of Session - 10/19/22 0801     Visit Number 2   # unchanged due to screen only   PT Start Time 0800    PT Stop Time 0804    PT Time Calculation (min) 4 min    Activity Tolerance Patient tolerated treatment well    Behavior During Therapy [facility] for tasks assessed/performed             Past Medical History:  Diagnosis Date   Cancer ([facility])    Headache    Hypertension    186/106 today    195/108 07/27/2017   Hypothyroidism    Primary osteoarthritis of knee    Right   Past Surgical History:  Procedure Laterality Date   BREAST LUMPECTOMY WITH RADIOACTIVE SEED AND SENTINEL LYMPH NODE BIOPSY Left 03/27/2021   Procedure: LEFT BREAST LUMPECTOMY WITH RADIOACTIVE SEED AND SENTINEL LYMPH NODE BIOPSY;  Surgeon: Harriette Bouillon, MD;  Location: [facility];  Service: General;  Laterality: Left;   COLONOSCOPY N/A 01/16/2015   Procedure: COLONOSCOPY;  Surgeon: Corbin Ade, MD;  [facility];  Service: Endoscopy;  Laterality: N/A;  8:30 AM   KNEE ARTHROPLASTY Left 2011   [place]   TOTAL KNEE ARTHROPLASTY Right 08/23/2017   Procedure: TOTAL KNEE ARTHROPLASTY;  Surgeon: Salvatore Marvel, MD;  Location: [facility];  Service: Orthopedics;  Laterality: Right;   Patient Active Problem List   Diagnosis Date Noted   Hypertriglyceridemia 07/17/2022   Malignant neoplasm of upper-outer quadrant of left breast in female, estrogen receptor positive 03/17/2021   Primary localized osteoarthritis of right knee 08/23/2017   Hypothyroidism    Hypertension    Chronic allergic rhinitis 03/18/2015   Obesity (BMI 30-39.9) 02/20/2014   Hyperlipidemia 12/25/2013    REFERRING DIAG: left breast cancer at risk for lymphedema  THERAPY DIAG: Aftercare  following surgery for neoplasm  PERTINENT HISTORY: Patient was diagnosed on 02/13/2021 with 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 She underwent a left lumpectomy and sentinel node biopsy on 03/27/2021 with 2 negative nodes removed. It is ER/PR positive and HER2 negative with a Ki67 of 15%  PRECAUTIONS: left UE Lymphedema risk, None  SUBJECTIVE: Pt returns for her 3 month L-Dex screen.    PAIN:  Are you having pain? No  SOZO SCREENING: Patient was assessed today using the SOZO machine to determine the lymphedema index score. This was compared to her baseline score. It was determined that she is within the recommended range when compared to her baseline and no further action is needed at this time. She will continue SOZO screenings. These are done every 3 months for 2 years post operatively followed by every 6 months for 2 years, and then annually.   L-DEX FLOWSHEETS - 10/19/22 0800       L-DEX LYMPHEDEMA SCREENING   Measurement Type Unilateral    L-DEX MEASUREMENT EXTREMITY Upper Extremity    POSITION  Standing    DOMINANT SIDE Right    At Risk Side Left    BASELINE SCORE (UNILATERAL) 2.5    L-DEX SCORE (UNILATERAL) -0.2    VALUE CHANGE (UNILAT) -2.7              Hermenia Bers, PTA 10/19/2022, 8:03 AM

## 2022-11-25 NOTE — Progress Notes (Signed)
[facility] - Clinic Note  12/02/2022     CHIEF COMPLAINT Patient presents for Retina Follow Up    HISTORY OF PRESENT ILLNESS: Laurie Mckenzie is a 72 y.o. female who presents to the clinic today for:   HPI     Retina Follow Up   Patient presents with  Other.  This started years ago.  Duration of 6 months.  I, the attending physician,  performed the HPI with the patient and updated documentation appropriately.        Comments   Patient feels that the vision is the same. She is not using eye drops at this time.       Last edited by Rennis Chris, MD on 12/02/2022  8:34 AM.    Pt is not noticing any distortion in her vision  Referring physician: Desiree Lucy, OD [address]  [place], [place] [address]  HISTORICAL INFORMATION:   Selected notes from the MEDICAL RECORD NUMBER Referred by Dr. Earlene Plater LEE: 06.28.22 Ocular Hx- ERM OU, VMT OS, Cataracts OU. VA OD 20/20, OS 20/30    CURRENT MEDICATIONS: No current outpatient medications on file. (Ophthalmic Drugs)   No current facility-administered medications for this visit. (Ophthalmic Drugs)   Current Outpatient Medications (Other)  Medication Sig   acetaminophen (TYLENOL) 500 MG tablet Take 1,000-1,500 mg by mouth every 6 (six) hours as needed for moderate pain or headache.    amLODipine (NORVASC) 5 MG tablet Take 1 tablet (5 mg total) by mouth daily.   anastrozole (ARIMIDEX) 1 MG tablet Take 1 tablet (1 mg total) by mouth daily.   Ascorbic Acid (VITAMIN C) 100 MG tablet Take 100 mg by mouth daily.   atorvastatin (LIPITOR) 20 MG tablet Take 1 tablet (20 mg total) by mouth daily.   calcium carbonate (OSCAL) 1500 (600 Ca) MG TABS tablet Take 600 mg by mouth 2 (two) times daily with a meal.   Cholecalciferol (VITAMIN D3 PO) Take 1 capsule by mouth daily.   diphenhydrAMINE (BENADRYL) 25 MG tablet Take 25 mg by mouth once.   EPINEPHrine 0.3 mg/0.3 mL IJ SOAJ injection Inject 0.3 mLs (0.3 mgttotal)  into the muscle for 1 dose.   ibuprofen (ADVIL) 800 MG tablet Take 1 tablet (800 mg total) by mouth every 8 (eight) hours as needed.   levothyroxine (SYNTHROID) 150 MCG tablet Take 1 tablet (150 mcg total) by mouth daily.   montelukast (SINGULAIR) 10 MG tablet Take 1 tablet (10 mg total) by mouth at bedtime.   Multiple Vitamin (MULTIVITAMIN) tablet Take 1 tablet by mouth daily.   promethazine-dextromethorphan (PROMETHAZINE-DM) 6.25-15 MG/5ML syrup Take 5 mLs by mouth 4 (four) times daily as needed for cough.   triamcinolone (NASACORT) 55 MCG/ACT AERO nasal inhaler Place 2 sprays into the nose 2 (two) times daily.   zinc gluconate 50 MG tablet Take 50 mg by mouth daily.   No current facility-administered medications for this visit. (Other)   REVIEW OF SYSTEMS: ROS   Positive for: Musculoskeletal, Endocrine, Cardiovascular, Eyes Negative for: Constitutional, Gastrointestinal, Neurological, Skin, Genitourinary, HENT, Respiratory, Psychiatric, Allergic/Imm, Heme/Lymph Last edited by Julieanne Cotton, COT on 12/02/2022  7:32 AM.     ALLERGIES Allergies  Allergen Reactions   Sulfa Antibiotics Other (See Comments)   Bee Venom Swelling and Other (See Comments)    Red and hot to site   Fish Allergy Hives   Ivp Dye [Iodinated Contrast Media] Other (See Comments)    MD advised  not to use, due to fish allergies   PAST MEDICAL HISTORY Past Medical History:  Diagnosis Date   Cancer [facility])    Headache    Hypertension    186/106 today    195/108 07/27/2017   Hypothyroidism    Primary osteoarthritis of knee    Right   Past Surgical History:  Procedure Laterality Date   BREAST LUMPECTOMY WITH RADIOACTIVE SEED AND SENTINEL LYMPH NODE BIOPSY Left 03/27/2021   Procedure: LEFT BREAST LUMPECTOMY WITH RADIOACTIVE SEED AND SENTINEL LYMPH NODE BIOPSY;  Surgeon: Harriette Bouillon, MD;  Location: [facility];  Service: General;  Laterality: Left;   COLONOSCOPY N/A 01/16/2015   Procedure:  COLONOSCOPY;  Surgeon: Corbin Ade, MD;  [facility];  Service: Endoscopy;  Laterality: N/A;  8:30 AM   KNEE ARTHROPLASTY Left 2011   [place]   TOTAL KNEE ARTHROPLASTY Right 08/23/2017   Procedure: TOTAL KNEE ARTHROPLASTY;  Surgeon: Salvatore Marvel, MD;  Location: [facility];  Service: Orthopedics;  Laterality: Right;   FAMILY HISTORY Family History  Problem Relation Age of Onset   Hypertension Mother    Lung disease Father    SOCIAL HISTORY Social History   Tobacco Use   Smoking status: Never   Smokeless tobacco: Never  Vaping Use   Vaping Use: Never used  Substance Use Topics   Alcohol use: No   Drug use: No       OPHTHALMIC EXAM: Base Eye Exam     Visual Acuity (Snellen - Linear)       Right Left   Dist cc 20/20 +1 20/30   Dist ph cc  NI    Correction: Glasses         Tonometry (Tonopen, 7:35 AM)       Right Left   Pressure 9 8         Pupils       Dark Light Shape React APD   Right 4 3 Round Brisk None   Left 4 3 Round Brisk None         Visual Fields       Left Right    Full Full         Extraocular Movement       Right Left    Full, Ortho Full, Ortho         Neuro/Psych     Oriented x3: Yes   Mood/Affect: Normal         Dilation     Both eyes: 1.0% Mydriacyl, 2.5% Phenylephrine @ 7:33 AM           Slit Lamp and Fundus Exam     Slit Lamp Exam       Right Left   Lids/Lashes Dermatochalasis - upper lid, mild meibomian gland dysfunction Dermatochalasis - upper lid, mild MGD   Conjunctiva/Sclera White and quiet White and quiet   Cornea trace PEE, trace tear film debris trace PEE, trace tear film debris   Anterior Chamber deep, clear, narrow angles deep, clear, narrow angles   Iris Round and dilated, mild anterior bowing Round and dilated   Lens 2-3+ Nuclear sclerosis with brunescence, 2-3+ Cortical cataract 2-3+ Nuclear sclerosis with brunescence, 2-3+ Cortical cataract   Anterior Vitreous Vitreous  syneresis, Posterior vitreous detachment Vitreous syneresis, Posterior vitreous detachment, vitreous condensations         Fundus Exam       Right Left   Disc Pink and Sharp, mild PPA/PPP Pink and Sharp,  temporal PPA   C/D Ratio 0.3 0.3   Macula Flat, Blunted foveal reflex, ERM with mild striae, no heme or edema Flat, Blunted foveal reflex, ERM with striae and central thickening -- stable, no heme   Vessels attenuated, mild tortuosity attenuated, mild tortuosity   Periphery Attached; no heme; no RT/RD Attached; no heme           Refraction     Wearing Rx       Sphere Cylinder Axis Add   Right +2.25 +1.00 153 +2.00   Left +1.75 +0.50 178 +2.00            IMAGING AND PROCEDURES  Imaging and Procedures for 12/02/2022  OCT, Retina - OU - Both Eyes       Right Eye Quality was good. Central Foveal Thickness: 354. Progression has been stable. Findings include no IRF, no SRF, abnormal foveal contour, retinal drusen , epiretinal membrane, macular pucker (persistent mild ERM with pucker -- stable).   Left Eye Quality was good. Central Foveal Thickness: 472. Progression has been stable. Findings include no IRF, no SRF, abnormal foveal contour, epiretinal membrane, macular pucker (ERM with central thickening and mild pucker -- no significant change from prior).   Notes *Images captured and stored on drive  Diagnosis / Impression:  ERM with pucker OU (OS>OD) OD: persistent mild ERM with pucker -- stable OS: ERM with central thickening and mild pucker -- no significant change from prior  Clinical management:  See below  Abbreviations: NFP - Normal foveal profile. CME - cystoid macular edema. PED - pigment epithelial detachment. IRF - intraretinal fluid. SRF - subretinal fluid. EZ - ellipsoid zone. ERM - epiretinal membrane. ORA - outer retinal atrophy. ORT - outer retinal tubulation. SRHM - subretinal hyper-reflective material. IRHM - intraretinal hyper-reflective  material            ASSESSMENT/PLAN:    ICD-10-CM   1. Epiretinal membrane (ERM) of both eyes  H35.373 OCT, Retina - OU - Both Eyes    2. Essential hypertension  I10     3. Hypertensive retinopathy of both eyes  H35.033     4. Combined forms of age-related cataract of both eyes  H25.813      1,2. Epiretinal membrane, both eyes (OS>OD)  - pt reports history of ERM dates back to 2018-2019 -- initially noted by Dr. Mayford Knife - ERM w/ pucker OU (OS > OD) -- OS with loss of foveal contour and +central thickening - BCVA OD: 20/20, OS: 20/30 -- stable OU - OCT w/o IRF/SRF and ERMs stable from prior - mild blurring OU, no metamorphopsia - no indication for surgery at this time - monitor for now - f/u 1 year - DFE/OCT  3,4. Hypertensive retinopathy OU - discussed importance of tight BP control - monitor  5. Mixed Cataract OU - The symptoms of cataract, surgical options, and treatments and risks were discussed with patient. - discussed diagnosis and progression - discussed recommendation of having cataract surgery prior to any retina surgery, if possible - monitor  Ophthalmic Meds Ordered this visit:  No orders of the defined types were placed in this encounter.    Return in about 1 year (around 12/02/2023) for f/u ERM OU, DFE, OCT.  There are no Patient Instructions on file for this visit.   This document serves as a record of services personally performed by Karie Chimera, MD, PhD. It was created on their behalf by De Blanch, an ophthalmic technician. The creation of  this record is the provider's dictation and/or activities during the visit.    Electronically signed by: De Blanch, OA, 12/02/22  8:34 AM  This document serves as a record of services personally performed by Karie Chimera, MD, PhD. It was created on their behalf by Glee Arvin. Manson Passey, OA an ophthalmic technician. The creation of this record is the provider's dictation and/or activities during the  visit.    Electronically signed by: Glee Arvin. Manson Passey, [place] 06.05.2024 8:34 AM  Karie Chimera, M.D., Ph.D. Diseases & Surgery of the Retina and Vitreous [facility]  I have reviewed the above documentation for accuracy and completeness, and I agree with the above. Karie Chimera, M.D., Ph.D. 12/02/22 8:35 AM   Abbreviations: M myopia (nearsighted); A astigmatism; H hyperopia (farsighted); P presbyopia; Mrx spectacle prescription;  CTL contact lenses; OD right eye; OS left eye; OU both eyes  XT exotropia; ET esotropia; PEK punctate epithelial keratitis; PEE punctate epithelial erosions; DES dry eye syndrome; MGD meibomian gland dysfunction; ATs artificial tears; PFAT's preservative free artificial tears; NSC nuclear sclerotic cataract; PSC posterior subcapsular cataract; ERM epi-retinal membrane; PVD posterior vitreous detachment; RD retinal detachment; DM diabetes mellitus; DR diabetic retinopathy; NPDR non-proliferative diabetic retinopathy; PDR proliferative diabetic retinopathy; CSME clinically significant macular edema; DME diabetic macular edema; dbh dot blot hemorrhages; CWS cotton wool spot; POAG primary open angle glaucoma; C/D cup-to-disc ratio; HVF humphrey visual field; GVF goldmann visual field; OCT optical coherence tomography; IOP intraocular pressure; BRVO Branch retinal vein occlusion; CRVO central retinal vein occlusion; CRAO central retinal artery occlusion; BRAO branch retinal artery occlusion; RT retinal tear; SB scleral buckle; PPV pars plana vitrectomy; VH Vitreous hemorrhage; PRP panretinal laser photocoagulation; IVK intravitreal kenalog; VMT vitreomacular traction; MH Macular hole;  NVD neovascularization of the disc; NVE neovascularization elsewhere; AREDS age related eye disease study; ARMD age related macular degeneration; POAG primary open angle glaucoma; EBMD epithelial/anterior basement membrane dystrophy; ACIOL anterior chamber intraocular lens; IOL  intraocular lens; PCIOL posterior chamber intraocular lens; Phaco/IOL phacoemulsification with intraocular lens placement; PRK photorefractive keratectomy; LASIK laser assisted in situ keratomileusis; HTN hypertension; DM diabetes mellitus; COPD chronic obstructive pulmonary disease

## 2022-11-27 ENCOUNTER — Other Ambulatory Visit: Payer: Self-pay | Admitting: Hematology and Oncology

## 2022-11-27 DIAGNOSIS — M85859 Other specified disorders of bone density and structure, unspecified thigh: Secondary | ICD-10-CM

## 2022-11-27 DIAGNOSIS — M8580 Other specified disorders of bone density and structure, unspecified site: Secondary | ICD-10-CM | POA: Insufficient documentation

## 2022-12-02 ENCOUNTER — Telehealth: Payer: Self-pay | Admitting: Hematology and Oncology

## 2022-12-02 ENCOUNTER — Encounter (INDEPENDENT_AMBULATORY_CARE_PROVIDER_SITE_OTHER): Payer: Self-pay | Admitting: Ophthalmology

## 2022-12-02 ENCOUNTER — Ambulatory Visit (INDEPENDENT_AMBULATORY_CARE_PROVIDER_SITE_OTHER): Payer: Medicare Other | Admitting: Ophthalmology

## 2022-12-02 DIAGNOSIS — H35373 Puckering of macula, bilateral: Secondary | ICD-10-CM | POA: Diagnosis not present

## 2022-12-02 DIAGNOSIS — H25813 Combined forms of age-related cataract, bilateral: Secondary | ICD-10-CM | POA: Diagnosis not present

## 2022-12-02 DIAGNOSIS — H35033 Hypertensive retinopathy, bilateral: Secondary | ICD-10-CM | POA: Diagnosis not present

## 2022-12-02 DIAGNOSIS — I10 Essential (primary) hypertension: Secondary | ICD-10-CM

## 2022-12-02 NOTE — Telephone Encounter (Signed)
Scheduled appointments per staff message. Left voicemail. 

## 2022-12-11 ENCOUNTER — Ambulatory Visit (INDEPENDENT_AMBULATORY_CARE_PROVIDER_SITE_OTHER): Payer: Medicare Other

## 2022-12-11 VITALS — Ht 64.0 in | Wt 221.0 lb

## 2022-12-11 DIAGNOSIS — Z0000 Encounter for general adult medical examination without abnormal findings: Secondary | ICD-10-CM

## 2022-12-11 NOTE — Patient Instructions (Signed)
Laurie Mckenzie , Thank you for taking time to come for your Medicare Wellness Visit. I appreciate your ongoing commitment to your health goals. Please review the following plan we discussed and let me know if I can assist you in the future.   These are the goals we discussed:  Goals      Patient Stated     12/09/2021 AWV Goal: Exercise for General Health  Patient will verbalize understanding of the benefits of increased physical activity: Exercising regularly is important. It will improve your overall fitness, flexibility, and endurance. Regular exercise also will improve your overall health. It can help you control your weight, reduce stress, and improve your bone density. Over the next year, patient will increase physical activity as tolerated with a goal of at least 150 minutes of moderate physical activity per week.  You can tell that you are exercising at a moderate intensity if your heart starts beating faster and you start breathing faster but can still hold a conversation. Moderate-intensity exercise ideas include: Walking 1 mile (1.6 km) in about 15 minutes Biking Hiking Golfing Dancing Water aerobics Patient will verbalize understanding of everyday activities that increase physical activity by providing examples like the following: Yard work, such as: Insurance underwriter Gardening Washing windows or floors Patient will be able to explain general safety guidelines for exercising:  Before you start a new exercise program, talk with your health care provider. Do not exercise so much that you hurt yourself, feel dizzy, or get very short of breath. Wear comfortable clothes and wear shoes with good support. Drink plenty of water while you exercise to prevent dehydration or heat stroke. Work out until your breathing and your heartbeat get faster.      Weight (lb) < 200 lb (90.7 kg)     12/09/2021 -She  hopes to lose weight - stick to fruits, vegetables and lean meats - try to be active 30 minutes each day        This is a list of the screening recommended for you and due dates:  Health Maintenance  Topic Date Due   COVID-19 Vaccine (4 - 2023-24 season) 02/27/2022   Flu Shot  01/28/2023   Mammogram  03/31/2023   DEXA scan (bone density measurement)  06/28/2023   Medicare Annual Wellness Visit  12/11/2023   Colon Cancer Screening  01/15/2025   DTaP/Tdap/Td vaccine (3 - Td or Tdap) 07/19/2030   Pneumonia Vaccine  Completed   Hepatitis C Screening  Completed   Zoster (Shingles) Vaccine  Completed   HPV Vaccine  Aged Out    Advanced directives: In Chart  Conditions/risks identified: Aim for 30 minutes of exercise or brisk walking, 6-8 glasses of water, and 5 servings of fruits and vegetables each day.   Next appointment: Follow up in one year for your annual wellness visit.   Preventive Care 10 Years and Older, Female  Preventive care refers to lifestyle choices and visits with your health care provider that can promote health and wellness. What does preventive care include? A yearly physical exam. This is also called an annual well check. Dental exams once or twice a year. Routine eye exams. Ask your health care provider how often you should have your eyes checked. Personal lifestyle choices, including: Daily care of your teeth and gums. Regular physical activity. Eating a healthy diet. Avoiding tobacco and drug use. Limiting alcohol use. Practicing safe sex. Taking  low doses of aspirin every day. Taking vitamin and mineral supplements as recommended by your health care provider. What happens during an annual well check? The services and screenings done by your health care provider during your annual well check will depend on your age, overall health, lifestyle risk factors, and family history of disease. Counseling  Your health care provider may ask you questions about  your: Alcohol use. Tobacco use. Drug use. Emotional well-being. Home and relationship well-being. Sexual activity. Eating habits. History of falls. Memory and ability to understand (cognition). Work and work Astronomer. Screening  You may have the following tests or measurements: Height, weight, and BMI. Blood pressure. Lipid and cholesterol levels. These may be checked every 5 years, or more frequently if you are over 96 years old. Skin check. Lung cancer screening. You may have this screening every year starting at age 39 if you have a 30-pack-year history of smoking and currently smoke or have quit within the past 15 years. Fecal occult blood test (FOBT) of the stool. You may have this test every year starting at age 86. Flexible sigmoidoscopy or colonoscopy. You may have a sigmoidoscopy every 5 years or a colonoscopy every 10 years starting at age 25. Prostate cancer screening. Recommendations will vary depending on your family history and other risks. Hepatitis C blood test. Hepatitis B blood test. Sexually transmitted disease (STD) testing. Diabetes screening. This is done by checking your blood sugar (glucose) after you have not eaten for a while (fasting). You may have this done every 1-3 years. Abdominal aortic aneurysm (AAA) screening. You may need this if you are a current or former smoker. Osteoporosis. You may be screened starting at age 35 if you are at high risk. Talk with your health care provider about your test results, treatment options, and if necessary, the need for more tests. Vaccines  Your health care provider may recommend certain vaccines, such as: Influenza vaccine. This is recommended every year. Tetanus, diphtheria, and acellular pertussis (Tdap, Td) vaccine. You may need a Td booster every 10 years. Zoster vaccine. You may need this after age 23. Pneumococcal 13-valent conjugate (PCV13) vaccine. One dose is recommended after age 97. Pneumococcal  polysaccharide (PPSV23) vaccine. One dose is recommended after age 14. Talk to your health care provider about which screenings and vaccines you need and how often you need them. This information is not intended to replace advice given to you by your health care provider. Make sure you discuss any questions you have with your health care provider. Document Released: 07/12/2015 Document Revised: 03/04/2016 Document Reviewed: 04/16/2015 Elsevier Interactive Patient Education  2017 [employer].  Fall Prevention in the Home Falls can cause injuries. They can happen to people of all ages. There are many things you can do to make your home safe and to help prevent falls. What can I do on the outside of my home? Regularly fix the edges of walkways and driveways and fix any cracks. Remove anything that might make you trip as you walk through a door, such as a raised step or threshold. Trim any bushes or trees on the path to your home. Use bright outdoor lighting. Clear any walking paths of anything that might make someone trip, such as rocks or tools. Regularly check to see if handrails are loose or broken. Make sure that both sides of any steps have handrails. Any raised decks and porches should have guardrails on the edges. Have any leaves, snow, or ice cleared regularly. Use sand  or salt on walking paths during winter. Clean up any spills in your garage right away. This includes oil or grease spills. What can I do in the bathroom? Use night lights. Install grab bars by the toilet and in the tub and shower. Do not use towel bars as grab bars. Use non-skid mats or decals in the tub or shower. If you need to sit down in the shower, use a plastic, non-slip stool. Keep the floor dry. Clean up any water that spills on the floor as soon as it happens. Remove soap buildup in the tub or shower regularly. Attach bath mats securely with double-sided non-slip rug tape. Do not have throw rugs and other  things on the floor that can make you trip. What can I do in the bedroom? Use night lights. Make sure that you have a light by your bed that is easy to reach. Do not use any sheets or blankets that are too big for your bed. They should not hang down onto the floor. Have a firm chair that has side arms. You can use this for support while you get dressed. Do not have throw rugs and other things on the floor that can make you trip. What can I do in the kitchen? Clean up any spills right away. Avoid walking on wet floors. Keep items that you use a lot in easy-to-reach places. If you need to reach something above you, use a strong step stool that has a grab bar. Keep electrical cords out of the way. Do not use floor polish or wax that makes floors slippery. If you must use wax, use non-skid floor wax. Do not have throw rugs and other things on the floor that can make you trip. What can I do with my stairs? Do not leave any items on the stairs. Make sure that there are handrails on both sides of the stairs and use them. Fix handrails that are broken or loose. Make sure that handrails are as long as the stairways. Check any carpeting to make sure that it is firmly attached to the stairs. Fix any carpet that is loose or worn. Avoid having throw rugs at the top or bottom of the stairs. If you do have throw rugs, attach them to the floor with carpet tape. Make sure that you have a light switch at the top of the stairs and the bottom of the stairs. If you do not have them, ask someone to add them for you. What else can I do to help prevent falls? Wear shoes that: Do not have high heels. Have rubber bottoms. Are comfortable and fit you well. Are closed at the toe. Do not wear sandals. If you use a stepladder: Make sure that it is fully opened. Do not climb a closed stepladder. Make sure that both sides of the stepladder are locked into place. Ask someone to hold it for you, if possible. Clearly  mark and make sure that you can see: Any grab bars or handrails. First and last steps. Where the edge of each step is. Use tools that help you move around (mobility aids) if they are needed. These include: Canes. Walkers. Scooters. Crutches. Turn on the lights when you go into a dark area. Replace any light bulbs as soon as they burn out. Set up your furniture so you have a clear path. Avoid moving your furniture around. If any of your floors are uneven, fix them. If there are any pets around you, be aware  of where they are. Review your medicines with your doctor. Some medicines can make you feel dizzy. This can increase your chance of falling. Ask your doctor what other things that you can do to help prevent falls. This information is not intended to replace advice given to you by your health care provider. Make sure you discuss any questions you have with your health care provider. Document Released: 04/11/2009 Document Revised: 11/21/2015 Document Reviewed: 07/20/2014 Elsevier Interactive Patient Education  2017 [employer].

## 2022-12-11 NOTE — Progress Notes (Signed)
Subjective:   Laurie Mckenzie is a 72 y.o. female who presents for Medicare Annual (Subsequent) preventive examination. I connected with  Tekia A Tierno on 12/11/22 by a audio enabled telemedicine application and verified that I am speaking with the correct person using two identifiers.  Patient Location: Home  Provider Location: Home Office  I discussed the limitations of evaluation and management by telemedicine. The patient expressed understanding and agreed to proceed.  Review of Systems     Cardiac Risk Factors include: advanced age (>12men, >75 women);dyslipidemia;hypertension     Objective:    Today's Vitals   12/11/22 1347  Weight: 221 lb (100.2 kg)  Height: 5\' 4"  (1.626 m)   Body mass index is 37.93 kg/m.     12/11/2022    1:50 PM 12/09/2021    3:43 PM 04/23/2021   10:10 AM 03/27/2021   10:13 AM 03/20/2021    3:19 PM 03/19/2021   11:51 AM 03/19/2021    9:33 AM  Advanced Directives  Does Patient Have a Medical Advance Directive? Yes Yes No No No No Yes  Type of Estate agent of [place];Living will Healthcare Power of [place];Living will       Does patient want to make changes to medical advance directive? No - Patient declined   No - Patient declined  No - Patient declined No - Patient declined  Copy of Healthcare Power of Attorney in Chart? Yes - validated most recent copy scanned in chart (See row information) Yes - validated most recent copy scanned in chart (See row information)       Would patient like information on creating a medical advance directive?   Yes (ED - Information included in AVS) No - Patient declined No - Patient declined No - Patient declined     Current Medications (verified) Outpatient Encounter Medications as of 12/11/2022  Medication Sig   acetaminophen (TYLENOL) 500 MG tablet Take 1,000-1,500 mg by mouth every 6 (six) hours as needed for moderate pain or headache.    amLODipine (NORVASC) 5 MG tablet Take 1 tablet (5 mg  total) by mouth daily.   anastrozole (ARIMIDEX) 1 MG tablet Take 1 tablet (1 mg total) by mouth daily.   Ascorbic Acid (VITAMIN C) 100 MG tablet Take 100 mg by mouth daily.   atorvastatin (LIPITOR) 20 MG tablet Take 1 tablet (20 mg total) by mouth daily.   calcium carbonate (OSCAL) 1500 (600 Ca) MG TABS tablet Take 600 mg by mouth 2 (two) times daily with a meal.   Cholecalciferol (VITAMIN D3 PO) Take 1 capsule by mouth daily.   diphenhydrAMINE (BENADRYL) 25 MG tablet Take 25 mg by mouth once.   EPINEPHrine 0.3 mg/0.3 mL IJ SOAJ injection Inject 0.3 mLs (0.3 mgttotal) into the muscle for 1 dose.   ibuprofen (ADVIL) 800 MG tablet Take 1 tablet (800 mg total) by mouth every 8 (eight) hours as needed.   levothyroxine (SYNTHROID) 150 MCG tablet Take 1 tablet (150 mcg total) by mouth daily.   montelukast (SINGULAIR) 10 MG tablet Take 1 tablet (10 mg total) by mouth at bedtime.   Multiple Vitamin (MULTIVITAMIN) tablet Take 1 tablet by mouth daily.   promethazine-dextromethorphan (PROMETHAZINE-DM) 6.25-15 MG/5ML syrup Take 5 mLs by mouth 4 (four) times daily as needed for cough.   triamcinolone (NASACORT) 55 MCG/ACT AERO nasal inhaler Place 2 sprays into the nose 2 (two) times daily.   zinc gluconate 50 MG tablet Take 50 mg by mouth daily.  No facility-administered encounter medications on file as of 12/11/2022.    Allergies (verified) Sulfa antibiotics, Bee venom, Fish allergy, and Ivp dye [iodinated contrast media]   History: Past Medical History:  Diagnosis Date   Cancer (HCC)    Headache    Hypertension    186/106 today    195/108 07/27/2017   Hypothyroidism    Primary osteoarthritis of knee    Right   Past Surgical History:  Procedure Laterality Date   BREAST LUMPECTOMY WITH RADIOACTIVE SEED AND SENTINEL LYMPH NODE BIOPSY Left 03/27/2021   Procedure: LEFT BREAST LUMPECTOMY WITH RADIOACTIVE SEED AND SENTINEL LYMPH NODE BIOPSY;  Surgeon: Harriette Bouillon, MD;  Location: [facility];  Service: General;  Laterality: Left;   COLONOSCOPY N/A 01/16/2015   Procedure: COLONOSCOPY;  Surgeon: Corbin Ade, MD;  [facility];  Service: Endoscopy;  Laterality: N/A;  8:30 AM   KNEE ARTHROPLASTY Left 2011   [place]   TOTAL KNEE ARTHROPLASTY Right 08/23/2017   Procedure: TOTAL KNEE ARTHROPLASTY;  Surgeon: Salvatore Marvel, MD;  Location: [facility];  Service: Orthopedics;  Laterality: Right;   Family History  Problem Relation Age of Onset   Hypertension Mother    Lung disease Father    Social History   Socioeconomic History   Marital status: Single    Spouse name: Not on file   Number of children: 0   Years of education: Not on file   Highest education level: Not on file  Occupational History   Occupation: retired    Comment: 2017  Tobacco Use   Smoking status: Never   Smokeless tobacco: Never  Vaping Use   Vaping Use: Never used  Substance and Sexual Activity   Alcohol use: No   Drug use: No   Sexual activity: Not Currently    Comment: not in relationship currently  Other Topics Concern   Not on file  Social History Narrative   Not on file   Social Determinants of Health   Financial Resource Strain: Low Risk  (12/11/2022)   Overall Financial Resource Strain (CARDIA)    Difficulty of Paying Living Expenses: Not hard at all  Food Insecurity: No Food Insecurity (12/11/2022)   Hunger Vital Sign    Worried About Running Out of Food in the Last Year: Never true    Ran Out of Food in the Last Year: Never true  Transportation Needs: No Transportation Needs (12/11/2022)   PRAPARE - Administrator, Civil Service (Medical): No    Lack of Transportation (Non-Medical): No  Physical Activity: Insufficiently Active (12/11/2022)   Exercise Vital Sign    Days of Exercise per Week: 3 days    Minutes of Exercise per Session: 30 min  Stress: No Stress Concern Present (12/11/2022)   [employer] of Occupational Health - Occupational  Stress Questionnaire    Feeling of Stress : Not at all  Social Connections: Moderately Isolated (12/11/2022)   Social Connection and Isolation Panel [NHANES]    Frequency of Communication with Friends and Family: More than three times a week    Frequency of Social Gatherings with Friends and Family: More than three times a week    Attends Religious Services: More than 4 times per year    Active Member of [employer] or Organizations: No    Attends Banker Meetings: Never    Marital Status: Never married    Tobacco Counseling Counseling given: Not Answered   Clinical Intake:  Pre-visit  preparation completed: Yes  Pain : No/denies pain     Nutritional Risks: None Diabetes: No  How often do you need to have someone help you when you read instructions, pamphlets, or other written materials from your doctor or pharmacy?: 1 - Never  Diabetic?no   Interpreter Needed?: No  Information entered by :: Renie Ora, LPN   Activities of Daily Living    12/11/2022    1:51 PM  In your present state of health, do you have any difficulty performing the following activities:  Hearing? 0  Vision? 0  Difficulty concentrating or making decisions? 0  Walking or climbing stairs? 0  Dressing or bathing? 0  Doing errands, shopping? 0  Preparing Food and eating ? N  Using the Toilet? N  In the past six months, have you accidently leaked urine? N  Do you have problems with loss of bowel control? N  Managing your Medications? N  Managing your Finances? N  Housekeeping or managing your Housekeeping? N    Patient Care Team: Dettinger, Elige Radon, MD as PCP - General (Family Medicine) Harriette Bouillon, MD as Consulting Physician (General Surgery) Dorothy Puffer, MD as Consulting Physician (Radiation Oncology) Rennis Chris, MD as Consulting Physician (Ophthalmology) Rachel Moulds, MD as Consulting Physician (Hematology and Oncology) Dr [facility], OD  (Optometry)  Indicate any recent Medical Services you may have received from other than [facility] providers in the past year (date may be approximate).     Assessment:   This is a routine wellness examination for Carline.  Hearing/Vision screen Vision Screening - Comments:: Wears rx glasses - up to date with routine eye exams with  Dr.Davis   Dietary issues and exercise activities discussed: Current Exercise Habits: Home exercise routine, Type of exercise: walking, Time (Minutes): 30, Frequency (Times/Week): 3, Weekly Exercise (Minutes/Week): 90, Intensity: Mild, Exercise limited by: None identified   Goals Addressed             This Visit's Progress    Patient Stated   On track    12/09/2021 AWV Goal: Exercise for General Health  Patient will verbalize understanding of the benefits of increased physical activity: Exercising regularly is important. It will improve your overall fitness, flexibility, and endurance. Regular exercise also will improve your overall health. It can help you control your weight, reduce stress, and improve your bone density. Over the next year, patient will increase physical activity as tolerated with a goal of at least 150 minutes of moderate physical activity per week.  You can tell that you are exercising at a moderate intensity if your heart starts beating faster and you start breathing faster but can still hold a conversation. Moderate-intensity exercise ideas include: Walking 1 mile (1.6 km) in about 15 minutes Biking Hiking Golfing Dancing Water aerobics Patient will verbalize understanding of everyday activities that increase physical activity by providing examples like the following: Yard work, such as: Insurance underwriter Gardening Washing windows or floors Patient will be able to explain general safety guidelines for exercising:  Before you start a new exercise  program, talk with your health care provider. Do not exercise so much that you hurt yourself, feel dizzy, or get very short of breath. Wear comfortable clothes and wear shoes with good support. Drink plenty of water while you exercise to prevent dehydration or heat stroke. Work out until your breathing and your heartbeat get faster.  Depression Screen    12/11/2022    1:49 PM 07/17/2022    8:23 AM 01/14/2022    8:07 AM 12/09/2021    3:42 PM 07/21/2021    9:48 AM 01/16/2021    8:02 AM 12/09/2020    8:35 AM  PHQ 2/9 Scores  PHQ - 2 Score 0 0 0 0 0 0 0  PHQ- 9 Score  0 0        Fall Risk    12/11/2022    1:47 PM 07/17/2022    8:23 AM 01/14/2022    8:07 AM 12/09/2021    3:33 PM 07/21/2021    9:48 AM  Fall Risk   Falls in the past year? 0 0 0 0 0  Number falls in past yr: 0   0   Injury with Fall? 0   0   Risk for fall due to : No Fall Risks   Orthopedic patient   Follow up Falls prevention discussed   Falls prevention discussed     FALL RISK PREVENTION PERTAINING TO THE HOME:  Any stairs in or around the home? Yes  If so, are there any without handrails? No  Home free of loose throw rugs in walkways, pet beds, electrical cords, etc? Yes  Adequate lighting in your home to reduce risk of falls? Yes   ASSISTIVE DEVICES UTILIZED TO PREVENT FALLS:  Life alert? No  Use of a cane, walker or w/c? No  Grab bars in the bathroom? Yes  Shower chair or bench in shower? Yes  Elevated toilet seat or a handicapped toilet? No          12/11/2022    1:51 PM 12/09/2021    3:45 PM 12/04/2020    9:22 AM  6CIT Screen  What Year? 0 points 0 points 0 points  What month? 0 points 0 points 0 points  What time? 0 points 0 points 0 points  Count back from 20 0 points 0 points 0 points  Months in reverse 0 points 0 points 0 points  Repeat phrase 0 points 0 points 0 points  Total Score 0 points 0 points 0 points    Immunizations Immunization History  Administered Date(s) Administered    Fluad Quad(high Dose 65+) 03/21/2019, 03/26/2020, 04/08/2021, 04/09/2022   Influenza Split 03/29/2013   Influenza, High Dose Seasonal PF 04/15/2017, 04/18/2018   Influenza,inj,Quad PF,6+ Mos 04/14/2016   Influenza,trivalent, recombinat, inj, PF 03/29/2013, 04/26/2014   Influenza-Unspecified 04/26/2014, 04/15/2015, 04/15/2017, 04/18/2018   Moderna Sars-Covid-2 Vaccination 08/10/2019, 09/08/2019, 04/23/2020   Pneumococcal Conjugate-13 10/13/2016   Pneumococcal Polysaccharide-23 02/20/2012, 10/14/2017   Tdap 02/19/2010, 07/19/2020   Zoster Recombinat (Shingrix) 04/24/2017, 08/14/2017   Zoster, Live 02/20/2012    TDAP status: Up to date  Flu Vaccine status: Up to date  Pneumococcal vaccine status: Up to date  Covid-19 vaccine status: Completed vaccines  Qualifies for Shingles Vaccine? Yes   Zostavax completed Yes   Shingrix Completed?: Yes  Screening Tests Health Maintenance  Topic Date Due   COVID-19 Vaccine (4 - 2023-24 season) 02/27/2022   INFLUENZA VACCINE  01/28/2023   MAMMOGRAM  03/31/2023   DEXA SCAN  06/28/2023   Medicare Annual Wellness (AWV)  12/11/2023   Colonoscopy  01/15/2025   DTaP/Tdap/Td (3 - Td or Tdap) 07/19/2030   Pneumonia Vaccine 41+ Years old  Completed   Hepatitis C Screening  Completed   Zoster Vaccines- Shingrix  Completed   HPV VACCINES  Aged Out    Health Maintenance  Health  Maintenance Due  Topic Date Due   COVID-19 Vaccine (4 - 2023-24 season) 02/27/2022    Colorectal cancer screening: Type of screening: Colonoscopy. Completed 01/16/2015. Repeat every 10 years  Mammogram status: Completed 03/30/2022. Repeat every year  Bone Density status: Completed 06/17/2021. Results reflect: Bone density results: OSTEOPOROSIS. Repeat every 2 years.  Lung Cancer Screening: (Low Dose CT Chest recommended if Age 67-80 years, 30 pack-year currently smoking OR have quit w/in 15years.) does not qualify.   Lung Cancer Screening Referral: n/a  Additional  Screening:  Hepatitis C Screening: does not qualify; Completed 01/17/2019  Vision Screening: Recommended annual ophthalmology exams for early detection of glaucoma and other disorders of the eye. Is the patient up to date with their annual eye exam?  Yes  Who is the provider or what is the name of the office in which the patient attends annual eye exams? Dr.Davis  If pt is not established with a provider, would they like to be referred to a provider to establish care? No .   Dental Screening: Recommended annual dental exams for proper oral hygiene  Community Resource Referral / Chronic Care Management: CRR required this visit?  No   CCM required this visit?  No      Plan:     I have personally reviewed and noted the following in the patient's chart:   Medical and social history Use of alcohol, tobacco or illicit drugs  Current medications and supplements including opioid prescriptions. Patient is not currently taking opioid prescriptions. Functional ability and status Nutritional status Physical activity Advanced directives List of other physicians Hospitalizations, surgeries, and ER visits in previous 12 months Vitals Screenings to include cognitive, depression, and falls Referrals and appointments  In addition, I have reviewed and discussed with patient certain preventive protocols, quality metrics, and best practice recommendations. A written personalized care plan for preventive services as well as general preventive health recommendations were provided to patient.     Lorrene Reid, LPN   [id number]/61/0960   Nurse Notes: none

## 2022-12-21 ENCOUNTER — Ambulatory Visit
Admission: EM | Admit: 2022-12-21 | Discharge: 2022-12-21 | Disposition: A | Discharge status: Home / Self Care | Payer: Medicare Other | Attending: Nurse Practitioner | Admitting: Nurse Practitioner

## 2022-12-21 DIAGNOSIS — R21 Rash and other nonspecific skin eruption: Secondary | ICD-10-CM | POA: Diagnosis not present

## 2022-12-21 DIAGNOSIS — S1086XA Insect bite of other specified part of neck, initial encounter: Secondary | ICD-10-CM

## 2022-12-21 DIAGNOSIS — W57XXXA Bitten or stung by nonvenomous insect and other nonvenomous arthropods, initial encounter: Secondary | ICD-10-CM

## 2022-12-21 MED ORDER — DEXAMETHASONE SODIUM PHOSPHATE 10 MG/ML IJ SOLN
10.0000 mg | Freq: Once | INTRAMUSCULAR | Status: AC
  Administered 2022-12-21: 10 mg via INTRAMUSCULAR

## 2022-12-21 MED ORDER — TRIAMCINOLONE ACETONIDE 0.1 % EX OINT: 1.0000 | TOPICAL_OINTMENT | Freq: Two times a day (BID) | CUTANEOUS | 0 refills | Status: DC

## 2022-12-21 NOTE — ED Triage Notes (Signed)
P[t reports she feels like something bit her on the back of her neck and now has a rash on her face, arms and neck x 2 days that itches.

## 2022-12-21 NOTE — Discharge Instructions (Signed)
We have given you a steroid shot today to help with inflammation.  Start taking an oral antihistamine (Zyrtec, Allegra, or Claritin) 2 pills twice daily to help with itching for the next few days.  You can continue Benadryl every 6 hours during the day as needed for itching.  You can also apply the triamcinolone ointment up to twice daily as needed for itching.  Seek care if symptoms persist or worsen despite treatment.

## 2022-12-21 NOTE — ED Provider Notes (Signed)
[facility]    CSN: [id number] Arrival date & time: 12/21/22  0803      History   Chief Complaint No chief complaint on file.   HPI Laurie Mckenzie is a 72 y.o. female.   Patient presents today for 2-day history of bug bites to her neck and now itchy, red spots on her face, arms, front of her chest that are red.  She reports over the weekend, she was out of town and went golfing with some friends.  Reports were a lot of bugs on the golf course.  Reports the itching started after they sat down for dinner, reports she is allergic to fish but did not eat any fish.  Itching also started before eating dinner.  No oozing of the spots, scaling, blisters, pain, fevers, nausea/vomiting.  No recent change in detergents, soaps, or personal care products since the rash started.  No recent illness or history of similar.  Reports friends who went with her on the trip do not have the same rash.  Has taken Benadryl and Tylenol for symptoms with minimal improvement.  She denies shortness of breath, throat or tongue swelling, new muscle pain or joint aches, or difficulty swallowing.    Past Medical History:  Diagnosis Date   Cancer [facility])    Headache    Hypertension    186/106 today    195/108 07/27/2017   Hypothyroidism    Primary osteoarthritis of knee    Right    Patient Active Problem List   Diagnosis Date Noted   Osteopenia 11/27/2022   Hypertriglyceridemia 07/17/2022   Malignant neoplasm of upper-outer quadrant of left breast in female, estrogen receptor positive (HCC) 03/17/2021   Primary localized osteoarthritis of right knee 08/23/2017   Hypothyroidism    Hypertension    Chronic allergic rhinitis 03/18/2015   Obesity (BMI 30-39.9) 02/20/2014   Hyperlipidemia 12/25/2013    Past Surgical History:  Procedure Laterality Date   BREAST LUMPECTOMY WITH RADIOACTIVE SEED AND SENTINEL LYMPH NODE BIOPSY Left 03/27/2021   Procedure: LEFT BREAST LUMPECTOMY WITH RADIOACTIVE SEED AND  SENTINEL LYMPH NODE BIOPSY;  Surgeon: Harriette Bouillon, MD;  Location: [facility];  Service: General;  Laterality: Left;   COLONOSCOPY N/A 01/16/2015   Procedure: COLONOSCOPY;  Surgeon: Corbin Ade, MD;  [facility];  Service: Endoscopy;  Laterality: N/A;  8:30 AM   KNEE ARTHROPLASTY Left 2011   [place]   TOTAL KNEE ARTHROPLASTY Right 08/23/2017   Procedure: TOTAL KNEE ARTHROPLASTY;  Surgeon: Salvatore Marvel, MD;  Location: [facility];  Service: Orthopedics;  Laterality: Right;    OB History   No obstetric history on file.      Home Medications    Prior to Admission medications   Medication Sig Start Date End Date Taking? Authorizing Provider  triamcinolone ointment (KENALOG) 0.1 % Apply 1 Application topically 2 (two) times daily. 12/21/22  Yes Valentino Nose, NP  acetaminophen (TYLENOL) 500 MG tablet Take 1,000-1,500 mg by mouth every 6 (six) hours as needed for moderate pain or headache.     [provider]  amLODipine (NORVASC) 5 MG tablet Take 1 tablet (5 mg total) by mouth daily. 07/17/22   Dettinger, Elige Radon, MD  anastrozole (ARIMIDEX) 1 MG tablet Take 1 tablet (1 mg total) by mouth daily. 06/17/22   Rachel Moulds, MD  Ascorbic Acid (VITAMIN C) 100 MG tablet Take 100 mg by mouth daily.    [provider]  atorvastatin (LIPITOR)  20 MG tablet Take 1 tablet (20 mg total) by mouth daily. 07/17/22   Dettinger, Elige Radon, MD  calcium carbonate (OSCAL) 1500 (600 Ca) MG TABS tablet Take 600 mg by mouth 2 (two) times daily with a meal.    [provider]  Cholecalciferol (VITAMIN D3 PO) Take 1 capsule by mouth daily.    [provider]  diphenhydrAMINE (BENADRYL) 25 MG tablet Take 25 mg by mouth once.    [provider]  EPINEPHrine 0.3 mg/0.3 mL IJ SOAJ injection Inject 0.3 mLs (0.3 mgttotal) into the muscle for 1 dose. 08/18/21   Dettinger, Elige Radon, MD  ibuprofen (ADVIL) 800 MG tablet Take 1 tablet (800 mg total)  by mouth every 8 (eight) hours as needed. 03/27/21   Cornett, Maisie Fus, MD  levothyroxine (SYNTHROID) 150 MCG tablet Take 1 tablet (150 mcg total) by mouth daily. 07/17/22   Dettinger, Elige Radon, MD  montelukast (SINGULAIR) 10 MG tablet Take 1 tablet (10 mg total) by mouth at bedtime. 01/14/22   Dettinger, Elige Radon, MD  Multiple Vitamin (MULTIVITAMIN) tablet Take 1 tablet by mouth daily.    [provider]  promethazine-dextromethorphan (PROMETHAZINE-DM) 6.25-15 MG/5ML syrup Take 5 mLs by mouth 4 (four) times daily as needed for cough. 06/25/22   Leath-Warren, Sadie Haber, NP  triamcinolone (NASACORT) 55 MCG/ACT AERO nasal inhaler Place 2 sprays into the nose 2 (two) times daily.    [provider]  zinc gluconate 50 MG tablet Take 50 mg by mouth daily.    [provider]    Family History Family History  Problem Relation Age of Onset   Hypertension Mother    Lung disease Father     Social History Social History   Tobacco Use   Smoking status: Never   Smokeless tobacco: Never  Vaping Use   Vaping Use: Never used  Substance Use Topics   Alcohol use: No   Drug use: No     Allergies   Sulfa antibiotics, Bee venom, Fish allergy, and Ivp dye [iodinated contrast media]   Review of Systems Review of Systems Per HPI  Physical Exam Triage Vital Signs ED Triage Vitals [12/21/22 0815]  Enc Vitals Group     BP (!) 172/96     Pulse Rate 92     Resp 16     Temp 98.6 F (37 C)     Temp Source Oral     SpO2 95 %     Weight      Height      Head Circumference      Peak Flow      Pain Score 0     Pain Loc      Pain Edu?      Excl. in GC?    No data found.  Updated Vital Signs BP (!) 172/96 (BP Location: Right Arm)   Pulse 92   Temp 98.6 F (37 C) (Oral)   Resp 16   SpO2 95%   Visual Acuity Right Eye Distance:   Left Eye Distance:   Bilateral Distance:    Right Eye Near:   Left Eye Near:    Bilateral Near:     Physical Exam Vitals and  nursing note reviewed.  Constitutional:      General: She is not in acute distress.    Appearance: Normal appearance. She is not toxic-appearing.  HENT:     Head: Normocephalic and atraumatic.     Mouth/Throat:     Mouth: Mucous  membranes are moist.     Pharynx: Oropharynx is clear.     Comments: Oral airway is patent Pulmonary:     Effort: Pulmonary effort is normal. No respiratory distress.     Breath sounds: Normal breath sounds. No wheezing, rhonchi or rales.  Musculoskeletal:     Cervical back: Normal range of motion.  Lymphadenopathy:     Cervical: No cervical adenopathy.  Skin:    General: Skin is warm and dry.     Capillary Refill: Capillary refill takes less than 2 seconds.     Findings: Erythema and rash present.     Comments: Multiple erythematous nodules to posterior neck consistent with insect bites; there are multiple other erythematous nodules to face and bilateral upper extremities.  No surrounding erythema, warmth, fluctuance, active drainage.  Neurological:     Mental Status: She is alert and oriented to person, place, and time.  Psychiatric:        Behavior: Behavior is cooperative.      UC Treatments / Results  Labs (all labs ordered are listed, but only abnormal results are displayed) Labs Reviewed - No data to display  EKG   Radiology No results found.  Procedures Procedures (including critical care time)  Medications Ordered in UC Medications  dexamethasone (DECADRON) injection 10 mg (10 mg Intramuscular Given 12/21/22 0837)    Initial Impression / Assessment and Plan / UC Course  I have reviewed the triage vital signs and the nursing notes.  Pertinent labs & imaging results that were available during my care of the patient were reviewed by me and considered in my medical decision making (see chart for details).   Patient is well-appearing, afebrile, not tachycardic, not tachypneic, oxygenating well on room air.  Patient is mildly  hypertensive today in urgent care.  1. Rash and nonspecific skin eruption 2. Insect bite of other part of neck, initial encounter Treat with oral antihistamine regimen Decadron 10 mg IM given today in urgent care for itchy rash/inflammation Start triamcinolone ointment up to twice daily as needed for itching ER and return precautions discussed with patient  The patient was given the opportunity to ask questions.  All questions answered to their satisfaction.  The patient is in agreement to this plan.    Final Clinical Impressions(s) / UC Diagnoses   Final diagnoses:  Rash and nonspecific skin eruption  Insect bite of other part of neck, initial encounter     Discharge Instructions      We have given you a steroid shot today to help with inflammation.  Start taking an oral antihistamine (Zyrtec, Allegra, or Claritin) 2 pills twice daily to help with itching for the next few days.  You can continue Benadryl every 6 hours during the day as needed for itching.  You can also apply the triamcinolone ointment up to twice daily as needed for itching.  Seek care if symptoms persist or worsen despite treatment.     ED Prescriptions     Medication Sig Dispense Auth. Provider   triamcinolone ointment (KENALOG) 0.1 % Apply 1 Application topically 2 (two) times daily. 15 g Valentino Nose, NP      PDMP not reviewed this encounter.   Valentino Nose, NP 12/21/22 [phone]

## 2022-12-24 ENCOUNTER — Telehealth: Payer: Self-pay | Admitting: Hematology and Oncology

## 2022-12-24 NOTE — Telephone Encounter (Signed)
Patient is aware of rescheuled appointment times/dates

## 2023-01-04 ENCOUNTER — Ambulatory Visit: Payer: Medicare Other | Attending: Surgery

## 2023-01-04 VITALS — Wt 224.4 lb

## 2023-01-04 DIAGNOSIS — Z483 Aftercare following surgery for neoplasm: Secondary | ICD-10-CM | POA: Insufficient documentation

## 2023-01-04 NOTE — Therapy (Signed)
OUTPATIENT PHYSICAL THERAPY SOZO SCREENING NOTE   Patient Name: Laurie Mckenzie MRN: [id number] DOB:13-Dec-1950, 72 y.o., female Today's Date: 01/04/2023  PCP: Dettinger, Elige Radon, MD REFERRING PROVIDER: Harriette Bouillon, MD   PT End of Session - 01/04/23 [phone]     Visit Number 2   # unchanged due to screen only   PT Start Time 0807    PT Stop Time 0813    PT Time Calculation (min) 6 min    Activity Tolerance Patient tolerated treatment well    Behavior During Therapy [facility] for tasks assessed/performed             Past Medical History:  Diagnosis Date   Cancer (HCC)    Headache    Hypertension    186/106 today    195/108 07/27/2017   Hypothyroidism    Primary osteoarthritis of knee    Right   Past Surgical History:  Procedure Laterality Date   BREAST LUMPECTOMY WITH RADIOACTIVE SEED AND SENTINEL LYMPH NODE BIOPSY Left 03/27/2021   Procedure: LEFT BREAST LUMPECTOMY WITH RADIOACTIVE SEED AND SENTINEL LYMPH NODE BIOPSY;  Surgeon: Harriette Bouillon, MD;  Location: [facility];  Service: General;  Laterality: Left;   COLONOSCOPY N/A 01/16/2015   Procedure: COLONOSCOPY;  Surgeon: Corbin Ade, MD;  [facility];  Service: Endoscopy;  Laterality: N/A;  8:30 AM   KNEE ARTHROPLASTY Left 2011   [place]   TOTAL KNEE ARTHROPLASTY Right 08/23/2017   Procedure: TOTAL KNEE ARTHROPLASTY;  Surgeon: Salvatore Marvel, MD;  Location: [facility];  Service: Orthopedics;  Laterality: Right;   Patient Active Problem List   Diagnosis Date Noted   Osteopenia 11/27/2022   Hypertriglyceridemia 07/17/2022   Malignant neoplasm of upper-outer quadrant of left breast in female, estrogen receptor positive (HCC) 03/17/2021   Primary localized osteoarthritis of right knee 08/23/2017   Hypothyroidism    Hypertension    Chronic allergic rhinitis 03/18/2015   Obesity (BMI 30-39.9) 02/20/2014   Hyperlipidemia 12/25/2013    REFERRING DIAG: left breast cancer at risk for  lymphedema  THERAPY DIAG: Aftercare following surgery for neoplasm  PERTINENT HISTORY: Patient was diagnosed on 02/13/2021 with 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 She underwent a left lumpectomy and sentinel node biopsy on 03/27/2021 with 2 negative nodes removed. It is ER/PR positive and HER2 negative with a Ki67 of 15%  PRECAUTIONS: left UE Lymphedema risk, None  SUBJECTIVE: Pt returns for her 3 month L-Dex screen.    PAIN:  Are you having pain? No  SOZO SCREENING: Patient was assessed today using the SOZO machine to determine the lymphedema index score. This was compared to her baseline score. It was determined that she is within the recommended range when compared to her baseline and no further action is needed at this time. She will continue SOZO screenings. These are done every 3 months for 2 years post operatively followed by every 6 months for 2 years, and then annually.   L-DEX FLOWSHEETS - 01/04/23 0800       L-DEX LYMPHEDEMA SCREENING   Measurement Type Unilateral    L-DEX MEASUREMENT EXTREMITY Upper Extremity    POSITION  Standing    DOMINANT SIDE Right    At Risk Side Left    BASELINE SCORE (UNILATERAL) 2.5    L-DEX SCORE (UNILATERAL) 4.6    VALUE CHANGE (UNILAT) 2.1            P: Begin 6 month L-Dex screens next.  Hermenia Bers, PTA 01/04/2023, 8:13 AM

## 2023-01-11 ENCOUNTER — Telehealth: Payer: Self-pay | Admitting: Family Medicine

## 2023-01-12 ENCOUNTER — Other Ambulatory Visit: Payer: Self-pay

## 2023-01-12 ENCOUNTER — Other Ambulatory Visit: Payer: Medicare Other

## 2023-01-12 DIAGNOSIS — E782 Mixed hyperlipidemia: Secondary | ICD-10-CM | POA: Diagnosis not present

## 2023-01-12 DIAGNOSIS — E039 Hypothyroidism, unspecified: Secondary | ICD-10-CM

## 2023-01-12 DIAGNOSIS — E559 Vitamin D deficiency, unspecified: Secondary | ICD-10-CM | POA: Diagnosis not present

## 2023-01-12 DIAGNOSIS — I10 Essential (primary) hypertension: Secondary | ICD-10-CM

## 2023-01-12 LAB — CBC WITH DIFFERENTIAL/PLATELET
EOS (ABSOLUTE): 0.3 10*3/uL (ref 0.0–0.4)
Lymphocytes Absolute: 1.7 10*3/uL (ref 0.7–3.1)
Neutrophils Absolute: 4.9 10*3/uL (ref 1.4–7.0)
RDW: 13.1 % (ref 11.7–15.4)

## 2023-01-12 LAB — CMP14+EGFR

## 2023-01-12 LAB — LIPID PANEL

## 2023-01-13 LAB — LIPID PANEL
Cholesterol, Total: 147 mg/dL (ref 100–199)
HDL: 38 mg/dL — ABNORMAL LOW (ref >39)
LDL Chol Calc (NIH): 58 mg/dL (ref 0–99)
Triglycerides: 329 mg/dL — ABNORMAL HIGH (ref 0–149)
VLDL Cholesterol Cal: 51 mg/dL — ABNORMAL HIGH (ref 5–40)

## 2023-01-13 LAB — CBC WITH DIFFERENTIAL/PLATELET
Basophils Absolute: 0.1 10*3/uL (ref 0.0–0.2)
Basos: 1 %
Eos: 4 %
Hematocrit: 45.3 % (ref 34.0–46.6)
Hemoglobin: 14.9 g/dL (ref 11.1–15.9)
Immature Grans (Abs): 0.1 10*3/uL (ref 0.0–0.1)
Immature Granulocytes: 1 %
Lymphs: 22 %
MCH: 29.9 pg (ref 26.6–33.0)
MCHC: 32.9 g/dL (ref 31.5–35.7)
MCV: 91 fL (ref 79–97)
Monocytes Absolute: 0.7 10*3/uL (ref 0.1–0.9)
Monocytes: 9 %
Neutrophils: 63 %
Platelets: 255 10*3/uL (ref 150–450)
RBC: 4.98 x10E6/uL (ref 3.77–5.28)
WBC: 7.8 10*3/uL (ref 3.4–10.8)

## 2023-01-13 LAB — CMP14+EGFR
ALT: 39 IU/L — ABNORMAL HIGH (ref 0–32)
Albumin: 4.6 g/dL (ref 3.8–4.8)
Alkaline Phosphatase: 131 IU/L — ABNORMAL HIGH (ref 44–121)
BUN/Creatinine Ratio: 18 (ref 12–28)
BUN: 14 mg/dL (ref 8–27)
CO2: 25 mmol/L (ref 20–29)
Chloride: 95 mmol/L — ABNORMAL LOW (ref 96–106)
Creatinine, Ser: 0.77 mg/dL (ref 0.57–1.00)
Glucose: 116 mg/dL — ABNORMAL HIGH (ref 70–99)
Potassium: 4.5 mmol/L (ref 3.5–5.2)
Sodium: 137 mmol/L (ref 134–144)
eGFR: 82 mL/min/{1.73_m2} (ref >59)

## 2023-01-13 LAB — VITAMIN D 25 HYDROXY (VIT D DEFICIENCY, FRACTURES): Vit D, 25-Hydroxy: 56.3 ng/mL (ref 30.0–100.0)

## 2023-01-13 LAB — TSH: TSH: 1.75 u[IU]/mL (ref 0.450–4.500)

## 2023-01-14 ENCOUNTER — Ambulatory Visit: Payer: Medicare Other | Admitting: Family Medicine

## 2023-01-18 ENCOUNTER — Other Ambulatory Visit: Payer: Self-pay | Admitting: Family Medicine

## 2023-01-18 MED ORDER — FENOFIBRATE 48 MG PO TABS: 48.0000 mg | ORAL_TABLET | Freq: Every day | ORAL | 3 refills | Status: DC

## 2023-01-18 NOTE — Progress Notes (Signed)
Dr. Louanne Skye has sent in Fenofibrate. Pt made aware.

## 2023-02-01 ENCOUNTER — Encounter: Payer: Self-pay | Admitting: Family Medicine

## 2023-02-01 ENCOUNTER — Ambulatory Visit (INDEPENDENT_AMBULATORY_CARE_PROVIDER_SITE_OTHER): Payer: Medicare Other | Admitting: Family Medicine

## 2023-02-01 VITALS — BP 155/80 | HR 86 | Ht 64.0 in | Wt 223.0 lb

## 2023-02-01 DIAGNOSIS — E781 Pure hyperglyceridemia: Secondary | ICD-10-CM

## 2023-02-01 DIAGNOSIS — I10 Essential (primary) hypertension: Secondary | ICD-10-CM

## 2023-02-01 DIAGNOSIS — E039 Hypothyroidism, unspecified: Secondary | ICD-10-CM

## 2023-02-01 DIAGNOSIS — E782 Mixed hyperlipidemia: Secondary | ICD-10-CM | POA: Diagnosis not present

## 2023-02-01 NOTE — Progress Notes (Signed)
BP (!) 155/80   Pulse 86   Ht 5\' 4"  (1.626 m)   Wt 223 lb (101.2 kg)   SpO2 96%   BMI 38.28 kg/m    Subjective:   Patient ID: Laurie Mckenzie, female    DOB: 1950/08/22, 72 y.o.   MRN: [id number]  HPI: Laurie Mckenzie is a 72 y.o. female presenting on 02/01/2023 for Medical Management of Chronic Issues, Hypertension, and Hypothyroidism   HPI Hypothyroidism recheck Patient is coming in for thyroid recheck today as well. They deny any issues with hair changes or heat or cold problems or diarrhea or constipation. They deny any chest pain or palpitations. They are currently on levothyroxine 150 micrograms   Hypertension Patient is currently on amlodipine, and their blood pressure today is 155/80. Patient denies any lightheadedness or dizziness. Patient denies headaches, blurred vision, chest pains, shortness of breath, or weakness. Denies any side effects from medication and is content with current medication.   Hyperlipidemia and hypertriglyceridemia recheck Patient is coming in for recheck of his hyperlipidemia. The patient is currently taking atorvastatin and fenofibrate, just started the fenofibrate.. They deny any issues with myalgias or history of liver damage from it. They deny any focal numbness or weakness or chest pain.   Relevant past medical, surgical, family and social history reviewed and updated as indicated. Interim medical history since our last visit reviewed. Allergies and medications reviewed and updated.  Review of Systems  Constitutional:  Negative for chills and fever.  Eyes:  Negative for visual disturbance.  Respiratory:  Negative for chest tightness and shortness of breath.   Cardiovascular:  Negative for chest pain and leg swelling.  Musculoskeletal:  Negative for back pain and gait problem.  Skin:  Negative for rash.  Neurological:  Negative for dizziness, light-headedness and headaches.  Psychiatric/Behavioral:  Negative for agitation and behavioral problems.    All other systems reviewed and are negative.   Per HPI unless specifically indicated above   Allergies as of 02/01/2023       Reactions   Sulfa Antibiotics Other (See Comments)   Bee Venom Swelling, Other (See Comments)   Red and hot to site   Fish Allergy Hives   Ivp Dye [iodinated Contrast Media] Other (See Comments)   MD advised not to use, due to fish allergies        Medication List        Accurate as of February 01, 2023  9:12 AM. If you have any questions, ask your nurse or doctor.          acetaminophen 500 MG tablet Commonly known as: TYLENOL Take 1,000-1,500 mg by mouth every 6 (six) hours as needed for moderate pain or headache.   amLODipine 5 MG tablet Commonly known as: NORVASC Take 1 tablet (5 mg total) by mouth daily.   anastrozole 1 MG tablet Commonly known as: ARIMIDEX Take 1 tablet (1 mg total) by mouth daily.   atorvastatin 20 MG tablet Commonly known as: LIPITOR Take 1 tablet (20 mg total) by mouth daily.   calcium carbonate 1500 (600 Ca) MG Tabs tablet Commonly known as: OSCAL Take 600 mg by mouth 2 (two) times daily with a meal.   diphenhydrAMINE 25 MG tablet Commonly known as: BENADRYL Take 25 mg by mouth once.   EPINEPHrine 0.3 mg/0.3 mL Soaj injection Commonly known as: EPI-PEN Inject 0.3 mLs (0.3 mgttotal) into the muscle for 1 dose.   fenofibrate 48 MG tablet Commonly known as: Tricor Take  1 tablet (48 mg total) by mouth daily.   ibuprofen 800 MG tablet Commonly known as: ADVIL Take 1 tablet (800 mg total) by mouth every 8 (eight) hours as needed.   levothyroxine 150 MCG tablet Commonly known as: SYNTHROID Take 1 tablet (150 mcg total) by mouth daily.   montelukast 10 MG tablet Commonly known as: SINGULAIR Take 1 tablet (10 mg total) by mouth at bedtime.   multivitamin tablet Take 1 tablet by mouth daily.   promethazine-dextromethorphan 6.25-15 MG/5ML syrup Commonly known as: PROMETHAZINE-DM Take 5 mLs by mouth 4  (four) times daily as needed for cough.   triamcinolone 55 MCG/ACT Aero nasal inhaler Commonly known as: NASACORT Place 2 sprays into the nose 2 (two) times daily.   triamcinolone ointment 0.1 % Commonly known as: KENALOG Apply 1 Application topically 2 (two) times daily.   vitamin C 100 MG tablet Take 100 mg by mouth daily.   VITAMIN D3 PO Take 1 capsule by mouth daily.   zinc gluconate 50 MG tablet Take 50 mg by mouth daily.         Objective:   BP (!) 155/80   Pulse 86   Ht 5\' 4"  (1.626 m)   Wt 223 lb (101.2 kg)   SpO2 96%   BMI 38.28 kg/m   Wt Readings from Last 3 Encounters:  02/01/23 223 lb (101.2 kg)  01/04/23 224 lb 6 oz (101.8 kg)  12/11/22 221 lb (100.2 kg)    Physical Exam Vitals and nursing note reviewed.  Constitutional:      General: She is not in acute distress.    Appearance: She is well-developed. She is not diaphoretic.  Eyes:     Conjunctiva/sclera: Conjunctivae normal.  Cardiovascular:     Rate and Rhythm: Normal rate and regular rhythm.     Heart sounds: Normal heart sounds. No murmur heard. Pulmonary:     Effort: Pulmonary effort is normal. No respiratory distress.     Breath sounds: Normal breath sounds. No wheezing.  Musculoskeletal:        General: No tenderness. Normal range of motion.  Skin:    General: Skin is warm and dry.     Findings: No rash.  Neurological:     Mental Status: She is alert and oriented to person, place, and time.     Coordination: Coordination normal.  Psychiatric:        Behavior: Behavior normal.     Results for orders placed or performed in visit on 01/12/23  VITAMIN D 25 Hydroxy (Vit-D Deficiency, Fractures)  Result Value Ref Range   Vit D, 25-Hydroxy 56.3 30.0 - 100.0 ng/mL  CBC with Differential/Platelet  Result Value Ref Range   WBC 7.8 3.4 - 10.8 x10E3/uL   RBC 4.98 3.77 - 5.28 x10E6/uL   Hemoglobin 14.9 11.1 - 15.9 g/dL   Hematocrit 62.1 30.8 - 46.6 %   MCV 91 79 - 97 fL   MCH 29.9 26.6  - 33.0 pg   MCHC 32.9 31.5 - 35.7 g/dL   RDW 65.7 84.6 - 96.2 %   Platelets 255 150 - 450 x10E3/uL   Neutrophils 63 Not Estab. %   Lymphs 22 Not Estab. %   Monocytes 9 Not Estab. %   Eos 4 Not Estab. %   Basos 1 Not Estab. %   Neutrophils Absolute 4.9 1.4 - 7.0 x10E3/uL   Lymphocytes Absolute 1.7 0.7 - 3.1 x10E3/uL   Monocytes Absolute 0.7 0.1 - 0.9 x10E3/uL   EOS (  ABSOLUTE) 0.3 0.0 - 0.4 x10E3/uL   Basophils Absolute 0.1 0.0 - 0.2 x10E3/uL   Immature Granulocytes 1 Not Estab. %   Immature Grans (Abs) 0.1 0.0 - 0.1 x10E3/uL  CMP14+EGFR  Result Value Ref Range   Glucose 116 (H) 70 - 99 mg/dL   BUN 14 8 - 27 mg/dL   Creatinine, Ser 2.44 0.57 - 1.00 mg/dL   eGFR 82 >01 UU/VOZ/3.66   BUN/Creatinine Ratio 18 12 - 28   Sodium 137 134 - 144 mmol/L   Potassium 4.5 3.5 - 5.2 mmol/L   Chloride 95 (L) 96 - 106 mmol/L   CO2 25 20 - 29 mmol/L   Calcium 10.1 8.7 - 10.3 mg/dL   Total Protein 7.4 6.0 - 8.5 g/dL   Albumin 4.6 3.8 - 4.8 g/dL   Globulin, Total 2.8 1.5 - 4.5 g/dL   Bilirubin Total 0.3 0.0 - 1.2 mg/dL   Alkaline Phosphatase 131 (H) 44 - 121 IU/L   AST 32 0 - 40 IU/L   ALT 39 (H) 0 - 32 IU/L  Lipid panel  Result Value Ref Range   Cholesterol, Total 147 100 - 199 mg/dL   Triglycerides 440 (H) 0 - 149 mg/dL   HDL 38 (L) >34 mg/dL   VLDL Cholesterol Cal 51 (H) 5 - 40 mg/dL   LDL Chol Calc (NIH) 58 0 - 99 mg/dL   Chol/HDL Ratio 3.9 0.0 - 4.4 ratio  TSH  Result Value Ref Range   TSH 1.750 0.450 - 4.500 uIU/mL    Assessment & Plan:   Problem List Items Addressed This Visit       Cardiovascular and Mediastinum   Hypertension     Endocrine   Hypothyroidism - Primary     Other   Hyperlipidemia   Hypertriglyceridemia    Continue current medicine, seems to be doing well.  We added the fenofibrate just a few days ago when we found about the blood work.  So we will see how that does for her next visit. Follow up plan: Return in about 6 months (around 08/04/2023), or if  symptoms worsen or fail to improve, for Hypertension and thyroid and cholesterol.  Counseling provided for all of the vaccine components No orders of the defined types were placed in this encounter.   Arville Care, MD [facility] 02/01/2023, 9:12 AM

## 2023-02-10 ENCOUNTER — Ambulatory Visit: Payer: Medicare Other | Admitting: Hematology and Oncology

## 2023-02-10 ENCOUNTER — Other Ambulatory Visit: Payer: Medicare Other

## 2023-02-10 ENCOUNTER — Ambulatory Visit: Payer: Medicare Other

## 2023-02-10 DIAGNOSIS — M85859 Other specified disorders of bone density and structure, unspecified thigh: Secondary | ICD-10-CM

## 2023-02-15 ENCOUNTER — Encounter: Payer: Self-pay | Admitting: Hematology and Oncology

## 2023-02-15 NOTE — Progress Notes (Signed)
Erroneous encounter

## 2023-02-25 ENCOUNTER — Other Ambulatory Visit: Payer: Self-pay | Admitting: *Deleted

## 2023-02-25 ENCOUNTER — Inpatient Hospital Stay: Payer: Medicare Other | Admitting: Hematology and Oncology

## 2023-02-25 ENCOUNTER — Inpatient Hospital Stay: Payer: Medicare Other | Attending: Hematology and Oncology

## 2023-02-25 ENCOUNTER — Inpatient Hospital Stay: Payer: Medicare Other

## 2023-02-25 VITALS — BP 146/67 | HR 91 | Temp 98.1°F | Resp 17 | Wt 228.0 lb

## 2023-02-25 VITALS — BP 154/76 | HR 82 | Resp 16

## 2023-02-25 DIAGNOSIS — M85859 Other specified disorders of bone density and structure, unspecified thigh: Secondary | ICD-10-CM

## 2023-02-25 DIAGNOSIS — M8580 Other specified disorders of bone density and structure, unspecified site: Secondary | ICD-10-CM | POA: Diagnosis not present

## 2023-02-25 DIAGNOSIS — Z79899 Other long term (current) drug therapy: Secondary | ICD-10-CM | POA: Diagnosis not present

## 2023-02-25 DIAGNOSIS — Z170 Estrogen receptor positive status [ER+]: Secondary | ICD-10-CM

## 2023-02-25 DIAGNOSIS — C50412 Malignant neoplasm of upper-outer quadrant of left female breast: Secondary | ICD-10-CM | POA: Diagnosis not present

## 2023-02-25 DIAGNOSIS — R232 Flushing: Secondary | ICD-10-CM | POA: Insufficient documentation

## 2023-02-25 DIAGNOSIS — Z923 Personal history of irradiation: Secondary | ICD-10-CM | POA: Insufficient documentation

## 2023-02-25 DIAGNOSIS — Z79811 Long term (current) use of aromatase inhibitors: Secondary | ICD-10-CM | POA: Diagnosis not present

## 2023-02-25 LAB — CMP (CANCER CENTER ONLY)
ALT: 35 U/L (ref 0–44)
AST: 24 U/L (ref 15–41)
Albumin: 4.4 g/dL (ref 3.5–5.0)
Alkaline Phosphatase: 80 U/L (ref 38–126)
Anion gap: 8 (ref 5–15)
BUN: 17 mg/dL (ref 8–23)
CO2: 28 mmol/L (ref 22–32)
Calcium: 9.8 mg/dL (ref 8.9–10.3)
Chloride: 103 mmol/L (ref 98–111)
Creatinine: 0.81 mg/dL (ref 0.44–1.00)
GFR, Estimated: >60 mL/min (ref >60)
Glucose, Bld: 177 mg/dL — ABNORMAL HIGH (ref 70–99)
Potassium: 3.6 mmol/L (ref 3.5–5.1)
Sodium: 139 mmol/L (ref 135–145)
Total Bilirubin: 0.3 mg/dL (ref 0.3–1.2)
Total Protein: 7.5 g/dL (ref 6.5–8.1)

## 2023-02-25 MED ORDER — SODIUM CHLORIDE 0.9 % IV SOLN: Freq: Once | INTRAVENOUS | Status: AC

## 2023-02-25 MED ORDER — ZOLEDRONIC ACID 4 MG/100ML IV SOLN
4.0000 mg | Freq: Once | INTRAVENOUS | Status: AC
  Administered 2023-02-25: 4 mg via INTRAVENOUS
  Filled 2023-02-25: qty 100

## 2023-02-25 NOTE — Patient Instructions (Signed)

## 2023-02-25 NOTE — Progress Notes (Signed)
ONCOLOGIC HISTORY:  Oncology History  Malignant neoplasm of upper-outer quadrant of left breast in female, estrogen receptor positive (HCC)  03/12/2021 Initial Diagnosis   status post left breast upper outer quadrant biopsy 03/12/2021 for a clinical T1c N0, stage IA invasive ductal carcinoma, grade 2, estrogen and progesterone receptor positive, HER2 not amplified (FISH results pending) with an MIB-1 of 30%   03/19/2021 Cancer Staging   Staging form: Breast, AJCC 8th Edition - Clinical stage from 03/19/2021: Stage IA (cT1c, cN0, cM0, G2, ER+, PR+, HER2-) - Signed by Lowella Dell, MD on 03/19/2021 Stage prefix: Initial diagnosis Histologic grading system: 3 grade system Laterality: Left Staged by: Pathologist and managing physician Stage used in treatment planning: Yes National guidelines used in treatment planning: Yes Type of national guideline used in treatment planning: NCCN   03/27/2021 Cancer Staging   Staging form: Breast, AJCC 8th Edition - Pathologic stage from 03/27/2021: Stage IA (pT1b, pN0, cM0, G3, ER+, PR+, HER2-) - Signed by Loa Socks, NP on 07/19/2021 Histologic grading system: 3 grade system   03/27/2021 Surgery   status post left lumpectomy and sentinel lymph node sampling 03/27/2021 for a pT1b pN0, stage IA invasive ductal carcinoma, grade 3, with negative margins.             (a) 2 left axillary lymph nodes were removed     03/27/2021 Oncotype testing   Oncotype score of 24 predicts a risk of recurrence outside the breast over the next 9 years of 10% if the patient's only systemic therapy is antiestrogens for 5 years.  It also predicts minimal benefit from adjuvant chemotherapy   05/01/2021 - 06/03/2021 Radiation Therapy   05/01/2021 through 06/03/2021 Site Technique Total Dose (Gy) Dose per Fx (Gy) Completed Fx Beam Energies  Breast, Left: Breast_Lt 3D 42.56/42.56 2.66 16/16 10X  Breast, Left: Breast_Lt_Bst 3D 8/8 2 4/4 6X, 10X     05/29/2021 -   Anti-estrogen oral therapy   Anastrozole daily     INTERVAL HISTORY:   She is taking anastrozole daily with good tolerance.   She has been noting intermittent hot flashes. Since her last visit here and she is now scheduled for Zometa, she had dental clearance obtained.  However she was not quite sure why she is getting the infusion hence wanted to talk about it today.  She otherwise denies any changes in her breast.  She will be due for another mammogram in October but is not sure if this has already been scheduled. Rest of the pertinent 10 point ROS reviewed and negative.  REVIEW OF SYSTEMS:  Review of Systems  Constitutional:  Negative for appetite change, chills, fatigue, fever and unexpected weight change.  HENT:   Negative for hearing loss, lump/mass and trouble swallowing.   Eyes:  Negative for eye problems and icterus.  Respiratory:  Negative for chest tightness, cough and shortness of breath.   Cardiovascular:  Negative for chest pain, leg swelling and palpitations.  Gastrointestinal:  Negative for abdominal distention, abdominal pain, constipation, diarrhea, nausea and vomiting.  Endocrine: Positive for hot flashes.  Genitourinary:  Negative for difficulty urinating.   Musculoskeletal:  Negative for arthralgias.  Skin:  Negative for itching and rash.  Neurological:  Negative for dizziness, extremity weakness, headaches and numbness.  Hematological:  Negative for adenopathy. Does not bruise/bleed easily.  Psychiatric/Behavioral:  Negative for depression. The patient is not nervous/anxious.    Breast: Denies any new nodularity, masses, tenderness, nipple changes, or nipple discharge.   ONCOLOGY TREATMENT  TEAM:  1. Surgeon:  Dr. Luisa Hart at [facility] 2. Medical Oncologist: Dr. Al Pimple  3. Radiation Oncologist: Dr. Mitzi Hansen    PAST MEDICAL/SURGICAL HISTORY:  Past Medical History:  Diagnosis Date   Cancer [facility])    Headache    Hypertension    186/106 today     195/108 07/27/2017   Hypothyroidism    Primary osteoarthritis of knee    Right   Past Surgical History:  Procedure Laterality Date   BREAST LUMPECTOMY WITH RADIOACTIVE SEED AND SENTINEL LYMPH NODE BIOPSY Left 03/27/2021   Procedure: LEFT BREAST LUMPECTOMY WITH RADIOACTIVE SEED AND SENTINEL LYMPH NODE BIOPSY;  Surgeon: Harriette Bouillon, MD;  Location: [facility];  Service: General;  Laterality: Left;   COLONOSCOPY N/A 01/16/2015   Procedure: COLONOSCOPY;  Surgeon: Corbin Ade, MD;  [facility];  Service: Endoscopy;  Laterality: N/A;  8:30 AM   KNEE ARTHROPLASTY Left 2011   [place]   TOTAL KNEE ARTHROPLASTY Right 08/23/2017   Procedure: TOTAL KNEE ARTHROPLASTY;  Surgeon: Salvatore Marvel, MD;  Location: [facility] OR;  Service: Orthopedics;  Laterality: Right;     ALLERGIES:  Allergies  Allergen Reactions   Sulfa Antibiotics Other (See Comments)   Bee Venom Swelling and Other (See Comments)    Red and hot to site   Fish Allergy Hives   Ivp Dye [Iodinated Contrast Media] Other (See Comments)    MD advised not to use, due to fish allergies     CURRENT MEDICATIONS:  Outpatient Encounter Medications as of 02/25/2023  Medication Sig Note   acetaminophen (TYLENOL) 500 MG tablet Take 1,000-1,500 mg by mouth every 6 (six) hours as needed for moderate pain or headache.     amLODipine (NORVASC) 5 MG tablet Take 1 tablet (5 mg total) by mouth daily.    anastrozole (ARIMIDEX) 1 MG tablet Take 1 tablet (1 mg total) by mouth daily.    Ascorbic Acid (VITAMIN C) 100 MG tablet Take 100 mg by mouth daily.    atorvastatin (LIPITOR) 20 MG tablet Take 1 tablet (20 mg total) by mouth daily.    calcium carbonate (OSCAL) 1500 (600 Ca) MG TABS tablet Take 600 mg by mouth 2 (two) times daily with a meal.    Cholecalciferol (VITAMIN D3 PO) Take 1 capsule by mouth daily. 12/09/2021: 1000 IU   diphenhydrAMINE (BENADRYL) 25 MG tablet Take 25 mg by mouth once.    EPINEPHrine 0.3 mg/0.3 mL  IJ SOAJ injection Inject 0.3 mLs (0.3 mgttotal) into the muscle for 1 dose.    fenofibrate (TRICOR) 48 MG tablet Take 1 tablet (48 mg total) by mouth daily.    ibuprofen (ADVIL) 800 MG tablet Take 1 tablet (800 mg total) by mouth every 8 (eight) hours as needed.    levothyroxine (SYNTHROID) 150 MCG tablet Take 1 tablet (150 mcg total) by mouth daily.    montelukast (SINGULAIR) 10 MG tablet Take 1 tablet (10 mg total) by mouth at bedtime.    Multiple Vitamin (MULTIVITAMIN) tablet Take 1 tablet by mouth daily.    promethazine-dextromethorphan (PROMETHAZINE-DM) 6.25-15 MG/5ML syrup Take 5 mLs by mouth 4 (four) times daily as needed for cough.    triamcinolone (NASACORT) 55 MCG/ACT AERO nasal inhaler Place 2 sprays into the nose 2 (two) times daily.    triamcinolone ointment (KENALOG) 0.1 % Apply 1 Application topically 2 (two) times daily.    zinc gluconate 50 MG tablet Take 50 mg by mouth daily.    No facility-administered  encounter medications on file as of 02/25/2023.     ONCOLOGIC FAMILY HISTORY:  Family History  Problem Relation Age of Onset   Hypertension Mother    Lung disease Father      GENETIC COUNSELING/TESTING: Not at this time  SOCIAL HISTORY:  Social History   Socioeconomic History   Marital status: Single    Spouse name: Not on file   Number of children: 0   Years of education: Not on file   Highest education level: Not on file  Occupational History   Occupation: retired    Comment: 2017  Tobacco Use   Smoking status: Never   Smokeless tobacco: Never  Vaping Use   Vaping status: Never Used  Substance and Sexual Activity   Alcohol use: No   Drug use: No   Sexual activity: Not Currently    Comment: not in relationship currently  Other Topics Concern   Not on file  Social History Narrative   Not on file   Social Determinants of Health   Financial Resource Strain: Low Risk  (12/11/2022)   Overall Financial Resource Strain (CARDIA)    Difficulty of Paying  Living Expenses: Not hard at all  Food Insecurity: No Food Insecurity (12/11/2022)   Hunger Vital Sign    Worried About Running Out of Food in the Last Year: Never true    Ran Out of Food in the Last Year: Never true  Transportation Needs: No Transportation Needs (12/11/2022)   PRAPARE - Administrator, Civil Service (Medical): No    Lack of Transportation (Non-Medical): No  Physical Activity: Insufficiently Active (12/11/2022)   Exercise Vital Sign    Days of Exercise per Week: 3 days    Minutes of Exercise per Session: 30 min  Stress: No Stress Concern Present (12/11/2022)   [employer] of Occupational Health - Occupational Stress Questionnaire    Feeling of Stress : Not at all  Social Connections: Moderately Isolated (12/11/2022)   Social Connection and Isolation Panel [NHANES]    Frequency of Communication with Friends and Family: More than three times a week    Frequency of Social Gatherings with Friends and Family: More than three times a week    Attends Religious Services: More than 4 times per year    Active Member of [employer] or Organizations: No    Attends Banker Meetings: Never    Marital Status: Never married  Intimate Partner Violence: Not At Risk (12/11/2022)   Humiliation, Afraid, Rape, and Kick questionnaire    Fear of Current or Ex-Partner: No    Emotionally Abused: No    Physically Abused: No    Sexually Abused: No     OBSERVATIONS/OBJECTIVE:  BP (!) 146/67 (BP Location: Right Arm, Patient Position: Sitting) Comment: MD notified  Pulse 91   Temp 98.1 F (36.7 C) (Oral)   Resp 17   Wt 228 lb (103.4 kg)   SpO2 97%   BMI 39.14 kg/m   Physical Exam Constitutional:      Appearance: Normal appearance.  Chest:     Comments: Bilateral breasts inspected. Post surgical changes left breast. No new masses or regional adenopathy Musculoskeletal:     Cervical back: Normal range of motion and neck supple. No rigidity.  Lymphadenopathy:      Cervical: No cervical adenopathy.  Neurological:     Mental Status: She is alert.    LABORATORY DATA:  None for this visit.  DIAGNOSTIC IMAGING:  None for this visit.  ASSESSMENT AND PLAN:    Ms.. Laurie Mckenzie is a pleasant 72 y.o. female with Stage IA left breast invasive ductal carcinoma, ER+/PR+/HER2-, diagnosed in 02/2021, treated with lumpectomy, adjuvant radiation therapy, and anti-estrogen therapy with Anastrozole beginning in 05/2021.   She is tolerating anastrozole well except for intermittent hot flashes. Anticipate anastrozole daily January 2028. Baseline bone density in December 2022 with osteopenia, since she is on concomitant anastrozole, we will proceed with Zometa every 6 months for management of osteopenia.  Dental clearance obtained.  Have also once again discussed the role of bisphosphonate in the setting of osteopenia and aromatase inhibitors as well as the side effects including but not limited to hypocalcemia, osteonecrosis of the jaw, flulike symptoms etc. No concerning findings on review of systems or physical exam. She is due for another mammogram in October, she is not sure if this has been scheduled. She will return to clinic in 6 months or sooner as needed.  Total encounter time: in face to face visit time, chart review, lab review, care coordination, and documentation of the encounter.    *Total Encounter Time as defined by the Centers for Medicare and Medicaid Services includes, in addition to the face-to-face time of a patient visit (documented in the note above) non-face-to-face time: obtaining and reviewing outside history, ordering and reviewing medications, tests or procedures, care coordination (communications with other health care professionals or caregivers) and documentation in the medical record.

## 2023-03-22 DIAGNOSIS — H35373 Puckering of macula, bilateral: Secondary | ICD-10-CM | POA: Diagnosis not present

## 2023-03-22 DIAGNOSIS — H524 Presbyopia: Secondary | ICD-10-CM | POA: Diagnosis not present

## 2023-03-25 ENCOUNTER — Other Ambulatory Visit: Payer: Self-pay | Admitting: Family Medicine

## 2023-03-29 ENCOUNTER — Other Ambulatory Visit: Payer: Self-pay | Admitting: Family Medicine

## 2023-04-13 DIAGNOSIS — N6321 Unspecified lump in the left breast, upper outer quadrant: Secondary | ICD-10-CM | POA: Diagnosis not present

## 2023-04-13 DIAGNOSIS — Z853 Personal history of malignant neoplasm of breast: Secondary | ICD-10-CM | POA: Diagnosis not present

## 2023-04-19 ENCOUNTER — Encounter: Payer: Self-pay | Admitting: Family Medicine

## 2023-04-23 ENCOUNTER — Ambulatory Visit: Payer: Self-pay | Admitting: Surgery

## 2023-04-23 DIAGNOSIS — N6321 Unspecified lump in the left breast, upper outer quadrant: Secondary | ICD-10-CM

## 2023-04-26 ENCOUNTER — Telehealth: Payer: Self-pay | Admitting: Hematology and Oncology

## 2023-04-26 NOTE — Telephone Encounter (Signed)
Spoke with patient confirming upcoming appointment  

## 2023-04-29 ENCOUNTER — Inpatient Hospital Stay: Payer: Medicare Other | Attending: Hematology and Oncology | Admitting: Hematology and Oncology

## 2023-04-29 ENCOUNTER — Encounter (HOSPITAL_BASED_OUTPATIENT_CLINIC_OR_DEPARTMENT_OTHER)
Admission: RE | Admit: 2023-04-29 | Discharge: 2023-04-29 | Disposition: A | Discharge status: Home / Self Care | Payer: Medicare Other | Source: Ambulatory Visit | Attending: Surgery | Admitting: Surgery

## 2023-04-29 ENCOUNTER — Other Ambulatory Visit: Payer: Self-pay

## 2023-04-29 ENCOUNTER — Encounter (HOSPITAL_BASED_OUTPATIENT_CLINIC_OR_DEPARTMENT_OTHER): Payer: Self-pay | Admitting: Surgery

## 2023-04-29 VITALS — BP 163/81 | HR 91 | Temp 98.1°F | Resp 16 | Wt 229.5 lb

## 2023-04-29 DIAGNOSIS — C50412 Malignant neoplasm of upper-outer quadrant of left female breast: Secondary | ICD-10-CM | POA: Insufficient documentation

## 2023-04-29 DIAGNOSIS — I10 Essential (primary) hypertension: Secondary | ICD-10-CM | POA: Insufficient documentation

## 2023-04-29 DIAGNOSIS — Z923 Personal history of irradiation: Secondary | ICD-10-CM | POA: Diagnosis not present

## 2023-04-29 DIAGNOSIS — Z170 Estrogen receptor positive status [ER+]: Secondary | ICD-10-CM | POA: Diagnosis not present

## 2023-04-29 DIAGNOSIS — Z79811 Long term (current) use of aromatase inhibitors: Secondary | ICD-10-CM | POA: Diagnosis not present

## 2023-04-29 DIAGNOSIS — Z01810 Encounter for preprocedural cardiovascular examination: Secondary | ICD-10-CM | POA: Insufficient documentation

## 2023-04-29 DIAGNOSIS — N63 Unspecified lump in unspecified breast: Secondary | ICD-10-CM | POA: Diagnosis not present

## 2023-04-29 MED ORDER — CHLORHEXIDINE GLUCONATE CLOTH 2 % EX PADS: 6.0000 | MEDICATED_PAD | Freq: Once | CUTANEOUS | Status: DC

## 2023-04-29 NOTE — Progress Notes (Signed)
ONCOLOGIC HISTORY:  Oncology History  Malignant neoplasm of upper-outer quadrant of left breast in female, estrogen receptor positive (HCC)  03/12/2021 Initial Diagnosis   status post left breast upper outer quadrant biopsy 03/12/2021 for a clinical T1c N0, stage IA invasive ductal carcinoma, grade 2, estrogen and progesterone receptor positive, HER2 not amplified (FISH results pending) with an MIB-1 of 30%   03/19/2021 Cancer Staging   Staging form: Breast, AJCC 8th Edition - Clinical stage from 03/19/2021: Stage IA (cT1c, cN0, cM0, G2, ER+, PR+, HER2-) - Signed by Lowella Dell, MD on 03/19/2021 Stage prefix: Initial diagnosis Histologic grading system: 3 grade system Laterality: Left Staged by: Pathologist and managing physician Stage used in treatment planning: Yes National guidelines used in treatment planning: Yes Type of national guideline used in treatment planning: NCCN   03/27/2021 Cancer Staging   Staging form: Breast, AJCC 8th Edition - Pathologic stage from 03/27/2021: Stage IA (pT1b, pN0, cM0, G3, ER+, PR+, HER2-) - Signed by Loa Socks, NP on 07/19/2021 Histologic grading system: 3 grade system   03/27/2021 Surgery   status post left lumpectomy and sentinel lymph node sampling 03/27/2021 for a pT1b pN0, stage IA invasive ductal carcinoma, grade 3, with negative margins.             (a) 2 left axillary lymph nodes were removed     03/27/2021 Oncotype testing   Oncotype score of 24 predicts a risk of recurrence outside the breast over the next 9 years of 10% if the patient's only systemic therapy is antiestrogens for 5 years.  It also predicts minimal benefit from adjuvant chemotherapy   05/01/2021 - 06/03/2021 Radiation Therapy   05/01/2021 through 06/03/2021 Site Technique Total Dose (Gy) Dose per Fx (Gy) Completed Fx Beam Energies  Breast, Left: Breast_Lt 3D 42.56/42.56 2.66 16/16 10X  Breast, Left: Breast_Lt_Bst 3D 8/8 2 4/4 6X, 10X     05/29/2021 -   Anti-estrogen oral therapy   Anastrozole daily     INTERVAL HISTORY:   She is taking anastrozole daily with good tolerance.   She had a mammogram and ultrasound recently which showed a high density breast mass in the left breast, deemed indeterminate measuring up to 1.2 cm in its largest dimension. Ultrasound of the left breast confirmed 1.1 cm mass in the left breast suspicious for malignancy.  She already followed up with Dr. Luisa Hart and is scheduled for surgery on Wednesday Rest of the pertinent 10 point ROS reviewed and negative.  REVIEW OF SYSTEMS:  Review of Systems  Constitutional:  Negative for appetite change, chills, fatigue, fever and unexpected weight change.  HENT:   Negative for hearing loss, lump/mass and trouble swallowing.   Eyes:  Negative for eye problems and icterus.  Respiratory:  Negative for chest tightness, cough and shortness of breath.   Cardiovascular:  Negative for chest pain, leg swelling and palpitations.  Gastrointestinal:  Negative for abdominal distention, abdominal pain, constipation, diarrhea, nausea and vomiting.  Endocrine: Positive for hot flashes.  Genitourinary:  Negative for difficulty urinating.   Musculoskeletal:  Negative for arthralgias.  Skin:  Negative for itching and rash.  Neurological:  Negative for dizziness, extremity weakness, headaches and numbness.  Hematological:  Negative for adenopathy. Does not bruise/bleed easily.  Psychiatric/Behavioral:  Negative for depression. The patient is not nervous/anxious.    Breast: Denies any new nodularity, masses, tenderness, nipple changes, or nipple discharge.   ONCOLOGY TREATMENT TEAM:  1. Surgeon:  Dr. Luisa Hart at [facility] 2. Medical  Oncologist: Dr. Al Pimple  3. Radiation Oncologist: Dr. Mitzi Hansen    PAST MEDICAL/SURGICAL HISTORY:  Past Medical History:  Diagnosis Date   Cancer [facility])    Headache    Hypertension    186/106 today    195/108 07/27/2017   Hypothyroidism     Primary osteoarthritis of knee    Right   Past Surgical History:  Procedure Laterality Date   BREAST LUMPECTOMY WITH RADIOACTIVE SEED AND SENTINEL LYMPH NODE BIOPSY Left 03/27/2021   Procedure: LEFT BREAST LUMPECTOMY WITH RADIOACTIVE SEED AND SENTINEL LYMPH NODE BIOPSY;  Surgeon: Harriette Bouillon, MD;  Location: [facility];  Service: General;  Laterality: Left;   COLONOSCOPY N/A 01/16/2015   Procedure: COLONOSCOPY;  Surgeon: Corbin Ade, MD;  [facility];  Service: Endoscopy;  Laterality: N/A;  8:30 AM   KNEE ARTHROPLASTY Left 2011   [place]   TOTAL KNEE ARTHROPLASTY Right 08/23/2017   Procedure: TOTAL KNEE ARTHROPLASTY;  Surgeon: Salvatore Marvel, MD;  Location: [facility];  Service: Orthopedics;  Laterality: Right;     ALLERGIES:  Allergies  Allergen Reactions   Sulfa Antibiotics Other (See Comments)   Bee Venom Swelling and Other (See Comments)    Red and hot to site   Fish Allergy Hives   Ivp Dye [Iodinated Contrast Media] Other (See Comments)    MD advised not to use, due to fish allergies     CURRENT MEDICATIONS:  Outpatient Encounter Medications as of 04/29/2023  Medication Sig Note   acetaminophen (TYLENOL) 500 MG tablet Take 1,000-1,500 mg by mouth every 6 (six) hours as needed for moderate pain or headache.     amLODipine (NORVASC) 5 MG tablet Take 1 tablet (5 mg total) by mouth daily.    anastrozole (ARIMIDEX) 1 MG tablet Take 1 tablet (1 mg total) by mouth daily.    Ascorbic Acid (VITAMIN C) 100 MG tablet Take 100 mg by mouth daily.    atorvastatin (LIPITOR) 20 MG tablet Take 1 tablet (20 mg total) by mouth daily.    calcium carbonate (OSCAL) 1500 (600 Ca) MG TABS tablet Take 600 mg by mouth 2 (two) times daily with a meal.    Cholecalciferol (VITAMIN D3 PO) Take 1 capsule by mouth daily. 12/09/2021: 1000 IU   diphenhydrAMINE (BENADRYL) 25 MG tablet Take 25 mg by mouth once.    EPINEPHRINE 0.3 mg/0.3 mL IJ SOAJ injection Inject 0.3 mLs (0.3  mgttotal) into the muscle for 1 dose.    fenofibrate (TRICOR) 48 MG tablet Take 1 tablet (48 mg total) by mouth daily.    ibuprofen (ADVIL) 800 MG tablet Take 1 tablet (800 mg total) by mouth every 8 (eight) hours as needed.    levothyroxine (SYNTHROID) 150 MCG tablet Take 1 tablet (150 mcg total) by mouth daily.    montelukast (SINGULAIR) 10 MG tablet TAKE ONE TABLET BY MOUTH AT BEDTIME    Multiple Vitamin (MULTIVITAMIN) tablet Take 1 tablet by mouth daily.    promethazine-dextromethorphan (PROMETHAZINE-DM) 6.25-15 MG/5ML syrup Take 5 mLs by mouth 4 (four) times daily as needed for cough.    triamcinolone (NASACORT) 55 MCG/ACT AERO nasal inhaler Place 2 sprays into the nose 2 (two) times daily.    triamcinolone ointment (KENALOG) 0.1 % Apply 1 Application topically 2 (two) times daily.    zinc gluconate 50 MG tablet Take 50 mg by mouth daily.    No facility-administered encounter medications on file as of 04/29/2023.     ONCOLOGIC FAMILY HISTORY:  Family  History  Problem Relation Age of Onset   Hypertension Mother    Lung disease Father      GENETIC COUNSELING/TESTING: Not at this time  SOCIAL HISTORY:  Social History   Socioeconomic History   Marital status: Single    Spouse name: Not on file   Number of children: 0   Years of education: Not on file   Highest education level: Not on file  Occupational History   Occupation: retired    Comment: 2017  Tobacco Use   Smoking status: Never   Smokeless tobacco: Never  Vaping Use   Vaping status: Never Used  Substance and Sexual Activity   Alcohol use: No   Drug use: No   Sexual activity: Not Currently    Comment: not in relationship currently  Other Topics Concern   Not on file  Social History Narrative   Not on file   Social Determinants of Health   Financial Resource Strain: Low Risk  (12/11/2022)   Overall Financial Resource Strain (CARDIA)    Difficulty of Paying Living Expenses: Not hard at all  Food  Insecurity: No Food Insecurity (12/11/2022)   Hunger Vital Sign    Worried About Running Out of Food in the Last Year: Never true    Ran Out of Food in the Last Year: Never true  Transportation Needs: No Transportation Needs (12/11/2022)   PRAPARE - Administrator, Civil Service (Medical): No    Lack of Transportation (Non-Medical): No  Physical Activity: Insufficiently Active (12/11/2022)   Exercise Vital Sign    Days of Exercise per Week: 3 days    Minutes of Exercise per Session: 30 min  Stress: No Stress Concern Present (12/11/2022)   [employer] of Occupational Health - Occupational Stress Questionnaire    Feeling of Stress : Not at all  Social Connections: Moderately Isolated (12/11/2022)   Social Connection and Isolation Panel [NHANES]    Frequency of Communication with Friends and Family: More than three times a week    Frequency of Social Gatherings with Friends and Family: More than three times a week    Attends Religious Services: More than 4 times per year    Active Member of [employer] or Organizations: No    Attends Banker Meetings: Never    Marital Status: Never married  Intimate Partner Violence: Not At Risk (12/11/2022)   Humiliation, Afraid, Rape, and Kick questionnaire    Fear of Current or Ex-Partner: No    Emotionally Abused: No    Physically Abused: No    Sexually Abused: No     OBSERVATIONS/OBJECTIVE:  BP (!) 163/81 (BP Location: Left Arm, Patient Position: Sitting)   Pulse 91   Temp 98.1 F (36.7 C) (Temporal)   Resp 16   Wt 229 lb 8 oz (104.1 kg)   SpO2 98%   BMI 39.39 kg/m   Physical Exam Constitutional:      Appearance: Normal appearance.  Chest:       Comments: Bilateral breasts inspected. Left breast mass with inconspicuous margins. No regional adenopathy Musculoskeletal:     Cervical back: Normal range of motion and neck supple. No rigidity.  Lymphadenopathy:     Cervical: No cervical adenopathy.  Neurological:      Mental Status: She is alert.    LABORATORY DATA:  None for this visit.  DIAGNOSTIC IMAGING:  None for this visit.      ASSESSMENT AND PLAN:    Ms.. Aguilera is a pleasant  72 y.o. female with Stage IA left breast invasive ductal carcinoma, ER+/PR+/HER2-, diagnosed in 02/2021, treated with lumpectomy, adjuvant radiation therapy, and anti-estrogen therapy with Anastrozole beginning in 05/2021.   She is tolerating anastrozole well except for intermittent hot flashes.  She will complete 5 years of anastrozole in January 2028. Since her last visit, she had a mammogram and ultrasound which showed a left breast mass measuring 1.1 cm highly suspicious and surgical excision was recommended.  She has already followed up with Dr. Luisa Hart, surgery scheduled for next week.  We have reassured her to wait for the pathology results.  If she does indeed have recurrence while on anastrozole, we will switch antiestrogen therapy and discuss additional recommendations.  She is understandably very worried and anxious.  I will call her about in 2 weeks to review pathology results and discuss recommendations.  Total encounter time: in face to face visit time, chart review, lab review, care coordination, and documentation of the encounter.    *Total Encounter Time as defined by the Centers for Medicare and Medicaid Services includes, in addition to the face-to-face time of a patient visit (documented in the note above) non-face-to-face time: obtaining and reviewing outside history, ordering and reviewing medications, tests or procedures, care coordination (communications with other health care professionals or caregivers) and documentation in the medical record.

## 2023-04-29 NOTE — Progress Notes (Signed)

## 2023-05-05 ENCOUNTER — Other Ambulatory Visit: Payer: Self-pay

## 2023-05-05 ENCOUNTER — Encounter (HOSPITAL_BASED_OUTPATIENT_CLINIC_OR_DEPARTMENT_OTHER)
Admission: RE | Disposition: A | Discharge status: Home / Self Care | Payer: Self-pay | Source: Home / Self Care | Attending: Surgery

## 2023-05-05 ENCOUNTER — Ambulatory Visit (HOSPITAL_BASED_OUTPATIENT_CLINIC_OR_DEPARTMENT_OTHER): Payer: Medicare Other | Admitting: Anesthesiology

## 2023-05-05 ENCOUNTER — Encounter (HOSPITAL_BASED_OUTPATIENT_CLINIC_OR_DEPARTMENT_OTHER): Payer: Self-pay | Admitting: Surgery

## 2023-05-05 ENCOUNTER — Ambulatory Visit (HOSPITAL_BASED_OUTPATIENT_CLINIC_OR_DEPARTMENT_OTHER)
Admission: RE | Admit: 2023-05-05 | Discharge: 2023-05-05 | Disposition: A | Discharge status: Home / Self Care | Payer: Medicare Other | Attending: Surgery | Admitting: Surgery

## 2023-05-05 DIAGNOSIS — N6321 Unspecified lump in the left breast, upper outer quadrant: Secondary | ICD-10-CM

## 2023-05-05 DIAGNOSIS — N6322 Unspecified lump in the left breast, upper inner quadrant: Secondary | ICD-10-CM | POA: Diagnosis not present

## 2023-05-05 DIAGNOSIS — N6032 Fibrosclerosis of left breast: Secondary | ICD-10-CM | POA: Diagnosis not present

## 2023-05-05 DIAGNOSIS — Z923 Personal history of irradiation: Secondary | ICD-10-CM | POA: Diagnosis not present

## 2023-05-05 DIAGNOSIS — R928 Other abnormal and inconclusive findings on diagnostic imaging of breast: Secondary | ICD-10-CM | POA: Diagnosis not present

## 2023-05-05 DIAGNOSIS — N6320 Unspecified lump in the left breast, unspecified quadrant: Secondary | ICD-10-CM | POA: Diagnosis not present

## 2023-05-05 DIAGNOSIS — I10 Essential (primary) hypertension: Secondary | ICD-10-CM | POA: Insufficient documentation

## 2023-05-05 DIAGNOSIS — Z8249 Family history of ischemic heart disease and other diseases of the circulatory system: Secondary | ICD-10-CM | POA: Insufficient documentation

## 2023-05-05 DIAGNOSIS — Z853 Personal history of malignant neoplasm of breast: Secondary | ICD-10-CM | POA: Diagnosis not present

## 2023-05-05 DIAGNOSIS — E039 Hypothyroidism, unspecified: Secondary | ICD-10-CM | POA: Insufficient documentation

## 2023-05-05 DIAGNOSIS — M1990 Unspecified osteoarthritis, unspecified site: Secondary | ICD-10-CM | POA: Diagnosis not present

## 2023-05-05 DIAGNOSIS — N6489 Other specified disorders of breast: Secondary | ICD-10-CM | POA: Diagnosis not present

## 2023-05-05 DIAGNOSIS — C50412 Malignant neoplasm of upper-outer quadrant of left female breast: Secondary | ICD-10-CM | POA: Diagnosis not present

## 2023-05-05 HISTORY — PX: BREAST LUMPECTOMY WITH RADIOACTIVE SEED LOCALIZATION: SHX6424

## 2023-05-05 SURGERY — BREAST LUMPECTOMY WITH RADIOACTIVE SEED LOCALIZATION
Anesthesia: General | Site: Breast | Laterality: Left

## 2023-05-05 MED ORDER — ONDANSETRON HCL 4 MG/2ML IJ SOLN
INTRAMUSCULAR | Status: DC | PRN
  Administered 2023-05-05: 4 mg via INTRAVENOUS

## 2023-05-05 MED ORDER — DEXAMETHASONE SODIUM PHOSPHATE 10 MG/ML IJ SOLN
INTRAMUSCULAR | Status: DC | PRN
  Administered 2023-05-05: 5 mg via INTRAVENOUS

## 2023-05-05 MED ORDER — ONDANSETRON HCL 4 MG/2ML IJ SOLN
INTRAMUSCULAR | Status: AC
  Filled 2023-05-05: qty 2

## 2023-05-05 MED ORDER — CEFAZOLIN SODIUM-DEXTROSE 2-3 GM-%(50ML) IV SOLR
INTRAVENOUS | Status: DC | PRN
  Administered 2023-05-05: 2 g via INTRAVENOUS

## 2023-05-05 MED ORDER — PROPOFOL 500 MG/50ML IV EMUL
INTRAVENOUS | Status: DC | PRN
  Administered 2023-05-05: 35 ug/kg/min via INTRAVENOUS

## 2023-05-05 MED ORDER — FENTANYL CITRATE (PF) 100 MCG/2ML IJ SOLN
INTRAMUSCULAR | Status: AC
  Filled 2023-05-05: qty 2

## 2023-05-05 MED ORDER — GABAPENTIN 300 MG PO CAPS
300.0000 mg | ORAL_CAPSULE | ORAL | Status: AC
  Administered 2023-05-05: 300 mg via ORAL

## 2023-05-05 MED ORDER — PHENYLEPHRINE HCL (PRESSORS) 10 MG/ML IV SOLN
INTRAVENOUS | Status: DC | PRN
  Administered 2023-05-05 (×3): 160 ug via INTRAVENOUS
  Administered 2023-05-05: 80 ug via INTRAVENOUS
  Administered 2023-05-05 (×2): 160 ug via INTRAVENOUS

## 2023-05-05 MED ORDER — DEXAMETHASONE SODIUM PHOSPHATE 10 MG/ML IJ SOLN
INTRAMUSCULAR | Status: AC
  Filled 2023-05-05: qty 1

## 2023-05-05 MED ORDER — LIDOCAINE 2% (20 MG/ML) 5 ML SYRINGE
INTRAMUSCULAR | Status: AC
  Filled 2023-05-05: qty 5

## 2023-05-05 MED ORDER — BUPIVACAINE-EPINEPHRINE (PF) 0.25% -1:200000 IJ SOLN
INTRAMUSCULAR | Status: DC | PRN
  Administered 2023-05-05: 20 mL

## 2023-05-05 MED ORDER — ACETAMINOPHEN 500 MG PO TABS
1000.0000 mg | ORAL_TABLET | ORAL | Status: AC
  Administered 2023-05-05: 1000 mg via ORAL

## 2023-05-05 MED ORDER — LIDOCAINE HCL (CARDIAC) PF 100 MG/5ML IV SOSY
PREFILLED_SYRINGE | INTRAVENOUS | Status: DC | PRN
  Administered 2023-05-05: 80 mg via INTRAVENOUS

## 2023-05-05 MED ORDER — CEFAZOLIN SODIUM-DEXTROSE 2-4 GM/100ML-% IV SOLN: 2.0000 g | INTRAVENOUS | Status: DC

## 2023-05-05 MED ORDER — ACETAMINOPHEN 500 MG PO TABS
ORAL_TABLET | ORAL | Status: AC
  Filled 2023-05-05: qty 2

## 2023-05-05 MED ORDER — OXYCODONE HCL 5 MG PO TABS: 5.0000 mg | ORAL_TABLET | Freq: Four times a day (QID) | ORAL | 0 refills | Status: DC | PRN

## 2023-05-05 MED ORDER — LACTATED RINGERS IV SOLN: INTRAVENOUS | Status: DC | PRN

## 2023-05-05 MED ORDER — CEFAZOLIN SODIUM-DEXTROSE 2-4 GM/100ML-% IV SOLN
INTRAVENOUS | Status: AC
  Filled 2023-05-05: qty 100

## 2023-05-05 MED ORDER — FENTANYL CITRATE (PF) 100 MCG/2ML IJ SOLN
INTRAMUSCULAR | Status: DC | PRN
  Administered 2023-05-05: 100 ug via INTRAVENOUS

## 2023-05-05 MED ORDER — PROPOFOL 10 MG/ML IV BOLUS
INTRAVENOUS | Status: DC | PRN
  Administered 2023-05-05: 200 mg via INTRAVENOUS

## 2023-05-05 MED ORDER — GABAPENTIN 300 MG PO CAPS
ORAL_CAPSULE | ORAL | Status: AC
  Filled 2023-05-05: qty 1

## 2023-05-05 MED ORDER — PROPOFOL 10 MG/ML IV BOLUS
INTRAVENOUS | Status: AC
  Filled 2023-05-05: qty 20

## 2023-05-05 MED ORDER — LACTATED RINGERS IV SOLN: INTRAVENOUS | Status: DC

## 2023-05-05 SURGICAL SUPPLY — 50 items
ADH SKN CLS APL DERMABOND .7 (GAUZE/BANDAGES/DRESSINGS) ×1
APL PRP STRL LF DISP 70% ISPRP (MISCELLANEOUS) ×1
APPLIER CLIP 9.375 MED OPEN (MISCELLANEOUS)
APR CLP MED 9.3 20 MLT OPN (MISCELLANEOUS)
BAG DECANTER FOR FLEXI CONT (MISCELLANEOUS) IMPLANT
BINDER BREAST LRG (GAUZE/BANDAGES/DRESSINGS) IMPLANT
BINDER BREAST MEDIUM (GAUZE/BANDAGES/DRESSINGS) IMPLANT
BINDER BREAST XLRG (GAUZE/BANDAGES/DRESSINGS) IMPLANT
BINDER BREAST XXLRG (GAUZE/BANDAGES/DRESSINGS) IMPLANT
BLADE SURG 15 STRL LF DISP TIS (BLADE) ×1 IMPLANT
BLADE SURG 15 STRL SS (BLADE) ×1
CANISTER SUC SOCK COL 7IN (MISCELLANEOUS) IMPLANT
CANISTER SUCT 1200ML W/VALVE (MISCELLANEOUS) IMPLANT
CHLORAPREP W/TINT 26 (MISCELLANEOUS) ×1 IMPLANT
CLIP APPLIE 9.375 MED OPEN (MISCELLANEOUS) IMPLANT
COVER BACK TABLE 60X90IN (DRAPES) ×1 IMPLANT
COVER MAYO STAND STRL (DRAPES) ×1 IMPLANT
COVER PROBE CYLINDRICAL 5X96 (MISCELLANEOUS) ×1 IMPLANT
DERMABOND ADVANCED .7 DNX12 (GAUZE/BANDAGES/DRESSINGS) ×1 IMPLANT
DRAPE LAPAROSCOPIC ABDOMINAL (DRAPES) IMPLANT
DRAPE LAPAROTOMY 100X72 PEDS (DRAPES) ×1 IMPLANT
DRAPE UTILITY XL STRL (DRAPES) ×1 IMPLANT
ELECT COATED BLADE 2.86 ST (ELECTRODE) ×1 IMPLANT
ELECT REM PT RETURN 9FT ADLT (ELECTROSURGICAL) ×1
ELECTRODE REM PT RTRN 9FT ADLT (ELECTROSURGICAL) ×1 IMPLANT
GLOVE BIOGEL PI IND STRL 8 (GLOVE) ×1 IMPLANT
GLOVE ECLIPSE 8.0 STRL XLNG CF (GLOVE) ×1 IMPLANT
GOWN STRL REUS W/ TWL LRG LVL3 (GOWN DISPOSABLE) ×2 IMPLANT
GOWN STRL REUS W/ TWL XL LVL3 (GOWN DISPOSABLE) ×1 IMPLANT
GOWN STRL REUS W/TWL LRG LVL3 (GOWN DISPOSABLE) ×2
GOWN STRL REUS W/TWL XL LVL3 (GOWN DISPOSABLE) ×1
HEMOSTAT ARISTA ABSORB 3G PWDR (HEMOSTASIS) IMPLANT
HEMOSTAT SNOW SURGICEL 2X4 (HEMOSTASIS) IMPLANT
KIT MARKER MARGIN INK (KITS) ×1 IMPLANT
NDL HYPO 25X1 1.5 SAFETY (NEEDLE) ×1 IMPLANT
NEEDLE HYPO 25X1 1.5 SAFETY (NEEDLE) ×1
NS IRRIG 1000ML POUR BTL (IV SOLUTION) ×1 IMPLANT
PACK BASIN DAY SURGERY FS (CUSTOM PROCEDURE TRAY) ×1 IMPLANT
PENCIL SMOKE EVACUATOR (MISCELLANEOUS) ×1 IMPLANT
SLEEVE SCD COMPRESS KNEE MED (STOCKING) ×1 IMPLANT
SPIKE FLUID TRANSFER (MISCELLANEOUS) IMPLANT
SPONGE T-LAP 4X18 ~~LOC~~+RFID (SPONGE) ×1 IMPLANT
SUT MNCRL AB 4-0 PS2 18 (SUTURE) ×1 IMPLANT
SUT SILK 2 0 SH (SUTURE) IMPLANT
SUT VICRYL 3-0 CR8 SH (SUTURE) ×1 IMPLANT
SYR CONTROL 10ML LL (SYRINGE) ×1 IMPLANT
TOWEL GREEN STERILE FF (TOWEL DISPOSABLE) ×1 IMPLANT
TRAY FAXITRON CT DISP (TRAY / TRAY PROCEDURE) ×1 IMPLANT
TUBE CONNECTING 20X1/4 (TUBING) IMPLANT
YANKAUER SUCT BULB TIP NO VENT (SUCTIONS) IMPLANT

## 2023-05-05 NOTE — Interval H&P Note (Signed)
History and Physical Interval Note:  05/05/2023 10:59 AM  Laurie Mckenzie  has presented today for surgery, with the diagnosis of LEFT BREAST MASS.  The various methods of treatment have been discussed with the patient and family. After consideration of risks, benefits and other options for treatment, the patient has consented to  Procedure(s): LEFT BREAST SEED LUMPECTOMY (Left) as a surgical intervention.  The patient's history has been reviewed, patient examined, no change in status, stable for surgery.  I have reviewed the patient's chart and labs.  Questions were answered to the patient's satisfaction.    The procedure has been discussed with the patient. Alternatives to surgery have been discussed with the patient.  Risks of surgery include bleeding,  Infection,  Seroma formation, death,  and the need for further surgery.   The patient understands and wishes to proceed.  Tayvon Culley A Brenda Cowher

## 2023-05-05 NOTE — Anesthesia Preprocedure Evaluation (Addendum)
Anesthesia Evaluation  Patient identified by MRN, date of birth, ID band Patient awake    Reviewed: Allergy & Precautions, NPO status , Patient's Chart, lab work & pertinent test results  Airway Mallampati: IV  TM Distance: >3 FB Neck ROM: Full    Dental  (+) Teeth Intact, Dental Advisory Given   Pulmonary neg pulmonary ROS   breath sounds clear to auscultation       Cardiovascular hypertension,  Rhythm:Regular Rate:Normal     Neuro/Psych  Headaches  negative psych ROS   GI/Hepatic negative GI ROS, Neg liver ROS,,,  Endo/Other  Hypothyroidism    Renal/GU negative Renal ROS     Musculoskeletal  (+) Arthritis ,    Abdominal   Peds  Hematology negative hematology ROS (+)   Anesthesia Other Findings   Reproductive/Obstetrics                             Anesthesia Physical Anesthesia Plan  ASA: 2  Anesthesia Plan: General   Post-op Pain Management: Tylenol PO (pre-op)* and Gabapentin PO (pre-op)*   Induction: Intravenous  PONV Risk Score and Plan: 4 or greater and Ondansetron, Treatment may vary due to age or medical condition and Dexamethasone  Airway Management Planned: LMA  Additional Equipment: None  Intra-op Plan:   Post-operative Plan: Extubation in OR  Informed Consent: I have reviewed the patients History and Physical, chart, labs and discussed the procedure including the risks, benefits and alternatives for the proposed anesthesia with the patient or authorized representative who has indicated his/her understanding and acceptance.     Dental advisory given  Plan Discussed with: CRNA  Anesthesia Plan Comments:        Anesthesia Quick Evaluation

## 2023-05-05 NOTE — Anesthesia Postprocedure Evaluation (Signed)
Anesthesia Post Note  Patient: Laurie Mckenzie  Procedure(s) Performed: RADIOACTIVE SEED GUIDED LEFT BREAST LUMPECTOMY (Left: Breast)     Patient location during evaluation: PACU Anesthesia Type: General Level of consciousness: awake and alert Pain management: pain level controlled Vital Signs Assessment: post-procedure vital signs reviewed and stable Respiratory status: spontaneous breathing, nonlabored ventilation, respiratory function stable and patient connected to nasal cannula oxygen Cardiovascular status: blood pressure returned to baseline and stable Postop Assessment: no apparent nausea or vomiting Anesthetic complications: no  No notable events documented.  Last Vitals:  Vitals:   05/05/23 1245 05/05/23 1300  BP: 117/70   Pulse: 70 68  Resp: 17 18  Temp:    SpO2: 96% 92%    Last Pain:  Vitals:   05/05/23 1245  TempSrc:   PainSc: 0-No pain                 Shelton Silvas

## 2023-05-05 NOTE — Discharge Instructions (Addendum)
[facility] Office Phone Number [phone]  BREAST BIOPSY/ PARTIAL MASTECTOMY: POST OP INSTRUCTIONS  Always review your discharge instruction sheet given to you by the facility where your surgery was performed.  IF YOU HAVE DISABILITY OR FAMILY LEAVE FORMS, YOU MUST BRING THEM TO THE OFFICE FOR PROCESSING.  DO NOT GIVE THEM TO YOUR DOCTOR.  A prescription for pain medication may be given to you upon discharge.  Take your pain medication as prescribed, if needed.  If narcotic pain medicine is not needed, then you may take acetaminophen (Tylenol) or ibuprofen (Advil) as needed. Take your usually prescribed medications unless otherwise directed If you need a refill on your pain medication, please contact your pharmacy.  They will contact our office to request authorization.  Prescriptions will not be filled after 5pm or on week-ends. You should eat very light the first 24 hours after surgery, such as soup, crackers, pudding, etc.  Resume your normal diet the day after surgery. Most patients will experience some swelling and bruising in the breast.  Ice packs and a good support bra will help.  Swelling and bruising can take several days to resolve.  It is common to experience some constipation if taking pain medication after surgery.  Increasing fluid intake and taking a stool softener will usually help or prevent this problem from occurring.  A mild laxative (Milk of Magnesia or Miralax) should be taken according to package directions if there are no bowel movements after 48 hours. Unless discharge instructions indicate otherwise, you may remove your bandages 24-48 hours after surgery, and you may shower at that time.  You may have steri-strips (small skin tapes) in place directly over the incision.  These strips should be left on the skin for 7-10 days.  If your surgeon used skin glue on the incision, you may shower in 24 hours.  The glue will flake off over the next 2-3 weeks.  Any  sutures or staples will be removed at the office during your follow-up visit. ACTIVITIES:  You may resume regular daily activities (gradually increasing) beginning the next day.  Wearing a good support bra or sports bra minimizes pain and swelling.  You may have sexual intercourse when it is comfortable. You may drive when you no longer are taking prescription pain medication, you can comfortably wear a seatbelt, and you can safely maneuver your car and apply brakes. RETURN TO WORK:  ______________________________________________________________________________________ Laurie Mckenzie should see your doctor in the office for a follow-up appointment approximately two weeks after your surgery.  Your doctor's nurse will typically make your follow-up appointment when she calls you with your pathology report.  Expect your pathology report 2-3 business days after your surgery.  You may call to check if you do not hear from [place] after three days. OTHER INSTRUCTIONS: _______________________________________________________________________________________________ _____________________________________________________________________________________________________________________________________ _____________________________________________________________________________________________________________________________________ _____________________________________________________________________________________________________________________________________  WHEN TO CALL YOUR DOCTOR: Fever over 101.0 Nausea and/or vomiting. Extreme swelling or bruising. Continued bleeding from incision. Increased pain, redness, or drainage from the incision.  The clinic staff is available to answer your questions during regular business hours.  Please don't hesitate to call and ask to speak to one of the nurses for clinical concerns.  If you have a medical emergency, go to the nearest emergency room or call 911.  A surgeon from [facility] is always on call at the hospital.  For further questions, please visit [email or web address]   Post Anesthesia Home Care Instructions  Activity: Get plenty of rest for the remainder of the  day. A responsible individual must stay with you for 24 hours following the procedure.  For the next 24 hours, DO NOT: -Drive a car -Advertising copywriter -Drink alcoholic beverages -Take any medication unless instructed by your physician -Make any legal decisions or sign important papers.  Meals: Start with liquid foods such as gelatin or soup. Progress to regular foods as tolerated. Avoid greasy, spicy, heavy foods. If nausea and/or vomiting occur, drink only clear liquids until the nausea and/or vomiting subsides. Call your physician if vomiting continues.  Special Instructions/Symptoms: Your throat may feel dry or sore from the anesthesia or the breathing tube placed in your throat during surgery. If this causes discomfort, gargle with warm salt water. The discomfort should disappear within 24 hours.  If you had a scopolamine patch placed behind your ear for the management of post- operative nausea and/or vomiting:  1. The medication in the patch is effective for 72 hours, after which it should be removed.  Wrap patch in a tissue and discard in the trash. Wash hands thoroughly with soap and water. 2. You may remove the patch earlier than 72 hours if you experience unpleasant side effects which may include dry mouth, dizziness or visual disturbances. 3. Avoid touching the patch. Wash your hands with soap and water after contact with the patch.       Next dose of Tylenol may be taken at 3:30p

## 2023-05-05 NOTE — Anesthesia Procedure Notes (Signed)
Procedure Name: LMA Insertion Date/Time: 05/05/2023 11:41 AM  Performed by: Karen Kitchens, CRNAPre-anesthesia Checklist: Patient identified, Emergency Drugs available, Suction available and Patient being monitored Patient Re-evaluated:Patient Re-evaluated prior to induction Oxygen Delivery Method: Circle system utilized Preoxygenation: Pre-oxygenation with 100% oxygen Induction Type: IV induction Ventilation: Mask ventilation without difficulty LMA: LMA inserted LMA Size: 4.0 Number of attempts: 1 Airway Equipment and Method: Bite block Placement Confirmation: positive ETCO2, breath sounds checked- equal and bilateral and CO2 detector Tube secured with: Tape Dental Injury: Teeth and Oropharynx as per pre-operative assessment

## 2023-05-05 NOTE — Op Note (Signed)
Preoperative diagnosis: Left breast mass upper outer quadrant  Postoperative diagnosis: Same  Procedure: Left breast seed localized lumpectomy  Surgeon: Harriette Bouillon, MD  Anesthesia: LMA with 0.25% Marcaine with epinephrine  EBL: Minimal  Specimen: Left breast tissue with seed and clip verified by Faxitron  Drains: None  IV fluids: Per OR record  Indications for procedure: The patient is a 72 year old female with a history of left breast cancer.  She has a left breast mass that was biopsied in the past but has been growing.  Excision was recommended for further evaluation.  Previous core biopsy showed benign changes.  Risks and benefits of removal were discussed with the patient.  She agreed to proceed.The procedure has been discussed with the patient. Alternatives to surgery have been discussed with the patient.  Risks of surgery include bleeding,  Infection,  Seroma formation, death,  and the need for further surgery.   The patient understands and wishes to proceed.   Description of procedure: The patient was met in the holding area and questions were answered.  Left breast was marked as correct site and a seed was placed in the left breast as an outpatient by radiology.  She was then taken back to the operating room.  She was placed supine upon the operating room table.  After induction of general anesthesia, left breast was prepped and draped in a sterile fashion and timeout was performed.  Proper patient, site and procedure verified.  Neoprobe used to identify the seed in the left breast upper outer quadrant.  A curvilinear incision was made over the signal after infiltration of the skin with local anesthetic.  Dissection was carried down and all tissue around the seed and clip were excised with a grossly negative margin.  This was oriented with ink.  Intraoperative imaging showed the seed and clip to be in the specimen and this was sent to pathology.  The wound was made hemostatic with  cautery.  Deep tissue planes were infiltrated with local anesthetic.  All deep tissue space was closed with 3-0 Vicryl.  4 Monocryl was used to close the skin in a subcuticular fashion.  Dermabond applied.  Breast binder placed.  All counts were found to be correct.  The patient was then awoke extubated taken to recovery in satisfactory condition.

## 2023-05-05 NOTE — Transfer of Care (Signed)
Immediate Anesthesia Transfer of Care Note  Patient: Laurie Mckenzie  Procedure(s) Performed: RADIOACTIVE SEED GUIDED LEFT BREAST LUMPECTOMY (Left: Breast)  Patient Location: PACU  Anesthesia Type:General  Level of Consciousness: awake, alert , and oriented  Airway & Oxygen Therapy: Patient Spontanous Breathing and Patient connected to face mask oxygen  Post-op Assessment: Report given to RN and Post -op Vital signs reviewed and stable  Post vital signs: Reviewed and stable  Last Vitals:  Vitals Value Taken Time  BP 109/69 05/05/23 1224  Temp    Pulse 77 05/05/23 1225  Resp 21 05/05/23 1225  SpO2 96 % 05/05/23 1225  Vitals shown include unfiled device data.  Last Pain:  Vitals:   05/05/23 0915  TempSrc: Temporal  PainSc: 0-No pain         Complications: No notable events documented.

## 2023-05-05 NOTE — H&P (Signed)
History of Present Illness: Laurie Mckenzie is a 72 y.o. female who is seen today for evaluation of a left breast mass on recent mammogram. Last year she had a core biopsy of a left breast mass which was benign showing hemosiderin laden macrophages and fibrosis. History of left breast lumpectomy 2022 for left breast cancer. This was followed by radiation therapy. On recent mammogram this area increased in size modestly. She was sent for consideration of left breast lumpectomy due to change in size and previous left breast surgery secondary to left breast cancer. She has no complaints today..    Review of Systems: A complete review of systems was obtained from the patient. I have reviewed this information and discussed as appropriate with the patient. See HPI as well for other ROS.    Medical History: Past Medical History:  Diagnosis Date  Headache  Hyperlipidemia  Hypertension  Hypothyroidism  Date Unknown  Primary osteoarthritis of knee   Patient Active Problem List  Diagnosis  Malignant neoplasm of upper-outer quadrant of left breast in female, estrogen receptor positive (CMS/HHS-HCC)  Hyperlipidemia, unspecified   Past Surgical History:  Procedure Laterality Date  ARTHROPLASTY TOTAL KNEE Right  08/23/2017  COLONOSCOPY N/A  01/16/2015  Knee Arthroplasty, left Left  2011    Allergies  Allergen Reactions  Fish Containing Products Hives  Iodinated Contrast Media Other (See Comments)  MD advised not to use, due to fish allergies  Sulfa (Sulfonamide Antibiotics) Other (See Comments)  Venom-Honey Bee Other (See Comments) and Swelling  Red and hot to site   Current Outpatient Medications on File Prior to Visit  Medication Sig Dispense Refill  acetaminophen (TYLENOL) 500 MG tablet Take by mouth  amLODIPine (NORVASC) 2.5 MG tablet Take by mouth  atorvastatin (LIPITOR) 20 MG tablet Take 20 mg by mouth once daily  EPINEPHrine (EPIPEN) 0.3 mg/0.3 mL auto-injector Inject 0.3 mLs  (0.3 mgttotal) into the muscleoonce for 1 dose.  levothyroxine (SYNTHROID) 150 MCG tablet Take by mouth  montelukast (SINGULAIR) 10 mg tablet Take by mouth  multivitamin tablet Take 1 tablet by mouth once daily   No current facility-administered medications on file prior to visit.   Family History  Problem Relation Age of Onset  High blood pressure (Hypertension) Mother  Diabetes Maternal Aunt    Social History   Tobacco Use  Smoking Status Never  Smokeless Tobacco Never    Social History   Socioeconomic History  Marital status: Unknown  Tobacco Use  Smoking status: Never  Smokeless tobacco: Never  Vaping Use  Vaping status: Never Used  Substance and Sexual Activity  Alcohol use: Never  Drug use: Never  Sexual activity: Defer   Social Drivers of Health   Financial Resource Strain: Low Risk (12/11/2022)  Received from [facility]  Overall Financial Resource Strain (CARDIA)  Difficulty of Paying Living Expenses: Not hard at all  Food Insecurity: No Food Insecurity (12/11/2022)  [facility] from [facility]  Hunger Vital Sign  Worried About Running Out of Food in the Last Year: Never true  Ran Out of Food in the Last Year: Never true  Transportation Needs: No Transportation Needs (12/11/2022)  Received from [facility] - Transportation  Lack of Transportation (Medical): No  Lack of Transportation (Non-Medical): No  Physical Activity: Insufficiently Active (12/11/2022)  Received from [facility]  Exercise Vital Sign  Days of Exercise per Week: 3 days  Minutes of Exercise per Session: 30 min  Stress: No Stress Concern Present (12/11/2022)  Received from  [place]  [employer] of Occupational Health - Occupational Stress Questionnaire  Feeling of Stress : Not at all  Social Connections: Moderately Isolated (12/11/2022)  Received from [employer]  Social Connection and Isolation Panel [NHANES]  Frequency of Communication with Friends and Family: More  than three times a week  Frequency of Social Gatherings with Friends and Family: More than three times a week  Attends Religious Services: More than 4 times per year  Active Member of Clubs or Organizations: No  Attends Banker Meetings: Never  Marital Status: Never married   Objective:   Vitals:  04/23/23 0912 04/23/23 0913  BP: (!) 148/90  Weight: (!) 104.1 kg (229 lb 9.6 oz)  Height: 163.8 cm (5' 4.5")  PainSc: 0-No pain  PainLoc: Breast   Body mass index is 38.8 kg/m.  Physical Exam Exam conducted with a chaperone present.  Cardiovascular:  Rate and Rhythm: Normal rate.  Pulmonary:  Effort: Pulmonary effort is normal.  Chest:  Breasts: Right: Normal.   Comments: Left breast post op changes  Musculoskeletal:  General: Normal range of motion.  Cervical back: Normal range of motion.  Lymphadenopathy:  Upper Body:  Right upper body: No axillary adenopathy.  Left upper body: No axillary adenopathy.  Skin: General: Skin is warm.  Neurological:  General: No focal deficit present.  Mental Status: She is alert.  Psychiatric:  Mood and Affect: Mood normal.     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 It is a modest increase in size of this area from previous ultrasound but recommend seed localized lumpectomy left breast given her past history. We discussed the pros and cons of intervention and the rationale for doing so given her past history.The procedure has been discussed with the patient. Alternatives to surgery have been discussed with the patient. Risks of surgery include bleeding,  Infection, Seroma formation, death, and the need for further surgery. The patient understands and wishes to proceed.   No follow-ups on file.  Hayden Rasmussen, MD

## 2023-05-06 ENCOUNTER — Encounter: Payer: Self-pay | Admitting: Surgery

## 2023-05-06 ENCOUNTER — Encounter (HOSPITAL_BASED_OUTPATIENT_CLINIC_OR_DEPARTMENT_OTHER): Payer: Self-pay | Admitting: Surgery

## 2023-05-06 LAB — SURGICAL PATHOLOGY

## 2023-05-12 ENCOUNTER — Inpatient Hospital Stay: Payer: Medicare Other | Attending: Hematology and Oncology | Admitting: Hematology and Oncology

## 2023-05-12 DIAGNOSIS — C50412 Malignant neoplasm of upper-outer quadrant of left female breast: Secondary | ICD-10-CM | POA: Diagnosis not present

## 2023-05-12 DIAGNOSIS — Z170 Estrogen receptor positive status [ER+]: Secondary | ICD-10-CM

## 2023-05-12 NOTE — Progress Notes (Signed)
ONCOLOGIC HISTORY:  Oncology History  Malignant neoplasm of upper-outer quadrant of left breast in female, estrogen receptor positive (HCC)  03/12/2021 Initial Diagnosis   status post left breast upper outer quadrant biopsy 03/12/2021 for a clinical T1c N0, stage IA invasive ductal carcinoma, grade 2, estrogen and progesterone receptor positive, HER2 not amplified (FISH results pending) with an MIB-1 of 30%   03/19/2021 Cancer Staging   Staging form: Breast, AJCC 8th Edition - Clinical stage from 03/19/2021: Stage IA (cT1c, cN0, cM0, G2, ER+, PR+, HER2-) - Signed by Lowella Dell, MD on 03/19/2021 Stage prefix: Initial diagnosis Histologic grading system: 3 grade system Laterality: Left Staged by: Pathologist and managing physician Stage used in treatment planning: Yes National guidelines used in treatment planning: Yes Type of national guideline used in treatment planning: NCCN   03/27/2021 Cancer Staging   Staging form: Breast, AJCC 8th Edition - Pathologic stage from 03/27/2021: Stage IA (pT1b, pN0, cM0, G3, ER+, PR+, HER2-) - Signed by Loa Socks, NP on 07/19/2021 Histologic grading system: 3 grade system   03/27/2021 Surgery   status post left lumpectomy and sentinel lymph node sampling 03/27/2021 for a pT1b pN0, stage IA invasive ductal carcinoma, grade 3, with negative margins.             (a) 2 left axillary lymph nodes were removed     03/27/2021 Oncotype testing   Oncotype score of 24 predicts a risk of recurrence outside the breast over the next 9 years of 10% if the patient's only systemic therapy is antiestrogens for 5 years.  It also predicts minimal benefit from adjuvant chemotherapy   05/01/2021 - 06/03/2021 Radiation Therapy   05/01/2021 through 06/03/2021 Site Technique Total Dose (Gy) Dose per Fx (Gy) Completed Fx Beam Energies  Breast, Left: Breast_Lt 3D 42.56/42.56 2.66 16/16 10X  Breast, Left: Breast_Lt_Bst 3D 8/8 2 4/4 6X, 10X     05/29/2021 -   Anti-estrogen oral therapy   Anastrozole daily     INTERVAL HISTORY:   She is taking anastrozole daily with good tolerance.   Since her last visit, she had lumpectomy and she is here to review the pathology results.  She was extremely worried about the suspicious mass in the left breast.   REVIEW OF SYSTEMS:  Review of Systems  Constitutional:  Negative for appetite change, chills, fatigue, fever and unexpected weight change.  HENT:   Negative for hearing loss, lump/mass and trouble swallowing.   Eyes:  Negative for eye problems and icterus.  Respiratory:  Negative for chest tightness, cough and shortness of breath.   Cardiovascular:  Negative for chest pain, leg swelling and palpitations.  Gastrointestinal:  Negative for abdominal distention, abdominal pain, constipation, diarrhea, nausea and vomiting.  Endocrine: Positive for hot flashes.  Genitourinary:  Negative for difficulty urinating.   Musculoskeletal:  Negative for arthralgias.  Skin:  Negative for itching and rash.  Neurological:  Negative for dizziness, extremity weakness, headaches and numbness.  Hematological:  Negative for adenopathy. Does not bruise/bleed easily.  Psychiatric/Behavioral:  Negative for depression. The patient is not nervous/anxious.    Breast: Denies any new nodularity, masses, tenderness, nipple changes, or nipple discharge.   ONCOLOGY TREATMENT TEAM:  1. Surgeon:  Dr. Luisa Hart at [facility] 2. Medical Oncologist: Dr. Al Pimple  3. Radiation Oncologist: Dr. Mitzi Hansen    PAST MEDICAL/SURGICAL HISTORY:  Past Medical History:  Diagnosis Date   Cancer [facility])    Headache    Hypertension    186/106 today  195/108 07/27/2017   Hypothyroidism    Primary osteoarthritis of knee    Right   Past Surgical History:  Procedure Laterality Date   BREAST LUMPECTOMY WITH RADIOACTIVE SEED AND SENTINEL LYMPH NODE BIOPSY Left 03/27/2021   Procedure: LEFT BREAST LUMPECTOMY WITH RADIOACTIVE SEED AND  SENTINEL LYMPH NODE BIOPSY;  Surgeon: Harriette Bouillon, MD;  Location: [facility];  Service: General;  Laterality: Left;   BREAST LUMPECTOMY WITH RADIOACTIVE SEED LOCALIZATION Left 05/05/2023   Procedure: RADIOACTIVE SEED GUIDED LEFT BREAST LUMPECTOMY;  Surgeon: Harriette Bouillon, MD;  Location: [facility];  Service: General;  Laterality: Left;   COLONOSCOPY N/A 01/16/2015   Procedure: COLONOSCOPY;  Surgeon: Corbin Ade, MD;  [facility];  Service: Endoscopy;  Laterality: N/A;  8:30 AM   KNEE ARTHROPLASTY Left 2011   [place]   TOTAL KNEE ARTHROPLASTY Right 08/23/2017   Procedure: TOTAL KNEE ARTHROPLASTY;  Surgeon: Salvatore Marvel, MD;  [facility];  Service: Orthopedics;  Laterality: Right;     ALLERGIES:  Allergies  Allergen Reactions   Sulfa Antibiotics Other (See Comments)   Bee Venom Swelling and Other (See Comments)    Red and hot to site   Fish Allergy Hives   Ivp Dye [Iodinated Contrast Media] Other (See Comments)    MD advised not to use, due to fish allergies     CURRENT MEDICATIONS:  Outpatient Encounter Medications as of 05/12/2023  Medication Sig Note   acetaminophen (TYLENOL) 500 MG tablet Take 1,000-1,500 mg by mouth every 6 (six) hours as needed for moderate pain or headache.     amLODipine (NORVASC) 5 MG tablet Take 1 tablet (5 mg total) by mouth daily.    anastrozole (ARIMIDEX) 1 MG tablet Take 1 tablet (1 mg total) by mouth daily.    Ascorbic Acid (VITAMIN C) 100 MG tablet Take 100 mg by mouth daily.    atorvastatin (LIPITOR) 20 MG tablet Take 1 tablet (20 mg total) by mouth daily.    calcium carbonate (OSCAL) 1500 (600 Ca) MG TABS tablet Take 600 mg by mouth 2 (two) times daily with a meal.    Cholecalciferol (VITAMIN D3 PO) Take 1 capsule by mouth daily. 12/09/2021: 1000 IU   diphenhydrAMINE (BENADRYL) 25 MG tablet Take 25 mg by mouth once.    EPINEPHRINE 0.3 mg/0.3 mL IJ SOAJ injection Inject 0.3 mLs (0.3 mgttotal)  into the muscle for 1 dose.    fenofibrate (TRICOR) 48 MG tablet Take 1 tablet (48 mg total) by mouth daily.    ibuprofen (ADVIL) 800 MG tablet Take 1 tablet (800 mg total) by mouth every 8 (eight) hours as needed.    levothyroxine (SYNTHROID) 150 MCG tablet Take 1 tablet (150 mcg total) by mouth daily.    montelukast (SINGULAIR) 10 MG tablet TAKE ONE TABLET BY MOUTH AT BEDTIME    Multiple Vitamin (MULTIVITAMIN) tablet Take 1 tablet by mouth daily.    oxyCODONE (OXY IR/ROXICODONE) 5 MG immediate release tablet Take 1 tablet (5 mg total) by mouth every 6 (six) hours as needed for severe pain (pain score 7-10).    promethazine-dextromethorphan (PROMETHAZINE-DM) 6.25-15 MG/5ML syrup Take 5 mLs by mouth 4 (four) times daily as needed for cough.    triamcinolone (NASACORT) 55 MCG/ACT AERO nasal inhaler Place 2 sprays into the nose 2 (two) times daily.    triamcinolone ointment (KENALOG) 0.1 % Apply 1 Application topically 2 (two) times daily.    zinc gluconate 50 MG tablet Take 50  mg by mouth daily.    No facility-administered encounter medications on file as of 05/12/2023.     ONCOLOGIC FAMILY HISTORY:  Family History  Problem Relation Age of Onset   Hypertension Mother    Lung disease Father      GENETIC COUNSELING/TESTING: Not at this time  SOCIAL HISTORY:  Social History   Socioeconomic History   Marital status: Single    Spouse name: Not on file   Number of children: 0   Years of education: Not on file   Highest education level: Not on file  Occupational History   Occupation: retired    Comment: 2017  Tobacco Use   Smoking status: Never   Smokeless tobacco: Never  Vaping Use   Vaping status: Never Used  Substance and Sexual Activity   Alcohol use: No   Drug use: No   Sexual activity: Not Currently    Comment: not in relationship currently  Other Topics Concern   Not on file  Social History Narrative   Not on file   Social Determinants of Health   Financial  Resource Strain: Low Risk  (12/11/2022)   Overall Financial Resource Strain (CARDIA)    Difficulty of Paying Living Expenses: Not hard at all  Food Insecurity: No Food Insecurity (12/11/2022)   Hunger Vital Sign    Worried About Running Out of Food in the Last Year: Never true    Ran Out of Food in the Last Year: Never true  Transportation Needs: No Transportation Needs (12/11/2022)   PRAPARE - Administrator, Civil Service (Medical): No    Lack of Transportation (Non-Medical): No  Physical Activity: Insufficiently Active (12/11/2022)   Exercise Vital Sign    Days of Exercise per Week: 3 days    Minutes of Exercise per Session: 30 min  Stress: No Stress Concern Present (12/11/2022)   [employer] of Occupational Health - Occupational Stress Questionnaire    Feeling of Stress : Not at all  Social Connections: Moderately Isolated (12/11/2022)   Social Connection and Isolation Panel [NHANES]    Frequency of Communication with Friends and Family: More than three times a week    Frequency of Social Gatherings with Friends and Family: More than three times a week    Attends Religious Services: More than 4 times per year    Active Member of [employer] or Organizations: No    Attends Banker Meetings: Never    Marital Status: Never married  Intimate Partner Violence: Not At Risk (12/11/2022)   Humiliation, Afraid, Rape, and Kick questionnaire    Fear of Current or Ex-Partner: No    Emotionally Abused: No    Physically Abused: No    Sexually Abused: No     OBSERVATIONS/OBJECTIVE:  There were no vitals taken for this visit.  Physical exam deferred, telephone visit LABORATORY DATA:  None for this visit.  DIAGNOSTIC IMAGING:  None for this visit.      ASSESSMENT AND PLAN:    Laurie Mckenzie is a pleasant 72 y.o. female with Stage IA left breast invasive ductal carcinoma, ER+/PR+/HER2-, diagnosed in 02/2021, treated with lumpectomy, adjuvant radiation therapy, and  anti-estrogen therapy with Anastrozole beginning in 05/2021.   She is tolerating anastrozole well except for intermittent hot flashes.  She will complete 5 years of anastrozole in January 2028. Since her last visit, she had a mammogram and ultrasound which showed a left breast mass measuring 1.1 cm highly suspicious and surgical excision was recommended.  She had lumpectomy, pathology benign, fibrous deposition and healing fat necrosis noted.  No evidence of malignancy.  I recommended that she continue anastrozole and return to clinic as scheduled.   Total encounter time: in face to face visit time, chart review, lab review, care coordination, and documentation of the encounter.   I connected with  Shavon A Weihe on 05/12/23 by a video enabled telemedicine application and verified that I am speaking with the correct person using two identifiers.   I discussed the limitations of evaluation and management by telemedicine. The patient expressed understanding and agreed to proceed.  *Total Encounter Time as defined by the Centers for Medicare and Medicaid Services includes, in addition to the face-to-face time of a patient visit (documented in the note above) non-face-to-face time: obtaining and reviewing outside history, ordering and reviewing medications, tests or procedures, care coordination (communications with other health care professionals or caregivers) and documentation in the medical record.

## 2023-05-17 ENCOUNTER — Encounter: Payer: Self-pay | Admitting: Family Medicine

## 2023-05-21 ENCOUNTER — Ambulatory Visit
Admission: EM | Admit: 2023-05-21 | Discharge: 2023-05-21 | Disposition: A | Discharge status: Home / Self Care | Payer: Medicare Other | Attending: Nurse Practitioner | Admitting: Nurse Practitioner

## 2023-05-21 DIAGNOSIS — R059 Cough, unspecified: Secondary | ICD-10-CM | POA: Insufficient documentation

## 2023-05-21 DIAGNOSIS — J069 Acute upper respiratory infection, unspecified: Secondary | ICD-10-CM

## 2023-05-21 DIAGNOSIS — Z1152 Encounter for screening for COVID-19: Secondary | ICD-10-CM | POA: Diagnosis not present

## 2023-05-21 DIAGNOSIS — J309 Allergic rhinitis, unspecified: Secondary | ICD-10-CM

## 2023-05-21 DIAGNOSIS — R052 Subacute cough: Secondary | ICD-10-CM

## 2023-05-21 MED ORDER — CETIRIZINE HCL 10 MG PO TABS: 10.0000 mg | ORAL_TABLET | Freq: Every day | ORAL | 0 refills | Status: DC

## 2023-05-21 MED ORDER — PSEUDOEPH-BROMPHEN-DM 30-2-10 MG/5ML PO SYRP: 5.0000 mL | ORAL_SOLUTION | Freq: Four times a day (QID) | ORAL | 0 refills | Status: DC | PRN

## 2023-05-21 MED ORDER — AMOXICILLIN-POT CLAVULANATE 875-125 MG PO TABS: 1.0000 | ORAL_TABLET | Freq: Two times a day (BID) | ORAL | 0 refills | Status: AC

## 2023-05-21 NOTE — ED Triage Notes (Signed)
Pt reports sinus pressure, throat hurting, runnny nose, cough and bilateral ear pain x 1  day      Took cough syrup, benadryl, and mucinex

## 2023-05-21 NOTE — ED Provider Notes (Signed)
[facility]    CSN: [id number] Arrival date & time: 05/21/23  0801      History   Chief Complaint No chief complaint on file.   HPI Laurie Mckenzie is a 72 y.o. female.   The history is provided by the patient.   Patient presents for complaints of runny nose, nasal congestion, sinus pressure, bilateral ear pain, and cough that started over the past 24 hours.  Patient denies fever, chills, ear drainage, wheezing, difficulty breathing, abdominal pain, nausea, vomiting, diarrhea, or rash.  Patient reports that she has been in and out of doctors offices and in the hospital for recent surgery, unsure of any obvious known sick contacts.  Reports she has been taking cough syrup, Benadryl, and Mucinex for her symptoms.  Patient with history of breast cancer.  Past Medical History:  Diagnosis Date   Cancer [facility])    Headache    Hypertension    186/106 today    195/108 07/27/2017   Hypothyroidism    Primary osteoarthritis of knee    Right    Patient Active Problem List   Diagnosis Date Noted   Osteopenia 11/27/2022   Hypertriglyceridemia 07/17/2022   Malignant neoplasm of upper-outer quadrant of left breast in female, estrogen receptor positive (HCC) 03/17/2021   Primary localized osteoarthritis of right knee 08/23/2017   Hypothyroidism    Hypertension    Chronic allergic rhinitis 03/18/2015   Obesity (BMI 30-39.9) 02/20/2014   Hyperlipidemia 12/25/2013    Past Surgical History:  Procedure Laterality Date   BREAST LUMPECTOMY WITH RADIOACTIVE SEED AND SENTINEL LYMPH NODE BIOPSY Left 03/27/2021   Procedure: LEFT BREAST LUMPECTOMY WITH RADIOACTIVE SEED AND SENTINEL LYMPH NODE BIOPSY;  Surgeon: Harriette Bouillon, MD;  Location: [facility];  Service: General;  Laterality: Left;   BREAST LUMPECTOMY WITH RADIOACTIVE SEED LOCALIZATION Left 05/05/2023   Procedure: RADIOACTIVE SEED GUIDED LEFT BREAST LUMPECTOMY;  Surgeon: Harriette Bouillon, MD;  Location: [facility];  Service: General;  Laterality: Left;   COLONOSCOPY N/A 01/16/2015   Procedure: COLONOSCOPY;  Surgeon: Corbin Ade, MD;  [facility];  Service: Endoscopy;  Laterality: N/A;  8:30 AM   KNEE ARTHROPLASTY Left 2011   [place]   TOTAL KNEE ARTHROPLASTY Right 08/23/2017   Procedure: TOTAL KNEE ARTHROPLASTY;  Surgeon: Salvatore Marvel, MD;  Location: [facility] OR;  Service: Orthopedics;  Laterality: Right;    OB History   No obstetric history on file.      Home Medications    Prior to Admission medications   Medication Sig Start Date End Date Taking? Authorizing Provider  amoxicillin-clavulanate (AUGMENTIN) 875-125 MG tablet Take 1 tablet by mouth every 12 (twelve) hours for 7 days. 05/26/23 06/02/23 Yes Leath-Warren, Sadie Haber, NP  brompheniramine-pseudoephedrine-DM 30-2-10 MG/5ML syrup Take 5 mLs by mouth 4 (four) times daily as needed. 05/21/23  Yes Leath-Warren, Sadie Haber, NP  cetirizine (ZYRTEC) 10 MG tablet Take 1 tablet (10 mg total) by mouth daily. 05/21/23  Yes Leath-Warren, Sadie Haber, NP  acetaminophen (TYLENOL) 500 MG tablet Take 1,000-1,500 mg by mouth every 6 (six) hours as needed for moderate pain or headache.     [provider]  amLODipine (NORVASC) 5 MG tablet Take 1 tablet (5 mg total) by mouth daily. 07/17/22   Dettinger, Elige Radon, MD  anastrozole (ARIMIDEX) 1 MG tablet Take 1 tablet (1 mg total) by mouth daily. 06/17/22   Rachel Moulds, MD  Ascorbic Acid (VITAMIN C) 100 MG tablet Take  100 mg by mouth daily.    [provider]  atorvastatin (LIPITOR) 20 MG tablet Take 1 tablet (20 mg total) by mouth daily. 07/17/22   Dettinger, Elige Radon, MD  calcium carbonate (OSCAL) 1500 (600 Ca) MG TABS tablet Take 600 mg by mouth 2 (two) times daily with a meal.    [provider]  Cholecalciferol (VITAMIN D3 PO) Take 1 capsule by mouth daily.    [provider]  diphenhydrAMINE (BENADRYL) 25 MG tablet Take 25 mg by mouth  once.    [provider]  EPINEPHRINE 0.3 mg/0.3 mL IJ SOAJ injection Inject 0.3 mLs (0.3 mgttotal) into the muscle for 1 dose. 03/29/23   Dettinger, Elige Radon, MD  fenofibrate (TRICOR) 48 MG tablet Take 1 tablet (48 mg total) by mouth daily. 01/18/23   Dettinger, Elige Radon, MD  ibuprofen (ADVIL) 800 MG tablet Take 1 tablet (800 mg total) by mouth every 8 (eight) hours as needed. 03/27/21   Cornett, Maisie Fus, MD  levothyroxine (SYNTHROID) 150 MCG tablet Take 1 tablet (150 mcg total) by mouth daily. 07/17/22   Dettinger, Elige Radon, MD  montelukast (SINGULAIR) 10 MG tablet TAKE ONE TABLET BY MOUTH AT BEDTIME 03/25/23   Dettinger, Elige Radon, MD  Multiple Vitamin (MULTIVITAMIN) tablet Take 1 tablet by mouth daily.    [provider]  oxyCODONE (OXY IR/ROXICODONE) 5 MG immediate release tablet Take 1 tablet (5 mg total) by mouth every 6 (six) hours as needed for severe pain (pain score 7-10). 05/05/23   Harriette Bouillon, MD  promethazine-dextromethorphan (PROMETHAZINE-DM) 6.25-15 MG/5ML syrup Take 5 mLs by mouth 4 (four) times daily as needed for cough. 06/25/22   Leath-Warren, Sadie Haber, NP  triamcinolone (NASACORT) 55 MCG/ACT AERO nasal inhaler Place 2 sprays into the nose 2 (two) times daily.    [provider]  triamcinolone ointment (KENALOG) 0.1 % Apply 1 Application topically 2 (two) times daily. 12/21/22   Valentino Nose, NP  zinc gluconate 50 MG tablet Take 50 mg by mouth daily.    [provider]    Family History Family History  Problem Relation Age of Onset   Hypertension Mother    Lung disease Father     Social History Social History   Tobacco Use   Smoking status: Never   Smokeless tobacco: Never  Vaping Use   Vaping status: Never Used  Substance Use Topics   Alcohol use: No   Drug use: No     Allergies   Sulfa antibiotics, Bee venom, Fish allergy, and Ivp dye [iodinated contrast media]   Review of Systems Review of Systems Per  HPI  Physical Exam Triage Vital Signs ED Triage Vitals  Encounter Vitals Group     BP 05/21/23 0820 (!) 178/99     Systolic BP Percentile --      Diastolic BP Percentile --      Pulse Rate 05/21/23 0820 97     Resp 05/21/23 0820 20     Temp 05/21/23 0820 98.3 F (36.8 C)     Temp Source 05/21/23 0820 Oral     SpO2 05/21/23 0820 95 %     Weight --      Height --      Head Circumference --      Peak Flow --      Pain Score 05/21/23 0822 4     Pain Loc --      Pain Education --      Exclude from [employer]  Chart --    No data found.  Updated Vital Signs BP (!) 178/99 (BP Location: Right Arm)   Pulse 97   Temp 98.3 F (36.8 C) (Oral)   Resp 20   SpO2 95%   Visual Acuity Right Eye Distance:   Left Eye Distance:   Bilateral Distance:    Right Eye Near:   Left Eye Near:    Bilateral Near:     Physical Exam Vitals and nursing note reviewed.  Constitutional:      General: She is not in acute distress.    Appearance: Normal appearance.  HENT:     Head: Normocephalic.     Right Ear: Tympanic membrane, ear canal and external ear normal.     Left Ear: Tympanic membrane, ear canal and external ear normal.     Nose: Congestion present.     Right Turbinates: Enlarged and swollen.     Left Turbinates: Enlarged and swollen.     Right Sinus: No maxillary sinus tenderness or frontal sinus tenderness.     Left Sinus: No maxillary sinus tenderness or frontal sinus tenderness.     Mouth/Throat:     Lips: Pink.     Mouth: Mucous membranes are moist.     Pharynx: Oropharynx is clear. Posterior oropharyngeal erythema and postnasal drip present. No oropharyngeal exudate.     Comments: Cobblestoning present to posterior oropharynx  Eyes:     Extraocular Movements: Extraocular movements intact.     Conjunctiva/sclera: Conjunctivae normal.     Pupils: Pupils are equal, round, and reactive to light.  Cardiovascular:     Rate and Rhythm: Normal rate and regular rhythm.     Pulses:  Normal pulses.     Heart sounds: Normal heart sounds.  Pulmonary:     Effort: Pulmonary effort is normal. No respiratory distress.     Breath sounds: Normal breath sounds. No stridor. No wheezing, rhonchi or rales.  Abdominal:     General: Bowel sounds are normal.     Palpations: Abdomen is soft.     Tenderness: There is no abdominal tenderness.  Musculoskeletal:     Cervical back: Normal range of motion.  Lymphadenopathy:     Cervical: No cervical adenopathy.  Skin:    General: Skin is warm and dry.  Neurological:     General: No focal deficit present.     Mental Status: She is alert and oriented to person, place, and time.  Psychiatric:        Mood and Affect: Mood normal.        Behavior: Behavior normal.      UC Treatments / Results  Labs (all labs ordered are listed, but only abnormal results are displayed) Labs Reviewed - No data to display  EKG   Radiology No results found.  Procedures Procedures (including critical care time)  Medications Ordered in UC Medications - No data to display  Initial Impression / Assessment and Plan / UC Course  I have reviewed the triage vital signs and the nursing notes.  Pertinent labs & imaging results that were available during my care of the patient were reviewed by me and considered in my medical decision making (see chart for details).  COVID test is pending.  Symptoms consistent with viral URI at this time.  Will provide symptomatic treatment with Bromfed-DM, and cetirizine 10 mg.  Patient has Flonase that she will begin taking at home.  Given the patient's underlying history of cancer, advised patient that if symptoms  have not improved over the next 5 days, a prescription for Augmentin has been sent to her preferred pharmacy to start.  Supportive care recommendations were provided and discussed with the patient to include normal saline nasal spray, over-the-counter Tylenol for pain or discomfort, warm salt water gargles, and  use of a humidifier in her bedroom at nighttime during sleep.  Discussed indications with the patient open follow-up be necessary.  Patient was in agreement with this plan of care and verbalized understanding.  All questions were answered.  Patient stable for discharge.  Final Clinical Impressions(s) / UC Diagnoses   Final diagnoses:  Viral upper respiratory tract infection with cough     Discharge Instructions      Take medication as directed. Begin using Flonase as discussed. Increase fluids and get plenty of rest. May take over-the-counter ibuprofen or Tylenol as needed for pain, fever, or general discomfort. Recommend normal saline nasal spray to help with nasal congestion throughout the day. For your cough, it may be helpful to use a humidifier at bedtime during sleep. If your symptoms have not improved by 05/26/2023, a prescription for Augmentin has been sent to your preferred pharmacy to pick up and begin. If symptoms have not improved after completing this treatment, please follow-up with your primary care physician or return to this clinic for further evaluation. Follow-up as needed.     ED Prescriptions     Medication Sig Dispense Auth. Provider   brompheniramine-pseudoephedrine-DM 30-2-10 MG/5ML syrup Take 5 mLs by mouth 4 (four) times daily as needed. 140 mL Leath-Warren, Sadie Haber, NP   cetirizine (ZYRTEC) 10 MG tablet Take 1 tablet (10 mg total) by mouth daily. 30 tablet Leath-Warren, Sadie Haber, NP   amoxicillin-clavulanate (AUGMENTIN) 875-125 MG tablet Take 1 tablet by mouth every 12 (twelve) hours for 7 days. 14 tablet Leath-Warren, Sadie Haber, NP      PDMP not reviewed this encounter.   Abran Cantor, NP 05/21/23 [phone]

## 2023-05-21 NOTE — Discharge Instructions (Addendum)
Take medication as directed. Begin using Flonase as discussed. Increase fluids and get plenty of rest. May take over-the-counter ibuprofen or Tylenol as needed for pain, fever, or general discomfort. Recommend normal saline nasal spray to help with nasal congestion throughout the day. For your cough, it may be helpful to use a humidifier at bedtime during sleep. If your symptoms have not improved by 05/26/2023, a prescription for Augmentin has been sent to your preferred pharmacy to pick up and begin. If symptoms have not improved after completing this treatment, please follow-up with your primary care physician or return to this clinic for further evaluation. Follow-up as needed.

## 2023-05-22 LAB — SARS CORONAVIRUS 2 (TAT 6-24 HRS): SARS Coronavirus 2: NEGATIVE

## 2023-06-17 ENCOUNTER — Other Ambulatory Visit: Payer: Self-pay | Admitting: Hematology and Oncology

## 2023-07-12 ENCOUNTER — Ambulatory Visit: Payer: Medicare Other | Attending: Surgery

## 2023-07-12 VITALS — Wt 225.1 lb

## 2023-07-12 DIAGNOSIS — Z483 Aftercare following surgery for neoplasm: Secondary | ICD-10-CM | POA: Insufficient documentation

## 2023-07-12 NOTE — Therapy (Signed)
 OUTPATIENT PHYSICAL THERAPY SOZO SCREENING NOTE   Patient Name: Laurie Mckenzie MRN: [id number] DOB:September 18, 1950, 73 y.o., female Today's Date: 07/12/2023  PCP: Dettinger, Fonda LABOR, MD REFERRING PROVIDER: Vanderbilt Ned, MD   PT End of Session - 07/12/23 [phone]     Visit Number 2   # unchanged due to screen only   PT Start Time 0803    PT Stop Time 0808    PT Time Calculation (min) 5 min    Activity Tolerance Patient tolerated treatment well    Behavior During Therapy [facility] for tasks assessed/performed             Past Medical History:  Diagnosis Date   Cancer (HCC)    Headache    Hypertension    186/106 today    195/108 07/27/2017   Hypothyroidism    Primary osteoarthritis of knee    Right   Past Surgical History:  Procedure Laterality Date   BREAST LUMPECTOMY WITH RADIOACTIVE SEED AND SENTINEL LYMPH NODE BIOPSY Left 03/27/2021   Procedure: LEFT BREAST LUMPECTOMY WITH RADIOACTIVE SEED AND SENTINEL LYMPH NODE BIOPSY;  Surgeon: Vanderbilt Ned, MD;  Location: [facility];  Service: General;  Laterality: Left;   BREAST LUMPECTOMY WITH RADIOACTIVE SEED LOCALIZATION Left 05/05/2023   Procedure: RADIOACTIVE SEED GUIDED LEFT BREAST LUMPECTOMY;  Surgeon: Vanderbilt Ned, MD;  Location: [facility];  Service: General;  Laterality: Left;   COLONOSCOPY N/A 01/16/2015   Procedure: COLONOSCOPY;  Surgeon: Lamar CHRISTELLA Hollingshead, MD;  Location: [facility];  Service: Endoscopy;  Laterality: N/A;  8:30 AM   KNEE ARTHROPLASTY Left 2011   [place]   TOTAL KNEE ARTHROPLASTY Right 08/23/2017   Procedure: TOTAL KNEE ARTHROPLASTY;  Surgeon: Jane Lamar, MD;  Location: [facility];  Service: Orthopedics;  Laterality: Right;   Patient Active Problem List   Diagnosis Date Noted   Cough 05/21/2023   Osteopenia 11/27/2022   Hypertriglyceridemia 07/17/2022   Malignant neoplasm of upper-outer quadrant of left breast in female, estrogen receptor positive (HCC) 03/17/2021   Primary  localized osteoarthritis of right knee 08/23/2017   Hypothyroidism    Hypertension    Chronic allergic rhinitis 03/18/2015   Obesity (BMI 30-39.9) 02/20/2014   Hyperlipidemia 12/25/2013    REFERRING DIAG: left breast cancer at risk for lymphedema  THERAPY DIAG: Aftercare following surgery for neoplasm  PERTINENT HISTORY: Patient was diagnosed on 02/13/2021 with 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 She underwent a left lumpectomy and sentinel node biopsy on 03/27/2021 with 2 negative nodes removed. It is ER/PR positive and HER2 negative with a Ki67 of 15%  PRECAUTIONS: left UE Lymphedema risk, None  SUBJECTIVE: Pt returns for her first 6 month L-Dex screen.  Dr. Vanderbilt had to remove a new spot in my breast but it ended up being fatty tissue and scar tissue.  PAIN:  Are you having pain? No  SOZO SCREENING: Patient was assessed today using the SOZO machine to determine the lymphedema index score. This was compared to her baseline score. It was determined that she is within the recommended range when compared to her baseline and no further action is needed at this time. She will continue SOZO screenings. These are done every 3 months for 2 years post operatively followed by every 6 months for 2 years, and then annually.   L-DEX FLOWSHEETS - 07/12/23 0800       L-DEX LYMPHEDEMA SCREENING   Measurement Type Unilateral    L-DEX MEASUREMENT EXTREMITY Upper Extremity  POSITION  Standing    DOMINANT SIDE Right    At Risk Side Left    BASELINE SCORE (UNILATERAL) 2.5    L-DEX SCORE (UNILATERAL) 1.6    VALUE CHANGE (UNILAT) -0.9            P: Cont 6 month L-Dex screens next.    Aden Berwyn Caldron, PTA 07/12/2023, 8:08 AM

## 2023-08-02 ENCOUNTER — Other Ambulatory Visit: Payer: Medicare Other

## 2023-08-02 DIAGNOSIS — E782 Mixed hyperlipidemia: Secondary | ICD-10-CM | POA: Diagnosis not present

## 2023-08-02 DIAGNOSIS — I10 Essential (primary) hypertension: Secondary | ICD-10-CM | POA: Diagnosis not present

## 2023-08-02 DIAGNOSIS — E039 Hypothyroidism, unspecified: Secondary | ICD-10-CM | POA: Diagnosis not present

## 2023-08-02 DIAGNOSIS — E559 Vitamin D deficiency, unspecified: Secondary | ICD-10-CM | POA: Diagnosis not present

## 2023-08-03 LAB — CMP14+EGFR
ALT: 49 [IU]/L — ABNORMAL HIGH (ref 0–32)
AST: 42 [IU]/L — ABNORMAL HIGH (ref 0–40)
Albumin: 4.6 g/dL (ref 3.8–4.8)
Alkaline Phosphatase: 86 [IU]/L (ref 44–121)
BUN/Creatinine Ratio: 15 (ref 12–28)
BUN: 13 mg/dL (ref 8–27)
Bilirubin Total: 0.2 mg/dL (ref 0.0–1.2)
CO2: 24 mmol/L (ref 20–29)
Calcium: 10 mg/dL (ref 8.7–10.3)
Chloride: 99 mmol/L (ref 96–106)
Creatinine, Ser: 0.84 mg/dL (ref 0.57–1.00)
Globulin, Total: 2.5 g/dL (ref 1.5–4.5)
Glucose: 118 mg/dL — ABNORMAL HIGH (ref 70–99)
Potassium: 4.6 mmol/L (ref 3.5–5.2)
Sodium: 140 mmol/L (ref 134–144)
Total Protein: 7.1 g/dL (ref 6.0–8.5)
eGFR: 74 mL/min/{1.73_m2} (ref >59)

## 2023-08-03 LAB — CBC WITH DIFFERENTIAL/PLATELET
Basophils Absolute: 0.1 10*3/uL (ref 0.0–0.2)
Basos: 1 %
EOS (ABSOLUTE): 0.2 10*3/uL (ref 0.0–0.4)
Eos: 3 %
Hematocrit: 44.3 % (ref 34.0–46.6)
Hemoglobin: 14.5 g/dL (ref 11.1–15.9)
Immature Grans (Abs): 0.1 10*3/uL (ref 0.0–0.1)
Immature Granulocytes: 1 %
Lymphocytes Absolute: 1.7 10*3/uL (ref 0.7–3.1)
Lymphs: 25 %
MCH: 30.9 pg (ref 26.6–33.0)
MCHC: 32.7 g/dL (ref 31.5–35.7)
MCV: 95 fL (ref 79–97)
Monocytes Absolute: 0.6 10*3/uL (ref 0.1–0.9)
Monocytes: 10 %
Neutrophils Absolute: 4.1 10*3/uL (ref 1.4–7.0)
Neutrophils: 60 %
Platelets: 277 10*3/uL (ref 150–450)
RBC: 4.69 x10E6/uL (ref 3.77–5.28)
RDW: 12.6 % (ref 11.7–15.4)
WBC: 6.7 10*3/uL (ref 3.4–10.8)

## 2023-08-03 LAB — LIPID PANEL
Chol/HDL Ratio: 3.6 {ratio} (ref 0.0–4.4)
Cholesterol, Total: 140 mg/dL (ref 100–199)
HDL: 39 mg/dL — ABNORMAL LOW (ref >39)
LDL Chol Calc (NIH): 72 mg/dL (ref 0–99)
Triglycerides: 169 mg/dL — ABNORMAL HIGH (ref 0–149)
VLDL Cholesterol Cal: 29 mg/dL (ref 5–40)

## 2023-08-03 LAB — TSH: TSH: 1.77 u[IU]/mL (ref 0.450–4.500)

## 2023-08-03 LAB — VITAMIN D 25 HYDROXY (VIT D DEFICIENCY, FRACTURES): Vit D, 25-Hydroxy: 61.2 ng/mL (ref 30.0–100.0)

## 2023-08-05 ENCOUNTER — Ambulatory Visit: Payer: Medicare Other | Admitting: Family Medicine

## 2023-08-05 ENCOUNTER — Encounter: Payer: Self-pay | Admitting: Family Medicine

## 2023-08-05 VITALS — BP 158/85 | HR 94 | Ht 64.0 in | Wt 225.0 lb

## 2023-08-05 DIAGNOSIS — J309 Allergic rhinitis, unspecified: Secondary | ICD-10-CM | POA: Diagnosis not present

## 2023-08-05 DIAGNOSIS — E039 Hypothyroidism, unspecified: Secondary | ICD-10-CM | POA: Diagnosis not present

## 2023-08-05 DIAGNOSIS — Z13820 Encounter for screening for osteoporosis: Secondary | ICD-10-CM

## 2023-08-05 DIAGNOSIS — E782 Mixed hyperlipidemia: Secondary | ICD-10-CM | POA: Diagnosis not present

## 2023-08-05 DIAGNOSIS — I10 Essential (primary) hypertension: Secondary | ICD-10-CM | POA: Diagnosis not present

## 2023-08-05 DIAGNOSIS — M85859 Other specified disorders of bone density and structure, unspecified thigh: Secondary | ICD-10-CM

## 2023-08-05 DIAGNOSIS — E781 Pure hyperglyceridemia: Secondary | ICD-10-CM

## 2023-08-05 DIAGNOSIS — S46211A Strain of muscle, fascia and tendon of other parts of biceps, right arm, initial encounter: Secondary | ICD-10-CM | POA: Diagnosis not present

## 2023-08-05 DIAGNOSIS — Z780 Asymptomatic menopausal state: Secondary | ICD-10-CM

## 2023-08-05 MED ORDER — AMLODIPINE BESYLATE 5 MG PO TABS: 5.0000 mg | ORAL_TABLET | Freq: Every day | ORAL | 3 refills | Status: AC

## 2023-08-05 MED ORDER — LEVOTHYROXINE SODIUM 150 MCG PO TABS: 150.0000 ug | ORAL_TABLET | Freq: Every day | ORAL | 3 refills | Status: AC

## 2023-08-05 MED ORDER — ATORVASTATIN CALCIUM 20 MG PO TABS: 20.0000 mg | ORAL_TABLET | Freq: Every day | ORAL | 3 refills | Status: AC

## 2023-08-05 NOTE — Progress Notes (Signed)
 BP (!) 158/85   Pulse 94   Ht 5' 4 (1.626 m)   Wt 225 lb (102.1 kg)   SpO2 96%   BMI 38.62 kg/m    Subjective:   Patient ID: Laurie Mckenzie, female    DOB: 05-06-1951, 73 y.o.   MRN: [id number]  HPI: Laurie Mckenzie is a 73 y.o. female presenting on 08/05/2023 for Medical Management of Chronic Issues, Hypothyroidism, and Hypertension   HPI Hypertension Patient is currently on amlodipine , and their blood pressure today is 158/85, she says it runs in the 140s over 70s at home most of the time. Patient denies any lightheadedness or dizziness. Patient denies headaches, blurred vision, chest pains, shortness of breath, or weakness. Denies any side effects from medication and is content with current medication.   Hyperlipidemia Patient is coming in for recheck of his hyperlipidemia. The patient is currently taking atorvastatin  and fenofibrate . They deny any issues with myalgias or history of liver damage from it. They deny any focal numbness or weakness or chest pain.   Hypothyroidism recheck Patient is coming in for thyroid  recheck today as well. They deny any issues with hair changes or heat or cold problems or diarrhea or constipation. They deny any chest pain or palpitations. They are currently on levothyroxine  150 micrograms   Morbid obesity checkup Patient's hypertension and hyperlipidemia and fatty liver are more complicated by the patient's morbid obesity.  Discussed weight loss and lifestyle modification and exercise with the patient.  Patient has right shoulder pain.  She has been bothering her for a month.  It has been hurting in the front of her right shoulder and then down into her bicep muscle.  She cannot recall anything specific that she did to injure it or irritated.  She says it hurts with certain range of motion's.  She denies any numbness or weakness.  Cough and congestion Patient's allergies have been acting up more since the season change to winter.  She is having more  cough and congestion and sinus drainage.  She is using her Flonase in the morning and Benadryl  at night and Zyrtec  and Singulair .  She says she gets coughing mostly at night when she lays down so she has been sleeping up in a chair.  Relevant past medical, surgical, family and social history reviewed and updated as indicated. Interim medical history since our last visit reviewed. Allergies and medications reviewed and updated.  Review of Systems  Constitutional:  Negative for chills and fever.  HENT:  Positive for congestion, postnasal drip, rhinorrhea, sinus pressure, sneezing and sore throat. Negative for ear discharge and ear pain.   Eyes:  Negative for pain, redness and visual disturbance.  Respiratory:  Positive for cough. Negative for chest tightness and shortness of breath.   Cardiovascular:  Negative for chest pain and leg swelling.  Genitourinary:  Negative for difficulty urinating and dysuria.  Musculoskeletal:  Negative for back pain and gait problem.  Skin:  Negative for rash.  Neurological:  Negative for light-headedness and headaches.  Psychiatric/Behavioral:  Negative for agitation and behavioral problems.   All other systems reviewed and are negative.   Per HPI unless specifically indicated above   Allergies as of 08/05/2023       Reactions   Sulfa Antibiotics Other (See Comments)   Bee Venom Swelling, Other (See Comments)   Red and hot to site   Fish Allergy Hives   Ivp Dye [iodinated Contrast Media] Other (See Comments)   MD advised  not to use, due to fish allergies        Medication List        Accurate as of August 05, 2023  8:41 AM. If you have any questions, ask your nurse or doctor.          STOP taking these medications    brompheniramine-pseudoephedrine-DM 30-2-10 MG/5ML syrup Stopped by: Fonda LABOR Baker Kogler   oxyCODONE  5 MG immediate release tablet Commonly known as: Oxy IR/ROXICODONE  Stopped by: Fonda LABOR Hovanes Hymas    promethazine -dextromethorphan 6.25-15 MG/5ML syrup Commonly known as: PROMETHAZINE -DM Stopped by: Fonda LABOR Juliannah Ohmann       TAKE these medications    acetaminophen  500 MG tablet Commonly known as: TYLENOL  Take 1,000-1,500 mg by mouth every 6 (six) hours as needed for moderate pain or headache.   amLODipine  5 MG tablet Commonly known as: NORVASC  Take 1 tablet (5 mg total) by mouth daily.   anastrozole  1 MG tablet Commonly known as: ARIMIDEX  TAKE ONE TABLET ONCE DAILY   atorvastatin  20 MG tablet Commonly known as: LIPITOR Take 1 tablet (20 mg total) by mouth daily.   calcium  carbonate 1500 (600 Ca) MG Tabs tablet Commonly known as: OSCAL Take 600 mg by mouth 2 (two) times daily with a meal.   cetirizine  10 MG tablet Commonly known as: ZYRTEC  Take 1 tablet (10 mg total) by mouth daily.   diphenhydrAMINE  25 MG tablet Commonly known as: BENADRYL  Take 25 mg by mouth once.   EPINEPHrine  0.3 mg/0.3 mL Soaj injection Commonly known as: EPI-PEN Inject 0.3 mLs (0.3 mgttotal) into the muscle for 1 dose.   fenofibrate  48 MG tablet Commonly known as: Tricor  Take 1 tablet (48 mg total) by mouth daily.   ibuprofen  800 MG tablet Commonly known as: ADVIL  Take 1 tablet (800 mg total) by mouth every 8 (eight) hours as needed.   levothyroxine  150 MCG tablet Commonly known as: SYNTHROID  Take 1 tablet (150 mcg total) by mouth daily.   montelukast  10 MG tablet Commonly known as: SINGULAIR  TAKE ONE TABLET BY MOUTH AT BEDTIME   multivitamin tablet Take 1 tablet by mouth daily.   triamcinolone  55 MCG/ACT Aero nasal inhaler Commonly known as: NASACORT  Place 2 sprays into the nose 2 (two) times daily.   triamcinolone  ointment 0.1 % Commonly known as: KENALOG  Apply 1 Application topically 2 (two) times daily.   vitamin C 100 MG tablet Take 100 mg by mouth daily.   VITAMIN D3 PO Take 1 capsule by mouth daily.   zinc gluconate 50 MG tablet Take 50 mg by mouth daily.          Objective:   BP (!) 158/85   Pulse 94   Ht 5' 4 (1.626 m)   Wt 225 lb (102.1 kg)   SpO2 96%   BMI 38.62 kg/m   Wt Readings from Last 3 Encounters:  08/05/23 225 lb (102.1 kg)  07/12/23 225 lb 2 oz (102.1 kg)  05/05/23 226 lb 6.6 oz (102.7 kg)    Physical Exam Vitals and nursing note reviewed.  Constitutional:      General: She is not in acute distress.    Appearance: She is well-developed. She is not diaphoretic.  HENT:     Mouth/Throat:     Mouth: Mucous membranes are moist.     Pharynx: Oropharynx is clear. No oropharyngeal exudate or posterior oropharyngeal erythema.  Eyes:     Conjunctiva/sclera: Conjunctivae normal.  Cardiovascular:     Rate and Rhythm: Normal rate and regular  rhythm.     Heart sounds: Normal heart sounds. No murmur heard. Pulmonary:     Effort: Pulmonary effort is normal. No respiratory distress.     Breath sounds: Normal breath sounds. No wheezing.  Musculoskeletal:        General: No swelling or tenderness. Normal range of motion.  Skin:    General: Skin is warm and dry.     Findings: No rash.  Neurological:     Mental Status: She is alert and oriented to person, place, and time.     Coordination: Coordination normal.  Psychiatric:        Behavior: Behavior normal.     Results for orders placed or performed in visit on 08/02/23  CBC with Differential/Platelet   Collection Time: 08/02/23  8:28 AM  Result Value Ref Range   WBC 6.7 3.4 - 10.8 x10E3/uL   RBC 4.69 3.77 - 5.28 x10E6/uL   Hemoglobin 14.5 11.1 - 15.9 g/dL   Hematocrit 55.6 65.9 - 46.6 %   MCV 95 79 - 97 fL   MCH 30.9 26.6 - 33.0 pg   MCHC 32.7 31.5 - 35.7 g/dL   RDW 87.3 88.2 - 84.5 %   Platelets 277 150 - 450 x10E3/uL   Neutrophils 60 Not Estab. %   Lymphs 25 Not Estab. %   Monocytes 10 Not Estab. %   Eos 3 Not Estab. %   Basos 1 Not Estab. %   Neutrophils Absolute 4.1 1.4 - 7.0 x10E3/uL   Lymphocytes Absolute 1.7 0.7 - 3.1 x10E3/uL   Monocytes Absolute 0.6  0.1 - 0.9 x10E3/uL   EOS (ABSOLUTE) 0.2 0.0 - 0.4 x10E3/uL   Basophils Absolute 0.1 0.0 - 0.2 x10E3/uL   Immature Granulocytes 1 Not Estab. %   Immature Grans (Abs) 0.1 0.0 - 0.1 x10E3/uL  CMP14+EGFR   Collection Time: 08/02/23  8:28 AM  Result Value Ref Range   Glucose 118 (H) 70 - 99 mg/dL   BUN 13 8 - 27 mg/dL   Creatinine, Ser 9.15 0.57 - 1.00 mg/dL   eGFR 74 >40 fO/fpw/8.26   BUN/Creatinine Ratio 15 12 - 28   Sodium 140 134 - 144 mmol/L   Potassium 4.6 3.5 - 5.2 mmol/L   Chloride 99 96 - 106 mmol/L   CO2 24 20 - 29 mmol/L   Calcium  10.0 8.7 - 10.3 mg/dL   Total Protein 7.1 6.0 - 8.5 g/dL   Albumin 4.6 3.8 - 4.8 g/dL   Globulin, Total 2.5 1.5 - 4.5 g/dL   Bilirubin Total 0.2 0.0 - 1.2 mg/dL   Alkaline Phosphatase 86 44 - 121 IU/L   AST 42 (H) 0 - 40 IU/L   ALT 49 (H) 0 - 32 IU/L  Lipid panel   Collection Time: 08/02/23  8:28 AM  Result Value Ref Range   Cholesterol, Total 140 100 - 199 mg/dL   Triglycerides 830 (H) 0 - 149 mg/dL   HDL 39 (L) >60 mg/dL   VLDL Cholesterol Cal 29 5 - 40 mg/dL   LDL Chol Calc (NIH) 72 0 - 99 mg/dL   Chol/HDL Ratio 3.6 0.0 - 4.4 ratio  TSH   Collection Time: 08/02/23  8:28 AM  Result Value Ref Range   TSH 1.770 0.450 - 4.500 uIU/mL  VITAMIN D  25 Hydroxy (Vit-D Deficiency, Fractures)   Collection Time: 08/02/23  8:28 AM  Result Value Ref Range   Vit D, 25-Hydroxy 61.2 30.0 - 100.0 ng/mL    Assessment & Plan:  Problem List Items Addressed This Visit       Cardiovascular and Mediastinum   Hypertension   Relevant Medications   amLODipine  (NORVASC ) 5 MG tablet   atorvastatin  (LIPITOR) 20 MG tablet   Other Relevant Orders   CMP14+EGFR   Lipid panel     Respiratory   Chronic allergic rhinitis     Endocrine   Hypothyroidism - Primary   Relevant Medications   levothyroxine  (SYNTHROID ) 150 MCG tablet   Other Relevant Orders   TSH     Musculoskeletal and Integument   Osteopenia   Relevant Orders   DG [facility] DEXA     Other    Hyperlipidemia   Relevant Medications   amLODipine  (NORVASC ) 5 MG tablet   atorvastatin  (LIPITOR) 20 MG tablet   Other Relevant Orders   Lipid panel   Hypertriglyceridemia   Relevant Medications   amLODipine  (NORVASC ) 5 MG tablet   atorvastatin  (LIPITOR) 20 MG tablet   Other Relevant Orders   CBC with Differential/Platelet   Lipid panel   Other Visit Diagnoses       Encounter for osteoporosis screening in asymptomatic postmenopausal patient         Morbid obesity (HCC)         Strain of right biceps, initial encounter           For her biceps strain recommended stretching and Voltaren gel and time and if not improved over the next month for a week go to physical therapy.  For patient's chronic allergic rhinitis that slightly worse now recommended that she had a humidifier to room and use Flonase at night that of in the morning and double up on her Benadryl  at night.  Patient's BMI is >30 mg/m2.  Patient's current BMI is Body mass index is 38.62 kg/m.SABRA  Patient is currently enrolled in a healthy eating plan along with encouraged exercise.  Hypertension precludes her from taking phentermine patient has contraindications to phentermine, Contrave & Qsymia (contains phentermine).  Patient does not have a personal or family history of medullary thyroid  carcinoma (MTC) or Multiple Endocrine Neoplasia syndrome type 2 (MEN 2).    Follow up plan: Return in about 6 months (around 02/02/2024), or if symptoms worsen or fail to improve, for Hypertension and hyperlipidemia and hypothyroidism.  Counseling provided for all of the vaccine components Orders Placed This Encounter  Procedures   DG [facility] DEXA   CBC with Differential/Platelet   CMP14+EGFR   Lipid panel   TSH    Fonda Levins, MD [facility] 08/05/2023, 8:41 AM

## 2023-08-05 NOTE — Progress Notes (Signed)
 Discard, erroneous note

## 2023-08-26 ENCOUNTER — Other Ambulatory Visit: Payer: Self-pay | Admitting: *Deleted

## 2023-08-26 DIAGNOSIS — C50412 Malignant neoplasm of upper-outer quadrant of left female breast: Secondary | ICD-10-CM

## 2023-08-26 NOTE — Progress Notes (Unsigned)
 ONCOLOGIC HISTORY:  Oncology History  Malignant neoplasm of upper-outer quadrant of left breast in female, estrogen receptor positive (HCC)  03/12/2021 Initial Diagnosis   status post left breast upper outer quadrant biopsy 03/12/2021 for a clinical T1c N0, stage IA invasive ductal carcinoma, grade 2, estrogen and progesterone receptor positive, HER2 not amplified (FISH results pending) with an MIB-1 of 30%   03/19/2021 Cancer Staging   Staging form: Breast, AJCC 8th Edition - Clinical stage from 03/19/2021: Stage IA (cT1c, cN0, cM0, G2, ER+, PR+, HER2-) - Signed by Lowella Dell, MD on 03/19/2021 Stage prefix: Initial diagnosis Histologic grading system: 3 grade system Laterality: Left Staged by: Pathologist and managing physician Stage used in treatment planning: Yes National guidelines used in treatment planning: Yes Type of national guideline used in treatment planning: NCCN   03/27/2021 Cancer Staging   Staging form: Breast, AJCC 8th Edition - Pathologic stage from 03/27/2021: Stage IA (pT1b, pN0, cM0, G3, ER+, PR+, HER2-) - Signed by Loa Socks, NP on 07/19/2021 Histologic grading system: 3 grade system   03/27/2021 Surgery   status post left lumpectomy and sentinel lymph node sampling 03/27/2021 for a pT1b pN0, stage IA invasive ductal carcinoma, grade 3, with negative margins.             (a) 2 left axillary lymph nodes were removed     03/27/2021 Oncotype testing   Oncotype score of 24 predicts a risk of recurrence outside the breast over the next 9 years of 10% if the patient's only systemic therapy is antiestrogens for 5 years.  It also predicts minimal benefit from adjuvant chemotherapy   05/01/2021 - 06/03/2021 Radiation Therapy   05/01/2021 through 06/03/2021 Site Technique Total Dose (Gy) Dose per Fx (Gy) Completed Fx Beam Energies  Breast, Left: Breast_Lt 3D 42.56/42.56 2.66 16/16 10X  Breast, Left: Breast_Lt_Bst 3D 8/8 2 4/4 6X, 10X     05/29/2021 -   Anti-estrogen oral therapy   Anastrozole daily     INTERVAL HISTORY:   She is taking anastrozole daily with good tolerance.     REVIEW OF SYSTEMS:  Review of Systems  Constitutional:  Negative for appetite change, chills, fatigue, fever and unexpected weight change.  HENT:   Negative for hearing loss, lump/mass and trouble swallowing.   Eyes:  Negative for eye problems and icterus.  Respiratory:  Negative for chest tightness, cough and shortness of breath.   Cardiovascular:  Negative for chest pain, leg swelling and palpitations.  Gastrointestinal:  Negative for abdominal distention, abdominal pain, constipation, diarrhea, nausea and vomiting.  Endocrine: Positive for hot flashes.  Genitourinary:  Negative for difficulty urinating.   Musculoskeletal:  Negative for arthralgias.  Skin:  Negative for itching and rash.  Neurological:  Negative for dizziness, extremity weakness, headaches and numbness.  Hematological:  Negative for adenopathy. Does not bruise/bleed easily.  Psychiatric/Behavioral:  Negative for depression. The patient is not nervous/anxious.    Breast: Denies any new nodularity, masses, tenderness, nipple changes, or nipple discharge.   ONCOLOGY TREATMENT TEAM:  1. Surgeon:  Dr. Luisa Hart at [facility] 2. Medical Oncologist: Dr. Al Pimple  3. Radiation Oncologist: Dr. Mitzi Hansen    PAST MEDICAL/SURGICAL HISTORY:  Past Medical History:  Diagnosis Date   Cancer [facility])    Headache    Hypertension    186/106 today    195/108 07/27/2017   Hypothyroidism    Primary osteoarthritis of knee    Right   Past Surgical History:  Procedure Laterality Date  BREAST LUMPECTOMY WITH RADIOACTIVE SEED AND SENTINEL LYMPH NODE BIOPSY Left 03/27/2021   Procedure: LEFT BREAST LUMPECTOMY WITH RADIOACTIVE SEED AND SENTINEL LYMPH NODE BIOPSY;  Surgeon: Harriette Bouillon, MD;  Location: [facility];  Service: General;  Laterality: Left;   BREAST LUMPECTOMY WITH  RADIOACTIVE SEED LOCALIZATION Left 05/05/2023   Procedure: RADIOACTIVE SEED GUIDED LEFT BREAST LUMPECTOMY;  Surgeon: Harriette Bouillon, MD;  Location: [facility];  Service: General;  Laterality: Left;   COLONOSCOPY N/A 01/16/2015   Procedure: COLONOSCOPY;  Surgeon: Corbin Ade, MD;  [facility];  Service: Endoscopy;  Laterality: N/A;  8:30 AM   KNEE ARTHROPLASTY Left 2011   [place]   TOTAL KNEE ARTHROPLASTY Right 08/23/2017   Procedure: TOTAL KNEE ARTHROPLASTY;  Surgeon: Salvatore Marvel, MD;  Location[facility];  Service: Orthopedics;  Laterality: Right;     ALLERGIES:  Allergies  Allergen Reactions   Sulfa Antibiotics Other (See Comments)   Bee Venom Swelling and Other (See Comments)    Red and hot to site   Fish Allergy Hives   Ivp Dye [Iodinated Contrast Media] Other (See Comments)    MD advised not to use, due to fish allergies     CURRENT MEDICATIONS:  Outpatient Encounter Medications as of 08/27/2023  Medication Sig Note   acetaminophen (TYLENOL) 500 MG tablet Take 1,000-1,500 mg by mouth every 6 (six) hours as needed for moderate pain or headache.     amLODipine (NORVASC) 5 MG tablet Take 1 tablet (5 mg total) by mouth daily.    anastrozole (ARIMIDEX) 1 MG tablet TAKE ONE TABLET ONCE DAILY    Ascorbic Acid (VITAMIN C) 100 MG tablet Take 100 mg by mouth daily.    atorvastatin (LIPITOR) 20 MG tablet Take 1 tablet (20 mg total) by mouth daily.    calcium carbonate (OSCAL) 1500 (600 Ca) MG TABS tablet Take 600 mg by mouth 2 (two) times daily with a meal.    cetirizine (ZYRTEC) 10 MG tablet Take 1 tablet (10 mg total) by mouth daily.    Cholecalciferol (VITAMIN D3 PO) Take 1 capsule by mouth daily. 12/09/2021: 1000 IU   diphenhydrAMINE (BENADRYL) 25 MG tablet Take 25 mg by mouth once.    EPINEPHRINE 0.3 mg/0.3 mL IJ SOAJ injection Inject 0.3 mLs (0.3 mgttotal) into the muscle for 1 dose.    fenofibrate (TRICOR) 48 MG tablet Take 1 tablet (48 mg total) by  mouth daily.    ibuprofen (ADVIL) 800 MG tablet Take 1 tablet (800 mg total) by mouth every 8 (eight) hours as needed.    levothyroxine (SYNTHROID) 150 MCG tablet Take 1 tablet (150 mcg total) by mouth daily.    montelukast (SINGULAIR) 10 MG tablet TAKE ONE TABLET BY MOUTH AT BEDTIME    Multiple Vitamin (MULTIVITAMIN) tablet Take 1 tablet by mouth daily.    triamcinolone (NASACORT) 55 MCG/ACT AERO nasal inhaler Place 2 sprays into the nose 2 (two) times daily.    triamcinolone ointment (KENALOG) 0.1 % Apply 1 Application topically 2 (two) times daily.    zinc gluconate 50 MG tablet Take 50 mg by mouth daily.    No facility-administered encounter medications on file as of 08/27/2023.     ONCOLOGIC FAMILY HISTORY:  Family History  Problem Relation Age of Onset   Hypertension Mother    Lung disease Father      GENETIC COUNSELING/TESTING: Not at this time  SOCIAL HISTORY:  Social History   Socioeconomic History   Marital  status: Single    Spouse name: Not on file   Number of children: 0   Years of education: Not on file   Highest education level: Not on file  Occupational History   Occupation: retired    Comment: 2017  Tobacco Use   Smoking status: Never   Smokeless tobacco: Never  Vaping Use   Vaping status: Never Used  Substance and Sexual Activity   Alcohol use: No   Drug use: No   Sexual activity: Not Currently    Comment: not in relationship currently  Other Topics Concern   Not on file  Social History Narrative   Not on file   Social Drivers of Health   Financial Resource Strain: Low Risk  (12/11/2022)   Overall Financial Resource Strain (CARDIA)    Difficulty of Paying Living Expenses: Not hard at all  Food Insecurity: No Food Insecurity (12/11/2022)   Hunger Vital Sign    Worried About Running Out of Food in the Last Year: Never true    Ran Out of Food in the Last Year: Never true  Transportation Needs: No Transportation Needs (12/11/2022)   PRAPARE -  Administrator, Civil Service (Medical): No    Lack of Transportation (Non-Medical): No  Physical Activity: Insufficiently Active (12/11/2022)   Exercise Vital Sign    Days of Exercise per Week: 3 days    Minutes of Exercise per Session: 30 min  Stress: No Stress Concern Present (12/11/2022)   [employer] of Occupational Health - Occupational Stress Questionnaire    Feeling of Stress : Not at all  Social Connections: Moderately Isolated (12/11/2022)   Social Connection and Isolation Panel [NHANES]    Frequency of Communication with Friends and Family: More than three times a week    Frequency of Social Gatherings with Friends and Family: More than three times a week    Attends Religious Services: More than 4 times per year    Active Member of [employer] or Organizations: No    Attends Banker Meetings: Never    Marital Status: Never married  Intimate Partner Violence: Not At Risk (12/11/2022)   Humiliation, Afraid, Rape, and Kick questionnaire    Fear of Current or Ex-Partner: No    Emotionally Abused: No    Physically Abused: No    Sexually Abused: No     OBSERVATIONS/OBJECTIVE:  There were no vitals taken for this visit.  Physical exam deferred, telephone visit LABORATORY DATA:  None for this visit.  DIAGNOSTIC IMAGING:  None for this visit.      ASSESSMENT AND PLAN:    Laurie Mckenzie is a pleasant 73 y.o. female with Stage IA left breast invasive ductal carcinoma, ER+/PR+/HER2-, diagnosed in 02/2021, treated with lumpectomy, adjuvant radiation therapy, and anti-estrogen therapy with Anastrozole beginning in 05/2021.  DEXA scan scheduled.   *Total Encounter Time as defined by the Centers for Medicare and Medicaid Services includes, in addition to the face-to-face time of a patient visit (documented in the note above) non-face-to-face time: obtaining and reviewing outside history, ordering and reviewing medications, tests or procedures, care coordination  (communications with other health care professionals or caregivers) and documentation in the medical record.

## 2023-08-27 ENCOUNTER — Inpatient Hospital Stay: Payer: Medicare Other | Admitting: Hematology and Oncology

## 2023-08-27 ENCOUNTER — Inpatient Hospital Stay: Payer: Medicare Other

## 2023-08-27 ENCOUNTER — Inpatient Hospital Stay: Payer: Medicare Other | Attending: Hematology and Oncology

## 2023-08-27 VITALS — BP 152/82 | HR 79 | Resp 16

## 2023-08-27 VITALS — BP 161/76 | HR 84 | Temp 98.3°F | Resp 18 | Wt 222.1 lb

## 2023-08-27 DIAGNOSIS — M8580 Other specified disorders of bone density and structure, unspecified site: Secondary | ICD-10-CM | POA: Diagnosis not present

## 2023-08-27 DIAGNOSIS — Z1732 Human epidermal growth factor receptor 2 negative status: Secondary | ICD-10-CM | POA: Insufficient documentation

## 2023-08-27 DIAGNOSIS — Z79811 Long term (current) use of aromatase inhibitors: Secondary | ICD-10-CM | POA: Insufficient documentation

## 2023-08-27 DIAGNOSIS — M85859 Other specified disorders of bone density and structure, unspecified thigh: Secondary | ICD-10-CM | POA: Diagnosis not present

## 2023-08-27 DIAGNOSIS — Z170 Estrogen receptor positive status [ER+]: Secondary | ICD-10-CM

## 2023-08-27 DIAGNOSIS — C50412 Malignant neoplasm of upper-outer quadrant of left female breast: Secondary | ICD-10-CM

## 2023-08-27 DIAGNOSIS — Z923 Personal history of irradiation: Secondary | ICD-10-CM | POA: Insufficient documentation

## 2023-08-27 DIAGNOSIS — Z9221 Personal history of antineoplastic chemotherapy: Secondary | ICD-10-CM | POA: Insufficient documentation

## 2023-08-27 LAB — CMP (CANCER CENTER ONLY)
ALT: 43 U/L (ref 0–44)
AST: 30 U/L (ref 15–41)
Albumin: 4.6 g/dL (ref 3.5–5.0)
Alkaline Phosphatase: 78 U/L (ref 38–126)
Anion gap: 10 (ref 5–15)
BUN: 14 mg/dL (ref 8–23)
CO2: 29 mmol/L (ref 22–32)
Calcium: 9.7 mg/dL (ref 8.9–10.3)
Chloride: 102 mmol/L (ref 98–111)
Creatinine: 0.76 mg/dL (ref 0.44–1.00)
GFR, Estimated: >60 mL/min (ref >60)
Glucose, Bld: 170 mg/dL — ABNORMAL HIGH (ref 70–99)
Potassium: 3.9 mmol/L (ref 3.5–5.1)
Sodium: 141 mmol/L (ref 135–145)
Total Bilirubin: 0.4 mg/dL (ref 0.0–1.2)
Total Protein: 7.7 g/dL (ref 6.5–8.1)

## 2023-08-27 LAB — CBC WITH DIFFERENTIAL (CANCER CENTER ONLY)
Abs Immature Granulocytes: 0.06 10*3/uL (ref 0.00–0.07)
Basophils Absolute: 0.1 10*3/uL (ref 0.0–0.1)
Basophils Relative: 1 %
Eosinophils Absolute: 0.1 10*3/uL (ref 0.0–0.5)
Eosinophils Relative: 2 %
HCT: 43.9 % (ref 36.0–46.0)
Hemoglobin: 14.3 g/dL (ref 12.0–15.0)
Immature Granulocytes: 1 %
Lymphocytes Relative: 21 %
Lymphs Abs: 1.6 10*3/uL (ref 0.7–4.0)
MCH: 31.1 pg (ref 26.0–34.0)
MCHC: 32.6 g/dL (ref 30.0–36.0)
MCV: 95.4 fL (ref 80.0–100.0)
Monocytes Absolute: 0.6 10*3/uL (ref 0.1–1.0)
Monocytes Relative: 7 %
Neutro Abs: 5.3 10*3/uL (ref 1.7–7.7)
Neutrophils Relative %: 68 %
Platelet Count: 237 10*3/uL (ref 150–400)
RBC: 4.6 MIL/uL (ref 3.87–5.11)
RDW: 12.6 % (ref 11.5–15.5)
WBC Count: 7.8 10*3/uL (ref 4.0–10.5)
nRBC: 0 % (ref 0.0–0.2)

## 2023-08-27 MED ORDER — SODIUM CHLORIDE 0.9 % IV SOLN
Freq: Once | INTRAVENOUS | Status: AC
Start: 2023-08-27 — End: 2023-08-27

## 2023-08-27 MED ORDER — ZOLEDRONIC ACID 4 MG/100ML IV SOLN
4.0000 mg | Freq: Once | INTRAVENOUS | Status: AC
Start: 2023-08-27 — End: 2023-08-27
  Administered 2023-08-27: 4 mg via INTRAVENOUS
  Filled 2023-08-27: qty 100

## 2023-08-27 NOTE — Patient Instructions (Signed)

## 2023-08-27 NOTE — Progress Notes (Signed)
 Scr 0.76, Ca 9.7 and no dental issues today.

## 2023-09-17 ENCOUNTER — Other Ambulatory Visit: Payer: Self-pay | Admitting: Family Medicine

## 2023-09-24 ENCOUNTER — Telehealth: Payer: Self-pay | Admitting: Family Medicine

## 2023-09-24 NOTE — Telephone Encounter (Signed)
 Noted - appt has been made

## 2023-09-24 NOTE — Telephone Encounter (Signed)
 Copied from CRM [id number]. Topic: General - Other >> Sep 24, 2023  9:23 AM Tiffany S wrote: Reason for CRM: Patient was calling to let Melissa know that she is okay with doing the Bone Density on the same day as her appt with Dr Dettingger.

## 2023-11-09 ENCOUNTER — Ambulatory Visit
Admission: EM | Admit: 2023-11-09 | Discharge: 2023-11-09 | Disposition: A | Discharge status: Home / Self Care | Attending: Family Medicine | Admitting: Family Medicine

## 2023-11-09 DIAGNOSIS — L239 Allergic contact dermatitis, unspecified cause: Secondary | ICD-10-CM

## 2023-11-09 MED ORDER — TRIAMCINOLONE ACETONIDE 0.1 % EX CREA: 1.0000 | TOPICAL_CREAM | Freq: Two times a day (BID) | CUTANEOUS | 0 refills | Status: DC

## 2023-11-09 MED ORDER — PREDNISONE 10 MG PO TABS: ORAL_TABLET | ORAL | 0 refills | Status: DC

## 2023-11-09 NOTE — ED Triage Notes (Signed)
 Pt reports she has a poison oak rash from 2 weeks ago on her arms, neck, and legs.

## 2023-11-09 NOTE — ED Provider Notes (Signed)
 [facility]    CSN: [id number] Arrival date & time: 11/09/23  0807      History   Chief Complaint No chief complaint on file.   HPI Laurie Mckenzie is a 73 y.o. female.   Patient presenting today with itchy rash to bilateral arms now spreading to her chest and abdomen over the past week to week and a half after coming in contact with some poison ivy.  Denies throat itching or swelling, chest tightness, nausea, vomiting, new foods or medications.  Trying over-the-counter remedies with minimal relief.    Past Medical History:  Diagnosis Date   Cancer [facility])    Headache    Hypertension    186/106 today    195/108 07/27/2017   Hypothyroidism    Primary osteoarthritis of knee    Right    Patient Active Problem List   Diagnosis Date Noted   Cough 05/21/2023   Osteopenia 11/27/2022   Hypertriglyceridemia 07/17/2022   Malignant neoplasm of upper-outer quadrant of left breast in female, estrogen receptor positive (HCC) 03/17/2021   Primary localized osteoarthritis of right knee 08/23/2017   Hypothyroidism    Hypertension    Chronic allergic rhinitis 03/18/2015   Obesity (BMI 30-39.9) 02/20/2014   Hyperlipidemia 12/25/2013    Past Surgical History:  Procedure Laterality Date   BREAST LUMPECTOMY WITH RADIOACTIVE SEED AND SENTINEL LYMPH NODE BIOPSY Left 03/27/2021   Procedure: LEFT BREAST LUMPECTOMY WITH RADIOACTIVE SEED AND SENTINEL LYMPH NODE BIOPSY;  Surgeon: Sim Dryer, MD;  Location: [facility];  Service: General;  Laterality: Left;   BREAST LUMPECTOMY WITH RADIOACTIVE SEED LOCALIZATION Left 05/05/2023   Procedure: RADIOACTIVE SEED GUIDED LEFT BREAST LUMPECTOMY;  Surgeon: Sim Dryer, MD;  Location: [facility];  Service: General;  Laterality: Left;   COLONOSCOPY N/A 01/16/2015   Procedure: COLONOSCOPY;  Surgeon: Suzette Espy, MD;  [facility];  Service: Endoscopy;  Laterality: N/A;  8:30 AM   KNEE ARTHROPLASTY  Left 2011   [employer]   TOTAL KNEE ARTHROPLASTY Right 08/23/2017   Procedure: TOTAL KNEE ARTHROPLASTY;  Surgeon: Elly Habermann, MD;  Location: [facility] OR;  Service: Orthopedics;  Laterality: Right;    OB History   No obstetric history on file.      Home Medications    Prior to Admission medications   Medication Sig Start Date End Date Taking? Authorizing Provider  predniSONE  (DELTASONE ) 10 MG tablet Take 6 tabs daily x 2 days, 5 tabs daily x 2 days, 4 tabs daily x 2 days, etc 11/09/23  Yes Corbin Dess, PA-C  triamcinolone  cream (KENALOG ) 0.1 % Apply 1 Application topically 2 (two) times daily. 11/09/23  Yes Corbin Dess, PA-C  acetaminophen  (TYLENOL ) 500 MG tablet Take 1,000-1,500 mg by mouth every 6 (six) hours as needed for moderate pain or headache.     [provider]  amLODipine  (NORVASC ) 5 MG tablet Take 1 tablet (5 mg total) by mouth daily. 08/05/23   Dettinger, Lucio Sabin, MD  anastrozole  (ARIMIDEX ) 1 MG tablet TAKE ONE TABLET ONCE DAILY 06/18/23   Iruku, Praveena, MD  Ascorbic Acid (VITAMIN C) 100 MG tablet Take 100 mg by mouth daily.    [provider]  atorvastatin  (LIPITOR) 20 MG tablet Take 1 tablet (20 mg total) by mouth daily. 08/05/23   Dettinger, Lucio Sabin, MD  calcium  carbonate (OSCAL) 1500 (600 Ca) MG TABS tablet Take 600 mg by mouth 2 (two) times daily with a meal.  [provider]  cetirizine  (ZYRTEC ) 10 MG tablet Take 1 tablet (10 mg total) by mouth daily. 05/21/23   Leath-Warren, Belen Bowers, NP  Cholecalciferol (VITAMIN D3 PO) Take 1 capsule by mouth daily.    [provider]  diphenhydrAMINE  (BENADRYL ) 25 MG tablet Take 25 mg by mouth once.    [provider]  EPINEPHRINE  0.3 mg/0.3 mL IJ SOAJ injection Inject 0.3 mLs (0.3 mgttotal) into the muscle for 1 dose. 03/29/23   Dettinger, Lucio Sabin, MD  fenofibrate  (TRICOR ) 48 MG tablet Take 1 tablet (48 mg total) by mouth daily. 01/18/23   Dettinger, Lucio Sabin, MD   ibuprofen  (ADVIL ) 800 MG tablet Take 1 tablet (800 mg total) by mouth every 8 (eight) hours as needed. 03/27/21   Cornett, Andy Bannister, MD  levothyroxine  (SYNTHROID ) 150 MCG tablet Take 1 tablet (150 mcg total) by mouth daily. 08/05/23   Dettinger, Lucio Sabin, MD  montelukast  (SINGULAIR ) 10 MG tablet TAKE ONE TABLET AT BEDTIME 09/17/23   Dettinger, Joshua A, MD  Multiple Vitamin (MULTIVITAMIN) tablet Take 1 tablet by mouth daily.    [provider]  triamcinolone  (NASACORT ) 55 MCG/ACT AERO nasal inhaler Place 2 sprays into the nose 2 (two) times daily.    [provider]  triamcinolone  ointment (KENALOG ) 0.1 % Apply 1 Application topically 2 (two) times daily. 12/21/22   Wilhemena Harbour, NP  zinc gluconate 50 MG tablet Take 50 mg by mouth daily.    [provider]    Family History Family History  Problem Relation Age of Onset   Hypertension Mother    Lung disease Father     Social History Social History   Tobacco Use   Smoking status: Never   Smokeless tobacco: Never  Vaping Use   Vaping status: Never Used  Substance Use Topics   Alcohol use: No   Drug use: No     Allergies   Sulfa antibiotics, Bee venom, Fish allergy, and Ivp dye [iodinated contrast media]   Review of Systems Review of Systems Per HPI  Physical Exam Triage Vital Signs ED Triage Vitals [11/09/23 0821]  Encounter Vitals Group     BP (!) 149/90     Systolic BP Percentile      Diastolic BP Percentile      Pulse Rate 92     Resp 20     Temp 98.5 F (36.9 C)     Temp Source Oral     SpO2 94 %     Weight      Height      Head Circumference      Peak Flow      Pain Score 0     Pain Loc      Pain Education      Exclude from Growth Chart    No data found.  Updated Vital Signs BP (!) 149/90 (BP Location: Right Arm)   Pulse 92   Temp 98.5 F (36.9 C) (Oral)   Resp 20   SpO2 94%   Visual Acuity Right Eye Distance:   Left Eye Distance:   Bilateral Distance:    Right  Eye Near:   Left Eye Near:    Bilateral Near:     Physical Exam Vitals and nursing note reviewed.  Constitutional:      Appearance: Normal appearance. She is not ill-appearing.  HENT:     Head: Atraumatic.  Eyes:     Extraocular Movements: Extraocular movements intact.     Conjunctiva/sclera:  Conjunctivae normal.  Cardiovascular:     Rate and Rhythm: Normal rate.  Pulmonary:     Effort: Pulmonary effort is normal.  Musculoskeletal:        General: Normal range of motion.     Cervical back: Normal range of motion and neck supple.  Skin:    General: Skin is warm and dry.     Findings: Rash present.     Comments: Erythematous maculopapular rash sporadic across extremities, chest  Neurological:     Mental Status: She is alert and oriented to person, place, and time.  Psychiatric:        Mood and Affect: Mood normal.        Thought Content: Thought content normal.        Judgment: Judgment normal.      UC Treatments / Results  Labs (all labs ordered are listed, but only abnormal results are displayed) Labs Reviewed - No data to display  EKG   Radiology No results found.  Procedures Procedures (including critical care time)  Medications Ordered in UC Medications - No data to display  Initial Impression / Assessment and Plan / UC Course  I have reviewed the triage vital signs and the nursing notes.  Pertinent labs & imaging results that were available during my care of the patient were reviewed by me and considered in my medical decision making (see chart for details).     Vitals and exam reassuring today.  Consistent with poison ivy dermatitis.  Treat with extended prednisone  taper, triamcinolone  cream, supportive home care.  Return for worsening symptoms.  Final Clinical Impressions(s) / UC Diagnoses   Final diagnoses:  Allergic dermatitis   Discharge Instructions   None    ED Prescriptions     Medication Sig Dispense Auth. Provider   predniSONE   (DELTASONE ) 10 MG tablet Take 6 tabs daily x 2 days, 5 tabs daily x 2 days, 4 tabs daily x 2 days, etc 42 tablet Corbin Dess, PA-C   triamcinolone  cream (KENALOG ) 0.1 % Apply 1 Application topically 2 (two) times daily. 80 g Corbin Dess, [place]      PDMP not reviewed this encounter.   Corbin Dess, [place] 11/09/23 1953

## 2023-11-24 NOTE — Progress Notes (Signed)
 [facility] - Clinic Note  12/01/2023     CHIEF COMPLAINT Patient presents for Retina Follow Up    HISTORY OF PRESENT ILLNESS: Laurie Mckenzie is a 73 y.o. female who presents to the clinic today for:   HPI     Retina Follow Up   Patient presents with  Other.  This started 3 years ago.  Duration of 12 months.  I, the attending physician,  performed the HPI with the patient and updated documentation appropriately.        Comments   Pt presents for 12 mo. retina eval, ERM OU. Pt states no changes in vision. Pt denies FOL/floaters/pain. Pt is not using any Ats.       Last edited by Ronelle Coffee, MD on 12/09/2023  3:46 AM.    Pt states vision is stable, no new health problems  Referring physician: Patria Bookbinder, OD [address]  [place], [place] [address]  HISTORICAL INFORMATION:   Selected notes from the MEDICAL RECORD NUMBER Referred by Dr. Nolon Baxter LEE: 06.28.22 Ocular Hx- ERM OU, VMT OS, Cataracts OU. VA OD 20/20, OS 20/30    CURRENT MEDICATIONS: No current outpatient medications on file. (Ophthalmic Drugs)   No current facility-administered medications for this visit. (Ophthalmic Drugs)   Current Outpatient Medications (Other)  Medication Sig   acetaminophen  (TYLENOL ) 500 MG tablet Take 1,000-1,500 mg by mouth every 6 (six) hours as needed for moderate pain or headache.    amLODipine  (NORVASC ) 5 MG tablet Take 1 tablet (5 mg total) by mouth daily.   anastrozole  (ARIMIDEX ) 1 MG tablet TAKE ONE TABLET ONCE DAILY   Ascorbic Acid (VITAMIN C) 100 MG tablet Take 100 mg by mouth daily.   atorvastatin  (LIPITOR) 20 MG tablet Take 1 tablet (20 mg total) by mouth daily.   calcium  carbonate (OSCAL) 1500 (600 Ca) MG TABS tablet Take 600 mg by mouth 2 (two) times daily with a meal.   cetirizine  (ZYRTEC ) 10 MG tablet Take 1 tablet (10 mg total) by mouth daily.   Cholecalciferol (VITAMIN D3 PO) Take 1 capsule by mouth daily.   diphenhydrAMINE  (BENADRYL )  25 MG tablet Take 25 mg by mouth once.   EPINEPHRINE  0.3 mg/0.3 mL IJ SOAJ injection Inject 0.3 mLs (0.3 mgttotal) into the muscle for 1 dose.   fenofibrate  (TRICOR ) 48 MG tablet Take 1 tablet (48 mg total) by mouth daily.   levothyroxine  (SYNTHROID ) 150 MCG tablet Take 1 tablet (150 mcg total) by mouth daily.   montelukast  (SINGULAIR ) 10 MG tablet TAKE ONE TABLET AT BEDTIME   Multiple Vitamin (MULTIVITAMIN) tablet Take 1 tablet by mouth daily.   triamcinolone  (NASACORT ) 55 MCG/ACT AERO nasal inhaler Place 2 sprays into the nose 2 (two) times daily.   triamcinolone  cream (KENALOG ) 0.1 % Apply 1 Application topically 2 (two) times daily.   zinc gluconate 50 MG tablet Take 50 mg by mouth daily.   ibuprofen  (ADVIL ) 800 MG tablet Take 1 tablet (800 mg total) by mouth every 8 (eight) hours as needed. (Patient not taking: Reported on 12/01/2023)   predniSONE  (DELTASONE ) 10 MG tablet Take 6 tabs daily x 2 days, 5 tabs daily x 2 days, 4 tabs daily x 2 days, etc (Patient not taking: Reported on 12/01/2023)   triamcinolone  ointment (KENALOG ) 0.1 % Apply 1 Application topically 2 (two) times daily. (Patient not taking: Reported on 12/01/2023)   No current facility-administered medications for this visit. (Other)   REVIEW OF SYSTEMS:  ROS   Positive for: Musculoskeletal, Endocrine, Cardiovascular, Eyes Negative for: Constitutional, Gastrointestinal, Neurological, Skin, Genitourinary, HENT, Respiratory, Psychiatric, Allergic/Imm, Heme/Lymph Last edited by Carrington Clack, COT on 12/01/2023  7:37 AM.      ALLERGIES Allergies  Allergen Reactions   Sulfa Antibiotics Other (See Comments)   Bee Venom Swelling and Other (See Comments)    Red and hot to site   Fish Allergy Hives   Ivp Dye [Iodinated Contrast Media] Other (See Comments)    MD advised not to use, due to fish allergies   PAST MEDICAL HISTORY Past Medical History:  Diagnosis Date   Cancer [facility])    Headache    Hypertension    186/106 today     195/108 07/27/2017   Hypothyroidism    Primary osteoarthritis of knee    Right   Past Surgical History:  Procedure Laterality Date   BREAST LUMPECTOMY WITH RADIOACTIVE SEED AND SENTINEL LYMPH NODE BIOPSY Left 03/27/2021   Procedure: LEFT BREAST LUMPECTOMY WITH RADIOACTIVE SEED AND SENTINEL LYMPH NODE BIOPSY;  Surgeon: Sim Dryer, MD;  Location: [facility];  Service: General;  Laterality: Left;   BREAST LUMPECTOMY WITH RADIOACTIVE SEED LOCALIZATION Left 05/05/2023   Procedure: RADIOACTIVE SEED GUIDED LEFT BREAST LUMPECTOMY;  Surgeon: Sim Dryer, MD;  Location:  SURGERY CENTER;  Service: General;  Laterality: Left;   COLONOSCOPY N/A 01/16/2015   Procedure: COLONOSCOPY;  Surgeon: Suzette Espy, MD;  [facility];  Service: Endoscopy;  Laterality: N/A;  8:30 AM   KNEE ARTHROPLASTY Left 2011   [place]   TOTAL KNEE ARTHROPLASTY Right 08/23/2017   Procedure: TOTAL KNEE ARTHROPLASTY;  Surgeon: Elly Habermann, MD;  Location: [facility] OR;  Service: Orthopedics;  Laterality: Right;   FAMILY HISTORY Family History  Problem Relation Age of Onset   Hypertension Mother    Lung disease Father    SOCIAL HISTORY Social History   Tobacco Use   Smoking status: Never   Smokeless tobacco: Never  Vaping Use   Vaping status: Never Used  Substance Use Topics   Alcohol use: No   Drug use: No       OPHTHALMIC EXAM: Base Eye Exam     Visual Acuity (Snellen - Linear)       Right Left   Dist cc 20/25 +2 20/20   Dist ph cc 20/20 -2     Correction: Glasses         Tonometry (Tonopen, 7:46 AM)       Right Left   Pressure 17 19         Pupils       Pupils Dark Light Shape React APD   Right PERRL 3 2 Round Brisk None   Left PERRL 3 2 Round Brisk None         Visual Fields       Left Right    Full Full         Extraocular Movement       Right Left    Full, Ortho Full, Ortho         Neuro/Psych     Oriented x3: Yes    Mood/Affect: Normal         Dilation     Both eyes: 1.0% Mydriacyl, 2.5% Phenylephrine  @ 7:46 AM           Slit Lamp and Fundus Exam     Slit Lamp Exam       Right Left  Lids/Lashes Dermatochalasis - upper lid Dermatochalasis - upper lid, mild MGD   Conjunctiva/Sclera White and quiet White and quiet   Cornea trace PEE, trace tear film debris trace PEE, trace tear film debris   Anterior Chamber deep, clear, narrow angles deep, clear, narrow angles   Iris Round and dilated, mild anterior bowing Round and dilated   Lens 2-3+ Nuclear sclerosis with brunescence, 2-3+ Cortical cataract 2-3+ Nuclear sclerosis with brunescence, 2-3+ Cortical cataract   Anterior Vitreous Vitreous syneresis, Posterior vitreous detachment Vitreous syneresis, Posterior vitreous detachment, vitreous condensations         Fundus Exam       Right Left   Disc Pink and Sharp, mild PPA/PPP Pink and Sharp, temporal PPA   C/D Ratio 0.3 0.3   Macula Flat, Blunted foveal reflex, ERM with mild striae, no heme or edema Flat, Blunted foveal reflex, ERM with striae and central thickening -- stable, no heme   Vessels mild attenuation, mild tortuosity mild attenuation, mild tortuosity   Periphery Attached; no heme; no RT/RD Attached; no heme           Refraction     Wearing Rx       Sphere Cylinder Axis Add   Right +2.25 +1.00 153 +2.00   Left +1.75 +0.50 178 +2.00            IMAGING AND PROCEDURES  Imaging and Procedures for 12/01/2023  OCT, Retina - OU - Both Eyes       Right Eye Quality was good. Central Foveal Thickness: 355. Progression has been stable. Findings include no IRF, no SRF, abnormal foveal contour, retinal drusen , epiretinal membrane, macular pucker (persistent mild ERM with pucker -- stable).   Left Eye Quality was good. Central Foveal Thickness: 470. Progression has been stable. Findings include no IRF, no SRF, abnormal foveal contour, epiretinal membrane, macular pucker  (ERM with central thickening and mild pucker -- no significant change from prior).   Notes *Images captured and stored on drive  Diagnosis / Impression:  ERM with pucker OU (OS>OD) OD: persistent mild ERM with pucker -- stable OS: ERM with central thickening and mild pucker -- no significant change from prior  Clinical management:  See below  Abbreviations: NFP - Normal foveal profile. CME - cystoid macular edema. PED - pigment epithelial detachment. IRF - intraretinal fluid. SRF - subretinal fluid. EZ - ellipsoid zone. ERM - epiretinal membrane. ORA - outer retinal atrophy. ORT - outer retinal tubulation. SRHM - subretinal hyper-reflective material. IRHM - intraretinal hyper-reflective material             ASSESSMENT/PLAN:    ICD-10-CM   1. Epiretinal membrane (ERM) of both eyes  H35.373 OCT, Retina - OU - Both Eyes    2. Essential hypertension  I10     3. Hypertensive retinopathy of both eyes  H35.033     4. Combined forms of age-related cataract of both eyes  H25.813       1,2. Epiretinal membrane, both eyes (OS>OD)  - pt reports history of ERM dates back to 2018-2019 -- initially noted by Dr. Micael Adas - ERM w/ pucker OU (OS > OD) -- OS with loss of foveal contour and +central thickening - BCVA OD: 20/20, OS: 20/30 -- stable OU - OCT w/o IRF/SRF and ERMs stable from prior - mild blurring OU, no metamorphopsia - no indication for surgery at this time - discussed annual f/u vs prn f/u -- pt wishes to f/u here annually for [facility] -  f/u 1 year - DFE/OCT  3,4. Hypertensive retinopathy OU - discussed importance of tight BP control - monitor  5. Mixed Cataract OU - The symptoms of cataract, surgical options, and treatments and risks were discussed with patient. - discussed diagnosis and progression - discussed recommendation of having cataract surgery prior to any retina surgery, if possible - monitor  Ophthalmic Meds Ordered this visit:  No orders of the defined types  were placed in this encounter.    Return in about 1 year (around 11/30/2024) for f/u ERM OU, DFE, OCT.  There are no Patient Instructions on file for this visit.   This document serves as a record of services personally performed by Jeanice Millard, MD, PhD. It was created on their behalf by Angelia Kelp, an ophthalmic technician. The creation of this record is the provider's dictation and/or activities during the visit.    Electronically signed by: Angelia Kelp, OA, 12/09/23  3:47 AM  This document serves as a record of services personally performed by Jeanice Millard, MD, PhD. It was created on their behalf by Morley Arabia. Bevin Bucks, OA an ophthalmic technician. The creation of this record is the provider's dictation and/or activities during the visit.    Electronically signed by: Morley Arabia. Bevin Bucks, OA 12/09/23 3:47 AM   Jeanice Millard, M.D., Ph.D. Diseases & Surgery of the Retina and Vitreous [facility]  I have reviewed the above documentation for accuracy and completeness, and I agree with the above. Jeanice Millard, M.D., Ph.D. 12/09/23 3:49 AM   Abbreviations: M myopia (nearsighted); A astigmatism; H hyperopia (farsighted); P presbyopia; Mrx spectacle prescription;  CTL contact lenses; OD right eye; OS left eye; OU both eyes  XT exotropia; ET esotropia; PEK punctate epithelial keratitis; PEE punctate epithelial erosions; DES dry eye syndrome; MGD meibomian gland dysfunction; ATs artificial tears; PFAT's preservative free artificial tears; NSC nuclear sclerotic cataract; PSC posterior subcapsular cataract; ERM epi-retinal membrane; PVD posterior vitreous detachment; RD retinal detachment; DM diabetes mellitus; DR diabetic retinopathy; NPDR non-proliferative diabetic retinopathy; PDR proliferative diabetic retinopathy; CSME clinically significant macular edema; DME diabetic macular edema; dbh dot blot hemorrhages; CWS cotton wool spot; POAG primary open angle  glaucoma; C/D cup-to-disc ratio; HVF humphrey visual field; GVF goldmann visual field; OCT optical coherence tomography; IOP intraocular pressure; BRVO Branch retinal vein occlusion; CRVO central retinal vein occlusion; CRAO central retinal artery occlusion; BRAO branch retinal artery occlusion; RT retinal tear; SB scleral buckle; PPV pars plana vitrectomy; VH Vitreous hemorrhage; PRP panretinal laser photocoagulation; IVK intravitreal kenalog ; VMT vitreomacular traction; MH Macular hole;  NVD neovascularization of the disc; NVE neovascularization elsewhere; AREDS age related eye disease study; ARMD age related macular degeneration; POAG primary open angle glaucoma; EBMD epithelial/anterior basement membrane dystrophy; ACIOL anterior chamber intraocular lens; IOL intraocular lens; PCIOL posterior chamber intraocular lens; Phaco/IOL phacoemulsification with intraocular lens placement; PRK photorefractive keratectomy; LASIK laser assisted in situ keratomileusis; HTN hypertension; DM diabetes mellitus; COPD chronic obstructive pulmonary disease

## 2023-12-01 ENCOUNTER — Encounter (INDEPENDENT_AMBULATORY_CARE_PROVIDER_SITE_OTHER): Payer: Self-pay | Admitting: Ophthalmology

## 2023-12-01 ENCOUNTER — Ambulatory Visit (INDEPENDENT_AMBULATORY_CARE_PROVIDER_SITE_OTHER): Payer: Medicare Other | Admitting: Ophthalmology

## 2023-12-01 DIAGNOSIS — H35373 Puckering of macula, bilateral: Secondary | ICD-10-CM

## 2023-12-01 DIAGNOSIS — H25813 Combined forms of age-related cataract, bilateral: Secondary | ICD-10-CM | POA: Diagnosis not present

## 2023-12-01 DIAGNOSIS — H35033 Hypertensive retinopathy, bilateral: Secondary | ICD-10-CM | POA: Diagnosis not present

## 2023-12-01 DIAGNOSIS — I10 Essential (primary) hypertension: Secondary | ICD-10-CM

## 2023-12-09 ENCOUNTER — Encounter (INDEPENDENT_AMBULATORY_CARE_PROVIDER_SITE_OTHER): Payer: Self-pay | Admitting: Ophthalmology

## 2023-12-13 ENCOUNTER — Ambulatory Visit (INDEPENDENT_AMBULATORY_CARE_PROVIDER_SITE_OTHER): Payer: Medicare Other

## 2023-12-13 VITALS — BP 161/76 | HR 84 | Ht 64.0 in | Wt 222.0 lb

## 2023-12-13 DIAGNOSIS — Z0000 Encounter for general adult medical examination without abnormal findings: Secondary | ICD-10-CM | POA: Diagnosis not present

## 2023-12-13 NOTE — Progress Notes (Signed)
 Subjective:   Laurie Mckenzie is a 73 y.o. who presents for a Medicare Wellness preventive visit.  As a reminder, Annual Wellness Visits don't include a physical exam, and some assessments may be limited, especially if this visit is performed virtually. We may recommend an in-person follow-up visit with your provider if needed.  Visit Complete: Virtual I connected with  Laurie Mckenzie on 12/13/23 by a audio enabled telemedicine application and verified that I am speaking with the correct person using two identifiers.  Patient Location: Home  Provider Location: Home Office  I discussed the limitations of evaluation and management by telemedicine. The patient expressed understanding and agreed to proceed.  Vital Signs: Because this visit was a virtual/telehealth visit, some criteria may be missing or patient reported. Any vitals not documented were not able to be obtained and vitals that have been documented are patient reported.  VideoDeclined- This patient declined Librarian, academic. Therefore the visit was completed with audio only.  Persons Participating in Visit: Patient.  AWV Questionnaire: No: Patient Medicare AWV questionnaire was not completed prior to this visit.  Cardiac Risk Factors include: advanced age (>33men, >53 women);dyslipidemia;hypertension;obesity (BMI >30kg/m2);female gender     Objective:    Today's Vitals   12/13/23 1359  BP: (!) 161/76  Pulse: 84  Weight: 222 lb (100.7 kg)  Height: 5' 4 (1.626 m)   Body mass index is 38.11 kg/m.     12/13/2023    2:06 PM 05/05/2023    9:12 AM 04/29/2023   12:02 PM 12/11/2022    1:50 PM 12/09/2021    3:43 PM 04/23/2021   10:10 AM 03/27/2021   10:13 AM  Advanced Directives  Does Patient Have a Medical Advance Directive? Yes Yes Yes Yes Yes No No  Type of Estate agent of [place];Living will Healthcare Power of [place];Living will Healthcare Power of [place];Living will  Healthcare Power of [place];Living will Healthcare Power of [place];Living will    Does patient want to make changes to medical advance directive?  No - Patient declined No - Patient declined No - Patient declined   No - Patient declined  Copy of Healthcare Power of Attorney in Chart? No - copy requested Yes - validated most recent copy scanned in chart (See row information) Yes - validated most recent copy scanned in chart (See row information) Yes - validated most recent copy scanned in chart (See row information) Yes - validated most recent copy scanned in chart (See row information)    Would patient like information on creating a medical advance directive?      Yes (ED - Information included in AVS) No - Patient declined    Current Medications (verified) Outpatient Encounter Medications as of 12/13/2023  Medication Sig   acetaminophen  (TYLENOL ) 500 MG tablet Take 1,000-1,500 mg by mouth every 6 (six) hours as needed for moderate pain or headache.    amLODipine  (NORVASC ) 5 MG tablet Take 1 tablet (5 mg total) by mouth daily.   anastrozole  (ARIMIDEX ) 1 MG tablet TAKE ONE TABLET ONCE DAILY   Ascorbic Acid (VITAMIN C) 100 MG tablet Take 100 mg by mouth daily.   atorvastatin  (LIPITOR) 20 MG tablet Take 1 tablet (20 mg total) by mouth daily.   calcium  carbonate (OSCAL) 1500 (600 Ca) MG TABS tablet Take 600 mg by mouth 2 (two) times daily with a meal.   cetirizine  (ZYRTEC ) 10 MG tablet Take 1 tablet (10 mg total) by mouth daily.   Cholecalciferol (  VITAMIN D3 PO) Take 1 capsule by mouth daily.   diphenhydrAMINE  (BENADRYL ) 25 MG tablet Take 25 mg by mouth once.   EPINEPHRINE  0.3 mg/0.3 mL IJ SOAJ injection Inject 0.3 mLs (0.3 mgttotal) into the muscle for 1 dose.   fenofibrate  (TRICOR ) 48 MG tablet Take 1 tablet (48 mg total) by mouth daily.   levothyroxine  (SYNTHROID ) 150 MCG tablet Take 1 tablet (150 mcg total) by mouth daily.   montelukast  (SINGULAIR ) 10 MG tablet TAKE ONE TABLET AT BEDTIME    Multiple Vitamin (MULTIVITAMIN) tablet Take 1 tablet by mouth daily.   triamcinolone  (NASACORT ) 55 MCG/ACT AERO nasal inhaler Place 2 sprays into the nose 2 (two) times daily.   triamcinolone  cream (KENALOG ) 0.1 % Apply 1 Application topically 2 (two) times daily.   zinc gluconate 50 MG tablet Take 50 mg by mouth daily.   ibuprofen  (ADVIL ) 800 MG tablet Take 1 tablet (800 mg total) by mouth every 8 (eight) hours as needed. (Patient not taking: Reported on 12/13/2023)   predniSONE  (DELTASONE ) 10 MG tablet Take 6 tabs daily x 2 days, 5 tabs daily x 2 days, 4 tabs daily x 2 days, etc (Patient not taking: Reported on 12/13/2023)   triamcinolone  ointment (KENALOG ) 0.1 % Apply 1 Application topically 2 (two) times daily. (Patient not taking: Reported on 12/13/2023)   No facility-administered encounter medications on file as of 12/13/2023.    Allergies (verified) Sulfa antibiotics, Bee venom, Fish allergy, and Ivp dye [iodinated contrast media]   History: Past Medical History:  Diagnosis Date   Cancer (HCC)    Headache    Hypertension    186/106 today    195/108 07/27/2017   Hypothyroidism    Primary osteoarthritis of knee    Right   Past Surgical History:  Procedure Laterality Date   BREAST LUMPECTOMY WITH RADIOACTIVE SEED AND SENTINEL LYMPH NODE BIOPSY Left 03/27/2021   Procedure: LEFT BREAST LUMPECTOMY WITH RADIOACTIVE SEED AND SENTINEL LYMPH NODE BIOPSY;  Surgeon: Sim Dryer, MD;  Location: [facility];  Service: General;  Laterality: Left;   BREAST LUMPECTOMY WITH RADIOACTIVE SEED LOCALIZATION Left 05/05/2023   Procedure: RADIOACTIVE SEED GUIDED LEFT BREAST LUMPECTOMY;  Surgeon: Sim Dryer, MD;  Location: [facility];  Service: General;  Laterality: Left;   COLONOSCOPY N/A 01/16/2015   Procedure: COLONOSCOPY;  Surgeon: Suzette Espy, MD;  [facility];  Service: Endoscopy;  Laterality: N/A;  8:30 AM   KNEE ARTHROPLASTY Left 2011   [facility]   TOTAL KNEE ARTHROPLASTY Right 08/23/2017   Procedure: TOTAL KNEE ARTHROPLASTY;  Surgeon: Elly Habermann, MD;  [facility]: [facility];  Service: Orthopedics;  Laterality: Right;   Family History  Problem Relation Age of Onset   Hypertension Mother    Lung disease Father    Social History   Socioeconomic History   Marital status: Single    Spouse name: Not on file   Number of children: 0   Years of education: Not on file   Highest education level: Not on file  Occupational History   Occupation: retired    Comment: 2017  Tobacco Use   Smoking status: Never   Smokeless tobacco: Never  Vaping Use   Vaping status: Never Used  Substance and Sexual Activity   Alcohol use: No   Drug use: No   Sexual activity: Not Currently    Comment: not in relationship currently  Other Topics Concern   Not on file  Social History Narrative  Not on file   Social Drivers of Health   Financial Resource Strain: Low Risk  (12/13/2023)   Overall Financial Resource Strain (CARDIA)    Difficulty of Paying Living Expenses: Not hard at all  Food Insecurity: No Food Insecurity (12/13/2023)   Hunger Vital Sign    Worried About Running Out of Food in the Last Year: Never true    Ran Out of Food in the Last Year: Never true  Transportation Needs: No Transportation Needs (12/13/2023)   PRAPARE - Administrator, Civil Service (Medical): No    Lack of Transportation (Non-Medical): No  Physical Activity: Insufficiently Active (12/13/2023)   Exercise Vital Sign    Days of Exercise per Week: 1 day    Minutes of Exercise per Session: 20 min  Stress: No Stress Concern Present (12/13/2023)   [employer] of Occupational Health - Occupational Stress Questionnaire    Feeling of Stress: Not at all  Social Connections: Moderately Isolated (12/13/2023)   Social Connection and Isolation Panel    Frequency of Communication with Friends and Family: More than three times a week    Frequency of Social  Gatherings with Friends and Family: More than three times a week    Attends Religious Services: More than 4 times per year    Active Member of [employer] or Organizations: No    Attends Engineer, structural: Never    Marital Status: Never married    Tobacco Counseling Counseling given: Yes    Clinical Intake:  Pre-visit preparation completed: Yes  Pain : No/denies pain     BMI - recorded: 38.11 Nutritional Status: BMI > 30  Obese Nutritional Risks: None Diabetes: No  Lab Results  Component Value Date   HGBA1C 5.9 07/17/2019   HGBA1C 6.0 (H) 01/17/2019     How often do you need to have someone help you when you read instructions, pamphlets, or other written materials from your doctor or pharmacy?: 1 - Never  Interpreter Needed?: No  Information entered by :: Alia t/Cma   Activities of Daily Living     12/13/2023    2:03 PM 05/05/2023    8:58 AM  In your present state of health, do you have any difficulty performing the following activities:  Hearing? 0 0  Vision? 0 0  Difficulty concentrating or making decisions? 0 0  Walking or climbing stairs? 0   Dressing or bathing? 0   Doing errands, shopping? 0   Preparing Food and eating ? N   Using the Toilet? N   In the past six months, have you accidently leaked urine? N   Do you have problems with loss of bowel control? N   Managing your Medications? N   Managing your Finances? N   Housekeeping or managing your Housekeeping? N     Patient Care Team: Dettinger, Lucio Sabin, MD as PCP - General (Family Medicine) Sim Dryer, MD as Consulting Physician (General Surgery) Johna Myers, MD as Consulting Physician (Radiation Oncology) Ronelle Coffee, MD as Consulting Physician (Ophthalmology) Murleen Arms, MD as Consulting Physician (Hematology and Oncology) Dr [facility], OD (Optometry)  I have updated your Care Teams any recent Medical Services you may have received from other providers  in the past year.     Assessment:   This is a routine wellness examination for Laurie Mckenzie.  Hearing/Vision screen Hearing Screening - Comments:: Pt denies hearing dif Vision Screening - Comments:: Pt wear denies vision even w/glasses/pt goes to Dr.  Davis in [place]/has an upcoming appt this fall around Sept/Aug.'25   Goals Addressed             This Visit's Progress    Patient Stated       Pt would like to loose 20lbs        Depression Screen     12/13/2023    2:10 PM 08/05/2023    8:14 AM 02/01/2023    8:53 AM 12/11/2022    1:49 PM 07/17/2022    8:23 AM 01/14/2022    8:07 AM 12/09/2021    3:42 PM  PHQ 2/9 Scores  PHQ - 2 Score 0 0 0 0 0 0 0  PHQ- 9 Score 0    0 0     Fall Risk     12/13/2023    2:02 PM 08/05/2023    8:14 AM 08/05/2023    8:13 AM 02/01/2023    8:53 AM 12/11/2022    1:47 PM  Fall Risk   Falls in the past year? 0  0 0 0  Number falls in past yr: 0 0   0  Injury with Fall? 0 0   0  Risk for fall due to : No Fall Risks No Fall Risks   No Fall Risks  Follow up Falls evaluation completed Falls evaluation completed   Falls prevention discussed    MEDICARE RISK AT HOME:  Medicare Risk at Home Any stairs in or around the home?: Yes If so, are there any without handrails?: Yes Home free of loose throw rugs in walkways, pet beds, electrical cords, etc?: Yes Adequate lighting in your home to reduce risk of falls?: Yes Life alert?: No Use of a cane, walker or w/c?: No Grab bars in the bathroom?: No Shower chair or bench in shower?: No Elevated toilet seat or a handicapped toilet?: Yes  TIMED UP AND GO:  Was the test performed?  no  Cognitive Function: 6CIT completed        12/13/2023    2:10 PM 12/11/2022    1:51 PM 12/09/2021    3:45 PM 12/04/2020    9:22 AM  6CIT Screen  What Year? 0 points 0 points 0 points 0 points  What month? 0 points 0 points 0 points 0 points  What time? 0 points 0 points 0 points 0 points  Count back from 20 0 points 0 points 0  points 0 points  Months in reverse 0 points 0 points 0 points 0 points  Repeat phrase 0 points 0 points 0 points 0 points  Total Score 0 points 0 points 0 points 0 points    Immunizations Immunization History  Administered Date(s) Administered   Fluad Quad(high Dose 65+) 03/21/2019, 03/26/2020, 04/08/2021, 04/09/2022   Fluad Trivalent(High Dose 65+) 04/15/2023   Influenza Split 03/29/2013   Influenza, High Dose Seasonal PF 04/15/2017, 04/18/2018   Influenza,inj,Quad PF,6+ Mos 04/14/2016   Influenza,trivalent, recombinat, inj, PF 03/29/2013, 04/26/2014   Influenza-Unspecified 04/26/2014, 04/15/2015, 04/15/2017, 04/18/2018   Moderna Sars-Covid-2 Vaccination 08/10/2019, 09/08/2019, 04/23/2020   Pneumococcal Conjugate-13 10/13/2016   Pneumococcal Polysaccharide-23 02/20/2012, 10/14/2017   Tdap 02/19/2010, 07/19/2020   Zoster Recombinant(Shingrix) 04/24/2017, 08/14/2017   Zoster, Live 02/20/2012    Screening Tests Health Maintenance  Topic Date Due   DEXA SCAN  06/28/2023   COVID-19 Vaccine (4 - 2024-25 season) 12/28/2024 (Originally 02/28/2023)   INFLUENZA VACCINE  01/28/2024   MAMMOGRAM  04/12/2024   Medicare Annual Wellness (AWV)  12/12/2024   Colonoscopy  01/15/2025  DTaP/Tdap/Td (3 - Td or Tdap) 07/19/2030   Pneumococcal Vaccine: 50+ Years  Completed   Hepatitis C Screening  Completed   Zoster Vaccines- Shingrix  Completed   HPV VACCINES  Aged Out   Meningococcal B Vaccine  Aged Out    Health Maintenance  Health Maintenance Due  Topic Date Due   DEXA SCAN  06/28/2023   Health Maintenance Items Addressed:   Additional Screening:  Vision Screening: Recommended annual ophthalmology exams for early detection of glaucoma and other disorders of the eye. Would you like a referral to an eye doctor? No    Dental Screening: Recommended annual dental exams for proper oral hygiene  Community Resource Referral / Chronic Care Management: CRR required this visit?  No   CCM  required this visit?  No   Plan:    I have personally reviewed and noted the following in the patient's chart:   Medical and social history Use of alcohol, tobacco or illicit drugs  Current medications and supplements including opioid prescriptions. Patient is not currently taking opioid prescriptions. Functional ability and status Nutritional status Physical activity Advanced directives List of other physicians Hospitalizations, surgeries, and ER visits in previous 12 months Vitals Screenings to include cognitive, depression, and falls Referrals and appointments  In addition, I have reviewed and discussed with patient certain preventive protocols, quality metrics, and best practice recommendations. A written personalized care plan for preventive services as well as general preventive health recommendations were provided to patient.   Michaelle Adolphus, CMA   12/13/2023   After Visit Summary: (MyChart) Due to this being a telephonic visit, the after visit summary with patients personalized plan was offered to patient via MyChart   Notes: Nothing significant to report at this time.

## 2023-12-13 NOTE — Patient Instructions (Signed)
 Laurie Mckenzie , Thank you for taking time out of your busy schedule to complete your Annual Wellness Visit with me. I enjoyed our conversation and look forward to speaking with you again next year. I, as well as your care team,  appreciate your ongoing commitment to your health goals. Please review the following plan we discussed and let me know if I can assist you in the future. Your Game plan/ To Do List    Follow up Visits: Next Medicare AWV with our clinical staff: 12/13/24 at 11:20a.m.   Next Office Visit with your provider: 02/01/24 at 8:30a.m.  Clinician Recommendations:  Aim for 30 minutes of exercise or brisk walking, 6-8 glasses of water , and 5 servings of fruits and vegetables each day.       This is a list of the screening recommended for you and due dates:  Health Maintenance  Topic Date Due   DEXA scan (bone density measurement)  06/28/2023   COVID-19 Vaccine (4 - 2024-25 season) 12/28/2024*   Flu Shot  01/28/2024   Mammogram  04/12/2024   Medicare Annual Wellness Visit  12/12/2024   Colon Cancer Screening  01/15/2025   DTaP/Tdap/Td vaccine (3 - Td or Tdap) 07/19/2030   Pneumococcal Vaccine for age over 14  Completed   Hepatitis C Screening  Completed   Zoster (Shingles) Vaccine  Completed   HPV Vaccine  Aged Out   Meningitis B Vaccine  Aged Out  *Topic was postponed. The date shown is not the original due date.    Advanced directives: (In Chart) A copy of your advanced directives are scanned into your chart should your provider ever need it. Advance Care Planning is important because it:  [x]  Makes sure you receive the medical care that is consistent with your values, goals, and preferences  [x]  It provides guidance to your family and loved ones and reduces their decisional burden about whether or not they are making the right decisions based on your wishes.  Follow the link provided in your after visit summary or read over the paperwork we have mailed to you to help you  started getting your Advance Directives in place. If you need assistance in completing these, please reach out to us  so that we can help you!  See attachments for Preventive Care and Fall Prevention Tips.

## 2024-01-07 ENCOUNTER — Other Ambulatory Visit: Payer: Self-pay | Admitting: Family Medicine

## 2024-01-10 ENCOUNTER — Ambulatory Visit: Payer: Medicare Other | Attending: Surgery

## 2024-01-10 VITALS — Wt 222.5 lb

## 2024-01-10 DIAGNOSIS — Z483 Aftercare following surgery for neoplasm: Secondary | ICD-10-CM | POA: Insufficient documentation

## 2024-01-10 NOTE — Therapy (Signed)
 OUTPATIENT PHYSICAL THERAPY SOZO SCREENING NOTE   Patient Name: Laurie Mckenzie MRN: [id number] DOB:[birth year], 73 y.o., female Today's Date: 01/10/2024  PCP: Dettinger, Fonda LABOR, MD REFERRING PROVIDER: Vanderbilt Ned, MD   PT End of Session - 01/10/24 0809     Visit Number 2   # uncanaged due to screen only   PT Start Time 0807    PT Stop Time 0811    PT Time Calculation (min) 4 min    Activity Tolerance Patient tolerated treatment well    Behavior During Therapy [facility] for tasks assessed/performed          Past Medical History:  Diagnosis Date   Cancer (HCC)    Headache    Hypertension    186/106 today    195/108 07/27/2017   Hypothyroidism    Primary osteoarthritis of knee    Right   Past Surgical History:  Procedure Laterality Date   BREAST LUMPECTOMY WITH RADIOACTIVE SEED AND SENTINEL LYMPH NODE BIOPSY Left 03/27/2021   Procedure: LEFT BREAST LUMPECTOMY WITH RADIOACTIVE SEED AND SENTINEL LYMPH NODE BIOPSY;  Surgeon: Vanderbilt Ned, MD;  Location: [facility];  Service: General;  Laterality: Left;   BREAST LUMPECTOMY WITH RADIOACTIVE SEED LOCALIZATION Left 05/05/2023   Procedure: RADIOACTIVE SEED GUIDED LEFT BREAST LUMPECTOMY;  Surgeon: Vanderbilt Ned, MD;  Location: [facility];  Service: General;  Laterality: Left;   COLONOSCOPY N/A 01/16/2015   Procedure: COLONOSCOPY;  Surgeon: Lamar CHRISTELLA Hollingshead, MD;  [facility];  Service: Endoscopy;  Laterality: N/A;  8:30 AM   KNEE ARTHROPLASTY Left 2011   [place]   TOTAL KNEE ARTHROPLASTY Right 08/23/2017   Procedure: TOTAL KNEE ARTHROPLASTY;  Surgeon: Jane Lamar, MD;  Location: [facility];  Service: Orthopedics;  Laterality: Right;   Patient Active Problem List   Diagnosis Date Noted   Cough 05/21/2023   Osteopenia 11/27/2022   Hypertriglyceridemia 07/17/2022   Malignant neoplasm of upper-outer quadrant of left breast in female, estrogen receptor positive (HCC) 03/17/2021   Primary  localized osteoarthritis of right knee 08/23/2017   Hypothyroidism    Hypertension    Chronic allergic rhinitis 03/18/2015   Obesity (BMI 30-39.9) 02/20/2014   Hyperlipidemia 12/25/2013    REFERRING DIAG: left breast cancer at risk for lymphedema  THERAPY DIAG: Aftercare following surgery for neoplasm  PERTINENT HISTORY: Patient was diagnosed on 02/13/2021 with 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 She underwent a left lumpectomy and sentinel node biopsy on 03/27/2021 with 2 negative nodes removed. It is ER/PR positive and HER2 negative with a Ki67 of 15%  PRECAUTIONS: left UE Lymphedema risk, None  SUBJECTIVE: Pt returns for her 6 month L-Dex screen.   PAIN:  Are you having pain? No  SOZO SCREENING: Patient was assessed today using the SOZO machine to determine the lymphedema index score. This was compared to her baseline score. It was determined that she is within the recommended range when compared to her baseline and no further action is needed at this time. She will continue SOZO screenings. These are done every 3 months for 2 years post operatively followed by every 6 months for 2 years, and then annually.   L-DEX FLOWSHEETS - 01/10/24 0800       L-DEX LYMPHEDEMA SCREENING   Measurement Type Unilateral    L-DEX MEASUREMENT EXTREMITY Upper Extremity    POSITION  Standing    DOMINANT SIDE Right    At Risk Side Left    BASELINE SCORE (UNILATERAL)  2.5    L-DEX SCORE (UNILATERAL) 0.6    VALUE CHANGE (UNILAT) -1.9         P: Cont 6 month L-Dex screens next.    Aden Berwyn Caldron, PTA 01/10/2024, 8:10 AM

## 2024-02-01 ENCOUNTER — Other Ambulatory Visit: Payer: Medicare Other

## 2024-02-01 DIAGNOSIS — E782 Mixed hyperlipidemia: Secondary | ICD-10-CM | POA: Diagnosis not present

## 2024-02-01 DIAGNOSIS — E781 Pure hyperglyceridemia: Secondary | ICD-10-CM | POA: Diagnosis not present

## 2024-02-01 DIAGNOSIS — I10 Essential (primary) hypertension: Secondary | ICD-10-CM | POA: Diagnosis not present

## 2024-02-01 DIAGNOSIS — E039 Hypothyroidism, unspecified: Secondary | ICD-10-CM

## 2024-02-01 LAB — LIPID PANEL

## 2024-02-02 LAB — CBC WITH DIFFERENTIAL/PLATELET
Basophils Absolute: 0.1 x10E3/uL (ref 0.0–0.2)
Basos: 1 %
EOS (ABSOLUTE): 0.1 x10E3/uL (ref 0.0–0.4)
Eos: 2 %
Hematocrit: 44.5 % (ref 34.0–46.6)
Hemoglobin: 14.6 g/dL (ref 11.1–15.9)
Immature Grans (Abs): 0.1 x10E3/uL (ref 0.0–0.1)
Immature Granulocytes: 1 %
Lymphocytes Absolute: 1.7 x10E3/uL (ref 0.7–3.1)
Lymphs: 24 %
MCH: 30.8 pg (ref 26.6–33.0)
MCHC: 32.8 g/dL (ref 31.5–35.7)
MCV: 94 fL (ref 79–97)
Monocytes Absolute: 0.7 x10E3/uL (ref 0.1–0.9)
Monocytes: 10 %
Neutrophils Absolute: 4.5 x10E3/uL (ref 1.4–7.0)
Neutrophils: 62 %
Platelets: 256 x10E3/uL (ref 150–450)
RBC: 4.74 x10E6/uL (ref 3.77–5.28)
RDW: 12.4 % (ref 11.7–15.4)
WBC: 7.2 x10E3/uL (ref 3.4–10.8)

## 2024-02-02 LAB — CMP14+EGFR
ALT: 44 IU/L — AB (ref 0–32)
AST: 40 IU/L (ref 0–40)
Albumin: 4.5 g/dL (ref 3.8–4.8)
Alkaline Phosphatase: 80 IU/L (ref 44–121)
BUN/Creatinine Ratio: 17 (ref 12–28)
BUN: 14 mg/dL (ref 8–27)
Bilirubin Total: 0.3 mg/dL (ref 0.0–1.2)
CO2: 21 mmol/L (ref 20–29)
Calcium: 10 mg/dL (ref 8.7–10.3)
Chloride: 99 mmol/L (ref 96–106)
Creatinine, Ser: 0.82 mg/dL (ref 0.57–1.00)
Globulin, Total: 2.2 g/dL (ref 1.5–4.5)
Glucose: 122 mg/dL — ABNORMAL HIGH (ref 70–99)
Potassium: 4.7 mmol/L (ref 3.5–5.2)
Sodium: 138 mmol/L (ref 134–144)
Total Protein: 6.7 g/dL (ref 6.0–8.5)
eGFR: 76 mL/min/1.73 (ref >59)

## 2024-02-02 LAB — LIPID PANEL
Cholesterol, Total: 127 mg/dL (ref 100–199)
HDL: 34 mg/dL — AB (ref >39)
LDL CALC COMMENT:: 3.7 ratio (ref 0.0–4.4)
LDL Chol Calc (NIH): 62 mg/dL (ref 0–99)
Triglycerides: 185 mg/dL — AB (ref 0–149)
VLDL Cholesterol Cal: 31 mg/dL (ref 5–40)

## 2024-02-02 LAB — TSH: TSH: 1.95 u[IU]/mL (ref 0.450–4.500)

## 2024-02-03 ENCOUNTER — Encounter: Payer: Self-pay | Admitting: Family Medicine

## 2024-02-03 ENCOUNTER — Ambulatory Visit: Payer: Medicare Other

## 2024-02-03 ENCOUNTER — Ambulatory Visit: Payer: Medicare Other | Admitting: Family Medicine

## 2024-02-03 VITALS — BP 140/76 | HR 85 | Ht 64.0 in | Wt 222.0 lb

## 2024-02-03 DIAGNOSIS — E782 Mixed hyperlipidemia: Secondary | ICD-10-CM

## 2024-02-03 DIAGNOSIS — E1169 Type 2 diabetes mellitus with other specified complication: Secondary | ICD-10-CM

## 2024-02-03 DIAGNOSIS — I10 Essential (primary) hypertension: Secondary | ICD-10-CM

## 2024-02-03 DIAGNOSIS — E781 Pure hyperglyceridemia: Secondary | ICD-10-CM | POA: Diagnosis not present

## 2024-02-03 DIAGNOSIS — E039 Hypothyroidism, unspecified: Secondary | ICD-10-CM

## 2024-02-03 NOTE — Progress Notes (Signed)
 BP (!) 140/76   Pulse 85   Ht 5' 4 (1.626 m)   Wt 222 lb (100.7 kg)   SpO2 97%   BMI 38.11 kg/m    Subjective:   Patient ID: Laurie Mckenzie, female    DOB: August 26, 1950, 73 y.o.   MRN: [id number]  HPI: Laurie Mckenzie is a 73 y.o. female presenting on 02/03/2024 for Medical Management of Chronic Issues, Hypertension, Hyperlipidemia, and Hypothyroidism   Discussed the use of AI scribe software for clinical note transcription with the patient, who gave verbal consent to proceed.  History of Present Illness   Laurie Mckenzie is a 73 year old female with hypertension and thyroid  disease who presents for a recheck of her conditions.  Her blood pressure at home typically ranges from 139/68 to 140/68. She continues to take amlodipine  and confirms taking it this morning.  She is also here for a recheck of her thyroid  disease and reports no issues with her current medication, levothyroxine .  She continues to take fenofibrate  and Lipitor for cholesterol management without any issues.  Her recent blood sugar level was 6.7. She is attempting to reduce her intake of bread and sugary drinks, opting for water  and Gatorade instead. She drinks juice in the morning but is trying to limit the amount.  She notes some swelling in her ankles, more pronounced in one than the other. She elevates her legs while watching TV.  She is scheduled for a bone density test today.          Relevant past medical, surgical, family and social history reviewed and updated as indicated. Interim medical history since our last visit reviewed. Allergies and medications reviewed and updated.  Review of Systems  Constitutional:  Negative for chills and fever.  HENT:  Negative for congestion, ear discharge and ear pain.   Eyes:  Negative for redness and visual disturbance.  Respiratory:  Negative for chest tightness and shortness of breath.   Cardiovascular:  Positive for leg swelling. Negative for chest pain.   Genitourinary:  Negative for difficulty urinating and dysuria.  Musculoskeletal:  Negative for back pain and gait problem.  Skin:  Negative for rash.  Neurological:  Negative for light-headedness and headaches.  Psychiatric/Behavioral:  Negative for agitation and behavioral problems.   All other systems reviewed and are negative.   Per HPI unless specifically indicated above   Allergies as of 02/03/2024       Reactions   Sulfa Antibiotics Other (See Comments)   Bee Venom Swelling, Other (See Comments)   Red and hot to site   Fish Allergy Hives   Ivp Dye [iodinated Contrast Media] Other (See Comments)   MD advised not to use, due to fish allergies        Medication List        Accurate as of February 03, 2024  8:21 AM. If you have any questions, ask your nurse or doctor.          acetaminophen  500 MG tablet Commonly known as: TYLENOL  Take 1,000-1,500 mg by mouth every 6 (six) hours as needed for moderate pain or headache.   amLODipine  5 MG tablet Commonly known as: NORVASC  Take 1 tablet (5 mg total) by mouth daily.   anastrozole  1 MG tablet Commonly known as: ARIMIDEX  TAKE ONE TABLET ONCE DAILY   atorvastatin  20 MG tablet Commonly known as: LIPITOR Take 1 tablet (20 mg total) by mouth daily.   calcium  carbonate 1500 (600 Ca) MG Tabs  tablet Commonly known as: OSCAL Take 600 mg by mouth 2 (two) times daily with a meal.   cetirizine  10 MG tablet Commonly known as: ZYRTEC  Take 1 tablet (10 mg total) by mouth daily.   diphenhydrAMINE  25 MG tablet Commonly known as: BENADRYL  Take 25 mg by mouth once.   EPINEPHrine  0.3 mg/0.3 mL Soaj injection Commonly known as: EPI-PEN Inject 0.3 mLs (0.3 mgttotal) into the muscle for 1 dose.   fenofibrate  48 MG tablet Commonly known as: TRICOR  TAKE ONE TABLET DAILY   ibuprofen  800 MG tablet Commonly known as: ADVIL  Take 1 tablet (800 mg total) by mouth every 8 (eight) hours as needed.   levothyroxine  150 MCG  tablet Commonly known as: SYNTHROID  Take 1 tablet (150 mcg total) by mouth daily.   montelukast  10 MG tablet Commonly known as: SINGULAIR  TAKE ONE TABLET AT BEDTIME   multivitamin tablet Take 1 tablet by mouth daily.   predniSONE  10 MG tablet Commonly known as: DELTASONE  Take 6 tabs daily x 2 days, 5 tabs daily x 2 days, 4 tabs daily x 2 days, etc   triamcinolone  55 MCG/ACT Aero nasal inhaler Commonly known as: NASACORT  Place 2 sprays into the nose 2 (two) times daily.   triamcinolone  ointment 0.1 % Commonly known as: KENALOG  Apply 1 Application topically 2 (two) times daily.   triamcinolone  cream 0.1 % Commonly known as: KENALOG  Apply 1 Application topically 2 (two) times daily.   vitamin C 100 MG tablet Take 100 mg by mouth daily.   VITAMIN D3 PO Take 1 capsule by mouth daily.   zinc gluconate 50 MG tablet Take 50 mg by mouth daily.         Objective:   BP (!) 140/76   Pulse 85   Ht 5' 4 (1.626 m)   Wt 222 lb (100.7 kg)   SpO2 97%   BMI 38.11 kg/m   Wt Readings from Last 3 Encounters:  02/03/24 222 lb (100.7 kg)  01/10/24 222 lb 8 oz (100.9 kg)  12/13/23 222 lb (100.7 kg)    Physical Exam Physical Exam   VITALS: BP- 140/76 NECK: Thyroid  normal on palpation. CHEST: Lungs clear to auscultation bilaterally. CARDIOVASCULAR: Heart regular rate and rhythm, no murmurs. EXTREMITIES: Mild swelling in right ankle. Circulation intact in extremities.       Results for orders placed or performed in visit on 02/01/24  CBC with Differential/Platelet   Collection Time: 02/01/24  8:39 AM  Result Value Ref Range   WBC 7.2 3.4 - 10.8 x10E3/uL   RBC 4.74 3.77 - 5.28 x10E6/uL   Hemoglobin 14.6 11.1 - 15.9 g/dL   Hematocrit 55.4 65.9 - 46.6 %   MCV 94 79 - 97 fL   MCH 30.8 26.6 - 33.0 pg   MCHC 32.8 31.5 - 35.7 g/dL   RDW 87.5 88.2 - 84.5 %   Platelets 256 150 - 450 x10E3/uL   Neutrophils 62 Not Estab. %   Lymphs 24 Not Estab. %   Monocytes 10 Not Estab. %    Eos 2 Not Estab. %   Basos 1 Not Estab. %   Neutrophils Absolute 4.5 1.4 - 7.0 x10E3/uL   Lymphocytes Absolute 1.7 0.7 - 3.1 x10E3/uL   Monocytes Absolute 0.7 0.1 - 0.9 x10E3/uL   EOS (ABSOLUTE) 0.1 0.0 - 0.4 x10E3/uL   Basophils Absolute 0.1 0.0 - 0.2 x10E3/uL   Immature Granulocytes 1 Not Estab. %   Immature Grans (Abs) 0.1 0.0 - 0.1 x10E3/uL  CMP14+EGFR  Collection Time: 02/01/24  8:39 AM  Result Value Ref Range   Glucose 122 (H) 70 - 99 mg/dL   BUN 14 8 - 27 mg/dL   Creatinine, Ser 9.17 0.57 - 1.00 mg/dL   eGFR 76 >40 fO/fpw/8.26   BUN/Creatinine Ratio 17 12 - 28   Sodium 138 134 - 144 mmol/L   Potassium 4.7 3.5 - 5.2 mmol/L   Chloride 99 96 - 106 mmol/L   CO2 21 20 - 29 mmol/L   Calcium  10.0 8.7 - 10.3 mg/dL   Total Protein 6.7 6.0 - 8.5 g/dL   Albumin 4.5 3.8 - 4.8 g/dL   Globulin, Total 2.2 1.5 - 4.5 g/dL   Bilirubin Total 0.3 0.0 - 1.2 mg/dL   Alkaline Phosphatase 80 44 - 121 IU/L   AST 40 0 - 40 IU/L   ALT 44 (H) 0 - 32 IU/L  Lipid panel   Collection Time: 02/01/24  8:39 AM  Result Value Ref Range   Cholesterol, Total 127 100 - 199 mg/dL   Triglycerides 814 (H) 0 - 149 mg/dL   HDL 34 (L) >60 mg/dL   VLDL Cholesterol Cal 31 5 - 40 mg/dL   LDL Chol Calc (NIH) 62 0 - 99 mg/dL   Chol/HDL Ratio 3.7 0.0 - 4.4 ratio  TSH   Collection Time: 02/01/24  8:39 AM  Result Value Ref Range   TSH 1.950 0.450 - 4.500 uIU/mL    Assessment & Plan:   Problem List Items Addressed This Visit       Cardiovascular and Mediastinum   Hypertension     Endocrine   Hypothyroidism - Primary   Type 2 diabetes mellitus with other specified complication (HCC)     Other   Hyperlipidemia   Hypertriglyceridemia       Hypertension Blood pressure slightly elevated at 140/76, managed with amlodipine . - Continue amlodipine . - Monitor blood pressure at home.  Type 2 diabetes A1c elevated at 6.7, dietary changes attempted. - Continue dietary modifications to reduce A1c. -  Encourage reduction of sugary drinks and bread.  Hyperlipidemia LDL well-controlled at 62, triglycerides slightly elevated, managed with fenofibrate  and Lipitor. - Continue fenofibrate  and Lipitor. - Encourage dietary modifications for triglycerides.  Fatty liver disease Liver function tests stable, condition monitored. - Continue monitoring liver function tests.  Peripheral edema Mild ankle swelling, good circulation. - Elevate legs while resting. - Encourage walking and movement. - Consider compression socks if swelling worsens.  Hypothyroidism Thyroid  levels stable with TSH of 1.9, managed with levothyroxine . - Continue levothyroxine .          Follow up plan: Return in about 6 months (around 08/05/2024), or if symptoms worsen or fail to improve, for Hypertension and hyperlipidemia and hypertriglyceridemia recheck.  Counseling provided for all of the vaccine components No orders of the defined types were placed in this encounter.   Fonda Levins, MD [facility] 02/03/2024, 8:21 AM

## 2024-02-08 ENCOUNTER — Ambulatory Visit (INDEPENDENT_AMBULATORY_CARE_PROVIDER_SITE_OTHER)

## 2024-02-08 DIAGNOSIS — M8588 Other specified disorders of bone density and structure, other site: Secondary | ICD-10-CM | POA: Diagnosis not present

## 2024-02-11 DIAGNOSIS — Z780 Asymptomatic menopausal state: Secondary | ICD-10-CM | POA: Diagnosis not present

## 2024-02-11 DIAGNOSIS — M85852 Other specified disorders of bone density and structure, left thigh: Secondary | ICD-10-CM | POA: Diagnosis not present

## 2024-02-18 ENCOUNTER — Ambulatory Visit: Payer: Self-pay | Admitting: Family Medicine

## 2024-02-23 ENCOUNTER — Other Ambulatory Visit: Payer: Self-pay

## 2024-02-23 DIAGNOSIS — Z170 Estrogen receptor positive status [ER+]: Secondary | ICD-10-CM

## 2024-02-24 ENCOUNTER — Inpatient Hospital Stay: Payer: Medicare Other | Admitting: Hematology and Oncology

## 2024-02-24 ENCOUNTER — Inpatient Hospital Stay: Payer: Medicare Other | Attending: Hematology and Oncology

## 2024-02-24 ENCOUNTER — Inpatient Hospital Stay: Payer: Medicare Other

## 2024-02-24 VITALS — BP 156/76 | HR 85 | Temp 98.8°F | Resp 16 | Ht 64.0 in | Wt 223.2 lb

## 2024-02-24 DIAGNOSIS — I10 Essential (primary) hypertension: Secondary | ICD-10-CM | POA: Insufficient documentation

## 2024-02-24 DIAGNOSIS — Z79811 Long term (current) use of aromatase inhibitors: Secondary | ICD-10-CM | POA: Insufficient documentation

## 2024-02-24 DIAGNOSIS — E039 Hypothyroidism, unspecified: Secondary | ICD-10-CM | POA: Insufficient documentation

## 2024-02-24 DIAGNOSIS — Z1721 Progesterone receptor positive status: Secondary | ICD-10-CM | POA: Insufficient documentation

## 2024-02-24 DIAGNOSIS — Z17411 Hormone receptor positive with human epidermal growth factor receptor 2 negative status: Secondary | ICD-10-CM | POA: Diagnosis not present

## 2024-02-24 DIAGNOSIS — C50412 Malignant neoplasm of upper-outer quadrant of left female breast: Secondary | ICD-10-CM | POA: Diagnosis not present

## 2024-02-24 DIAGNOSIS — Z79890 Hormone replacement therapy (postmenopausal): Secondary | ICD-10-CM | POA: Insufficient documentation

## 2024-02-24 DIAGNOSIS — Z170 Estrogen receptor positive status [ER+]: Secondary | ICD-10-CM | POA: Insufficient documentation

## 2024-02-24 DIAGNOSIS — Z79899 Other long term (current) drug therapy: Secondary | ICD-10-CM | POA: Diagnosis not present

## 2024-02-24 DIAGNOSIS — Z7952 Long term (current) use of systemic steroids: Secondary | ICD-10-CM | POA: Insufficient documentation

## 2024-02-24 DIAGNOSIS — Z923 Personal history of irradiation: Secondary | ICD-10-CM | POA: Diagnosis not present

## 2024-02-24 DIAGNOSIS — M8580 Other specified disorders of bone density and structure, unspecified site: Secondary | ICD-10-CM | POA: Insufficient documentation

## 2024-02-24 DIAGNOSIS — M85859 Other specified disorders of bone density and structure, unspecified thigh: Secondary | ICD-10-CM

## 2024-02-24 LAB — CMP (CANCER CENTER ONLY)
ALT: 48 U/L — ABNORMAL HIGH (ref 0–44)
AST: 41 U/L (ref 15–41)
Albumin: 4.5 g/dL (ref 3.5–5.0)
Alkaline Phosphatase: 75 U/L (ref 38–126)
Anion gap: 9 (ref 5–15)
BUN: 17 mg/dL (ref 8–23)
CO2: 28 mmol/L (ref 22–32)
Calcium: 10 mg/dL (ref 8.9–10.3)
Chloride: 103 mmol/L (ref 98–111)
Creatinine: 0.81 mg/dL (ref 0.44–1.00)
GFR, Estimated: >60 mL/min (ref >60)
Glucose, Bld: 174 mg/dL — ABNORMAL HIGH (ref 70–99)
Potassium: 3.5 mmol/L (ref 3.5–5.1)
Sodium: 140 mmol/L (ref 135–145)
Total Bilirubin: 0.3 mg/dL (ref 0.0–1.2)
Total Protein: 7.5 g/dL (ref 6.5–8.1)

## 2024-02-24 LAB — CBC WITH DIFFERENTIAL (CANCER CENTER ONLY)
Abs Immature Granulocytes: 0.07 K/uL (ref 0.00–0.07)
Basophils Absolute: 0.1 K/uL (ref 0.0–0.1)
Basophils Relative: 1 %
Eosinophils Absolute: 0.2 K/uL (ref 0.0–0.5)
Eosinophils Relative: 3 %
HCT: 43.9 % (ref 36.0–46.0)
Hemoglobin: 14.6 g/dL (ref 12.0–15.0)
Immature Granulocytes: 1 %
Lymphocytes Relative: 21 %
Lymphs Abs: 1.3 K/uL (ref 0.7–4.0)
MCH: 31.1 pg (ref 26.0–34.0)
MCHC: 33.3 g/dL (ref 30.0–36.0)
MCV: 93.6 fL (ref 80.0–100.0)
Monocytes Absolute: 0.5 K/uL (ref 0.1–1.0)
Monocytes Relative: 8 %
Neutro Abs: 4.2 K/uL (ref 1.7–7.7)
Neutrophils Relative %: 66 %
Platelet Count: 241 K/uL (ref 150–400)
RBC: 4.69 MIL/uL (ref 3.87–5.11)
RDW: 12.8 % (ref 11.5–15.5)
WBC Count: 6.3 K/uL (ref 4.0–10.5)
nRBC: 0 % (ref 0.0–0.2)

## 2024-02-24 MED ORDER — ZOLEDRONIC ACID 4 MG/100ML IV SOLN
4.0000 mg | Freq: Once | INTRAVENOUS | Status: AC
  Administered 2024-02-24: 4 mg via INTRAVENOUS
  Filled 2024-02-24: qty 100

## 2024-02-24 MED ORDER — SODIUM CHLORIDE 0.9 % IV SOLN: Freq: Once | INTRAVENOUS | Status: AC

## 2024-02-24 NOTE — Patient Instructions (Signed)

## 2024-02-24 NOTE — Progress Notes (Signed)
 Oncology History  Malignant neoplasm of upper-outer quadrant of left breast in female, estrogen receptor positive (HCC)  03/12/2021 Initial Diagnosis   status post left breast upper outer quadrant biopsy 03/12/2021 for a clinical T1c N0, stage IA invasive ductal carcinoma, grade 2, estrogen and progesterone receptor positive, HER2 not amplified (FISH results pending) with an MIB-1 of 30%   03/19/2021 Cancer Staging   Staging form: Breast, AJCC 8th Edition - Clinical stage from 03/19/2021: Stage IA (cT1c, cN0, cM0, G2, ER+, PR+, HER2-) - Signed by Layla Sandria BROCKS, MD on 03/19/2021 Stage prefix: Initial diagnosis Histologic grading system: 3 grade system Laterality: Left Staged by: Pathologist and managing physician Stage used in treatment planning: Yes National guidelines used in treatment planning: Yes Type of national guideline used in treatment planning: NCCN   03/27/2021 Cancer Staging   Staging form: Breast, AJCC 8th Edition - Pathologic stage from 03/27/2021: Stage IA (pT1b, pN0, cM0, G3, ER+, PR+, HER2-) - Signed by Crawford Morna Pickle, NP on 07/19/2021 Histologic grading system: 3 grade system   03/27/2021 Surgery   status post left lumpectomy and sentinel lymph node sampling 03/27/2021 for a pT1b pN0, stage IA invasive ductal carcinoma, grade 3, with negative margins.             (a) 2 left axillary lymph nodes were removed     03/27/2021 Oncotype testing   Oncotype score of 24 predicts a risk of recurrence outside the breast over the next 9 years of 10% if the patient's only systemic therapy is antiestrogens for 5 years.  It also predicts minimal benefit from adjuvant chemotherapy   05/01/2021 - 06/03/2021 Radiation Therapy   05/01/2021 through 06/03/2021 Site Technique Total Dose (Gy) Dose per Fx (Gy) Completed Fx Beam Energies  Breast, Left: Breast_Lt 3D 42.56/42.56 2.66 16/16 10X  Breast, Left: Breast_Lt_Bst 3D 8/8 2 4/4 6X, 10X     05/29/2021 -  Anti-estrogen oral therapy    Anastrozole  daily     INTERVAL HISTORY:   Laurie Mckenzie is a 73 year old female with breast cancer who presents for follow-up.    Discussed the use of AI scribe software for clinical note transcription with the patient, who gave verbal consent to proceed.  History of Present Illness Laurie Mckenzie is a 73 year old female who presents for follow-up and treatment management of osteopenia.  She is currently undergoing treatment for osteopenia and has been receiving Zometa , having completed two doses with plans for a third dose this week. She has been on anastrozole  for almost three years, taking it daily, and tolerates the medication well.  A mammogram was scheduled but canceled due to a procedure to remove scar tissue. She is expected to reschedule the mammogram for November 2025.  REVIEW OF SYSTEMS:  Review of Systems  Constitutional:  Negative for appetite change, chills, fatigue, fever and unexpected weight change.  HENT:   Negative for hearing loss, lump/mass and trouble swallowing.   Eyes:  Negative for eye problems and icterus.  Respiratory:  Negative for chest tightness, cough and shortness of breath.   Cardiovascular:  Negative for chest pain, leg swelling and palpitations.  Gastrointestinal:  Negative for abdominal distention, abdominal pain, constipation, diarrhea, nausea and vomiting.  Endocrine: Positive for hot flashes.  Genitourinary:  Negative for difficulty urinating.   Musculoskeletal:  Negative for arthralgias.  Skin:  Negative for itching and rash.  Neurological:  Negative for dizziness, extremity weakness, headaches and numbness.  Hematological:  Negative for adenopathy. Does  not bruise/bleed easily.  Psychiatric/Behavioral:  Negative for depression. The patient is not nervous/anxious.    Breast: Denies any new nodularity, masses, tenderness, nipple changes, or nipple discharge.   ONCOLOGY TREATMENT TEAM:  1. Surgeon:  Dr. Vanderbilt at [facility] 2.  Medical Oncologist: Dr. Loretha  3. Radiation Oncologist: Dr. Dewey    PAST MEDICAL/SURGICAL HISTORY:  Past Medical History:  Diagnosis Date   Cancer [facility])    Headache    Hypertension    186/106 today    195/108 07/27/2017   Hypothyroidism    Primary osteoarthritis of knee    Right   Past Surgical History:  Procedure Laterality Date   BREAST LUMPECTOMY WITH RADIOACTIVE SEED AND SENTINEL LYMPH NODE BIOPSY Left 03/27/2021   Procedure: LEFT BREAST LUMPECTOMY WITH RADIOACTIVE SEED AND SENTINEL LYMPH NODE BIOPSY;  Surgeon: Vanderbilt Ned, MD;  Location: [facility];  Service: General;  Laterality: Left;   BREAST LUMPECTOMY WITH RADIOACTIVE SEED LOCALIZATION Left 05/05/2023   Procedure: RADIOACTIVE SEED GUIDED LEFT BREAST LUMPECTOMY;  Surgeon: Vanderbilt Ned, MD;  Location: [facility];  Service: General;  Laterality: Left;   COLONOSCOPY N/A 01/16/2015   Procedure: COLONOSCOPY;  Surgeon: Lamar CHRISTELLA Hollingshead, MD;  [facility];  Service: Endoscopy;  Laterality: N/A;  8:30 AM   KNEE ARTHROPLASTY Left 2011   [place]   TOTAL KNEE ARTHROPLASTY Right 08/23/2017   Procedure: TOTAL KNEE ARTHROPLASTY;  Surgeon: Jane Lamar, MD;  Location: [facility];  Service: Orthopedics;  Laterality: Right;     ALLERGIES:  Allergies  Allergen Reactions   Sulfa Antibiotics Other (See Comments)   Bee Venom Swelling and Other (See Comments)    Red and hot to site   Fish Allergy Hives   Ivp Dye [Iodinated Contrast Media] Other (See Comments)    MD advised not to use, due to fish allergies     CURRENT MEDICATIONS:  Outpatient Encounter Medications as of 02/24/2024  Medication Sig Note   acetaminophen  (TYLENOL ) 500 MG tablet Take 1,000-1,500 mg by mouth every 6 (six) hours as needed for moderate pain or headache.     amLODipine  (NORVASC ) 5 MG tablet Take 1 tablet (5 mg total) by mouth daily.    anastrozole  (ARIMIDEX ) 1 MG tablet TAKE ONE TABLET ONCE DAILY    Ascorbic Acid  (VITAMIN C) 100 MG tablet Take 100 mg by mouth daily.    atorvastatin  (LIPITOR) 20 MG tablet Take 1 tablet (20 mg total) by mouth daily.    calcium  carbonate (OSCAL) 1500 (600 Ca) MG TABS tablet Take 600 mg by mouth 2 (two) times daily with a meal.    cetirizine  (ZYRTEC ) 10 MG tablet Take 1 tablet (10 mg total) by mouth daily.    Cholecalciferol (VITAMIN D3 PO) Take 1 capsule by mouth daily. 12/09/2021: 1000 IU   diphenhydrAMINE  (BENADRYL ) 25 MG tablet Take 25 mg by mouth once.    EPINEPHRINE  0.3 mg/0.3 mL IJ SOAJ injection Inject 0.3 mLs (0.3 mgttotal) into the muscle for 1 dose.    fenofibrate  (TRICOR ) 48 MG tablet TAKE ONE TABLET DAILY    ibuprofen  (ADVIL ) 800 MG tablet Take 1 tablet (800 mg total) by mouth every 8 (eight) hours as needed.    levothyroxine  (SYNTHROID ) 150 MCG tablet Take 1 tablet (150 mcg total) by mouth daily.    montelukast  (SINGULAIR ) 10 MG tablet TAKE ONE TABLET AT BEDTIME    Multiple Vitamin (MULTIVITAMIN) tablet Take 1 tablet by mouth daily.    triamcinolone  (  NASACORT ) 55 MCG/ACT AERO nasal inhaler Place 2 sprays into the nose 2 (two) times daily.    triamcinolone  cream (KENALOG ) 0.1 % Apply 1 Application topically 2 (two) times daily.    zinc gluconate 50 MG tablet Take 50 mg by mouth daily.    predniSONE  (DELTASONE ) 10 MG tablet Take 6 tabs daily x 2 days, 5 tabs daily x 2 days, 4 tabs daily x 2 days, etc (Patient not taking: Reported on 02/24/2024)    triamcinolone  ointment (KENALOG ) 0.1 % Apply 1 Application topically 2 (two) times daily. (Patient not taking: Reported on 02/24/2024)    No facility-administered encounter medications on file as of 02/24/2024.     ONCOLOGIC FAMILY HISTORY:  Family History  Problem Relation Age of Onset   Hypertension Mother    Lung disease Father      GENETIC COUNSELING/TESTING: Not at this time  SOCIAL HISTORY:  Social History   Socioeconomic History   Marital status: Single    Spouse name: Not on file   Number of  children: 0   Years of education: Not on file   Highest education level: Not on file  Occupational History   Occupation: retired    Comment: 2017  Tobacco Use   Smoking status: Never   Smokeless tobacco: Never  Vaping Use   Vaping status: Never Used  Substance and Sexual Activity   Alcohol use: No   Drug use: No   Sexual activity: Not Currently    Comment: not in relationship currently  Other Topics Concern   Not on file  Social History Narrative   Not on file   Social Drivers of Health   Financial Resource Strain: Low Risk  (12/13/2023)   Overall Financial Resource Strain (CARDIA)    Difficulty of Paying Living Expenses: Not hard at all  Food Insecurity: No Food Insecurity (12/13/2023)   Hunger Vital Sign    Worried About Running Out of Food in the Last Year: Never true    Ran Out of Food in the Last Year: Never true  Transportation Needs: No Transportation Needs (12/13/2023)   PRAPARE - Administrator, Civil Service (Medical): No    Lack of Transportation (Non-Medical): No  Physical Activity: Insufficiently Active (12/13/2023)   Exercise Vital Sign    Days of Exercise per Week: 1 day    Minutes of Exercise per Session: 20 min  Stress: No Stress Concern Present (12/13/2023)   [employer] of Occupational Health - Occupational Stress Questionnaire    Feeling of Stress: Not at all  Social Connections: Moderately Isolated (12/13/2023)   Social Connection and Isolation Panel    Frequency of Communication with Friends and Family: More than three times a week    Frequency of Social Gatherings with Friends and Family: More than three times a week    Attends Religious Services: More than 4 times per year    Active Member of [employer] or Organizations: No    Attends Banker Meetings: Never    Marital Status: Never married  Intimate Partner Violence: Not At Risk (12/13/2023)   Humiliation, Afraid, Rape, and Kick questionnaire    Fear of Current or  Ex-Partner: No    Emotionally Abused: No    Physically Abused: No    Sexually Abused: No     OBSERVATIONS/OBJECTIVE:  BP (!) 156/76 (BP Location: Right Arm, Patient Position: Sitting, Cuff Size: Normal)   Pulse 85   Temp 98.8 F (37.1 C) (Temporal)   Resp  16   Wt 223 lb 3.2 oz (101.2 kg)   SpO2 98%   BMI 38.31 kg/m   General: alert, oriented in no acute distress Breasts: Bilateral breasts inspected and palpated. Left breast with scar tissue near the surgical site. No other palpable masses. No regional adenopathy NO LE edema   LABORATORY DATA:  None for this visit.  DIAGNOSTIC IMAGING:  None for this visit.      ASSESSMENT AND PLAN:    Ms.. Sandin is a pleasant 73 y.o. female with Stage IA left breast invasive ductal carcinoma, ER+/PR+/HER2-, diagnosed in 02/2021, treated with lumpectomy, adjuvant radiation therapy, and anti-estrogen therapy with Anastrozole  beginning in 05/2021.  DEXA scan scheduled for Aug 2025, last bone density showed osteopenia. Pt on zometa .  Breast Cancer On Anastrozole  with intermittent hot flashes, mostly at night.   No new breast masses on self-examination. -Continue Anastrozole  till Dec 2027. -Continue self-breast examinations.  Osteopenia and aromatase inhibitors. She completed 2 doses of zometa  Will do 2 more doses and discontinue.  General Health Maintenance -Encourage weight-bearing exercises for bone health. -Schedule follow-up visit in six months with labs on the same day.   *Total Encounter Time as defined by the Centers for Medicare and Medicaid Services includes, in addition to the face-to-face time of a patient visit (documented in the note above) non-face-to-face time: obtaining and reviewing outside history, ordering and reviewing medications, tests or procedures, care coordination (communications with other health care professionals or caregivers) and documentation in the medical record.

## 2024-03-21 ENCOUNTER — Other Ambulatory Visit: Payer: Self-pay | Admitting: Family Medicine

## 2024-03-23 DIAGNOSIS — H524 Presbyopia: Secondary | ICD-10-CM | POA: Diagnosis not present

## 2024-04-05 ENCOUNTER — Other Ambulatory Visit: Payer: Self-pay | Admitting: Family Medicine

## 2024-05-05 LAB — HM MAMMOGRAPHY

## 2024-06-13 ENCOUNTER — Telehealth: Payer: Self-pay | Admitting: Hematology and Oncology

## 2024-06-13 NOTE — Telephone Encounter (Signed)
 Left a voicemail for pt regarding 08/28/24 appt being rescheduled to 08/31/24.

## 2024-06-15 ENCOUNTER — Other Ambulatory Visit: Payer: Self-pay | Admitting: Hematology and Oncology

## 2024-07-04 ENCOUNTER — Ambulatory Visit
Admission: EM | Admit: 2024-07-04 | Discharge: 2024-07-04 | Disposition: A | Discharge status: Home / Self Care | Attending: Family Medicine | Admitting: Family Medicine

## 2024-07-04 DIAGNOSIS — J069 Acute upper respiratory infection, unspecified: Secondary | ICD-10-CM

## 2024-07-04 DIAGNOSIS — J3089 Other allergic rhinitis: Secondary | ICD-10-CM | POA: Diagnosis not present

## 2024-07-04 MED ORDER — LEVOCETIRIZINE DIHYDROCHLORIDE 5 MG PO TABS: 5.0000 mg | ORAL_TABLET | Freq: Every evening | ORAL | 2 refills | Status: AC

## 2024-07-04 MED ORDER — AZELASTINE HCL 0.1 % NA SOLN: 1.0000 | Freq: Two times a day (BID) | NASAL | 2 refills | Status: AC

## 2024-07-04 NOTE — ED Provider Notes (Signed)
 " [facility]    CSN: [id number] Arrival date & time: 07/04/24  0803      History   Chief Complaint No chief complaint on file.   HPI Laurie Mckenzie is a 74 y.o. female.   Patient presenting today with 1 day history of nasal congestion, facial pain and pressure, bilateral ear pain, scratchy throat.  Denies fever, chills, chest pain, shortness of breath, vomiting, diarrhea.  So far trying over-the-counter remedies with minimal relief.  History of seasonal allergies and not currently on anything.    Past Medical History:  Diagnosis Date   Cancer [facility])    Headache    Hypertension    186/106 today    195/108 07/27/2017   Hypothyroidism    Primary osteoarthritis of knee    Right    Patient Active Problem List   Diagnosis Date Noted   Type 2 diabetes mellitus with other specified complication (HCC) 02/03/2024   Cough 05/21/2023   Osteopenia 11/27/2022   Hypertriglyceridemia 07/17/2022   Malignant neoplasm of upper-outer quadrant of left breast in female, estrogen receptor positive (HCC) 03/17/2021   Primary localized osteoarthritis of right knee 08/23/2017   Hypothyroidism    Hypertension    Chronic allergic rhinitis 03/18/2015   Obesity (BMI 30-39.9) 02/20/2014   Hyperlipidemia 12/25/2013    Past Surgical History:  Procedure Laterality Date   BREAST LUMPECTOMY WITH RADIOACTIVE SEED AND SENTINEL LYMPH NODE BIOPSY Left 03/27/2021   Procedure: LEFT BREAST LUMPECTOMY WITH RADIOACTIVE SEED AND SENTINEL LYMPH NODE BIOPSY;  Surgeon: Vanderbilt Ned, MD;  Location: [facility];  Service: General;  Laterality: Left;   BREAST LUMPECTOMY WITH RADIOACTIVE SEED LOCALIZATION Left 05/05/2023   Procedure: RADIOACTIVE SEED GUIDED LEFT BREAST LUMPECTOMY;  Surgeon: Vanderbilt Ned, MD;  Location: [facility];  Service: General;  Laterality: Left;   COLONOSCOPY N/A 01/16/2015   Procedure: COLONOSCOPY;  Surgeon: Lamar CHRISTELLA Hollingshead, MD;  [facility];   Service: Endoscopy;  Laterality: N/A;  8:30 AM   KNEE ARTHROPLASTY Left 2011   [place]   TOTAL KNEE ARTHROPLASTY Right 08/23/2017   Procedure: TOTAL KNEE ARTHROPLASTY;  Surgeon: Jane Lamar, MD;  Location: [facility];  Service: Orthopedics;  Laterality: Right;    OB History   No obstetric history on file.      Home Medications    Prior to Admission medications  Medication Sig Start Date End Date Taking? Authorizing Provider  azelastine  (ASTELIN ) 0.1 % nasal spray Place 1 spray into both nostrils 2 (two) times daily. Use in each nostril as directed 07/04/24  Yes Stuart Vernell Norris, PA-C  levocetirizine (XYZAL ) 5 MG tablet Take 1 tablet (5 mg total) by mouth every evening. 07/04/24  Yes Stuart Vernell Norris, PA-C  acetaminophen  (TYLENOL ) 500 MG tablet Take 1,000-1,500 mg by mouth every 6 (six) hours as needed for moderate pain or headache.     [provider]  amLODipine  (NORVASC ) 5 MG tablet Take 1 tablet (5 mg total) by mouth daily. 08/05/23   Dettinger, Fonda LABOR, MD  anastrozole  (ARIMIDEX ) 1 MG tablet TAKE ONE TABLET ONCE DAILY 06/15/24   Iruku, Praveena, MD  Ascorbic Acid (VITAMIN C) 100 MG tablet Take 100 mg by mouth daily.    [provider]  atorvastatin  (LIPITOR) 20 MG tablet Take 1 tablet (20 mg total) by mouth daily. 08/05/23   Dettinger, Fonda LABOR, MD  calcium  carbonate (OSCAL) 1500 (600 Ca) MG TABS tablet Take 600 mg by mouth 2 (two) times  daily with a meal.    [provider]  cetirizine  (ZYRTEC ) 10 MG tablet Take 1 tablet (10 mg total) by mouth daily. 05/21/23   Leath-Warren, Etta PARAS, NP  Cholecalciferol (VITAMIN D3 PO) Take 1 capsule by mouth daily.    [provider]  diphenhydrAMINE  (BENADRYL ) 25 MG tablet Take 25 mg by mouth once.    [provider]  EPINEPHRINE  0.3 mg/0.3 mL IJ SOAJ injection Inject 0.3 mLs (0.3 mgttotal) into the muscle for 1 dose. 03/29/23   Dettinger, Fonda LABOR, MD  fenofibrate  (TRICOR ) 48 MG tablet TAKE  ONE TABLET DAILY 04/05/24   Dettinger, Fonda LABOR, MD  ibuprofen  (ADVIL ) 800 MG tablet Take 1 tablet (800 mg total) by mouth every 8 (eight) hours as needed. 03/27/21   Cornett, Debby, MD  levothyroxine  (SYNTHROID ) 150 MCG tablet Take 1 tablet (150 mcg total) by mouth daily. 08/05/23   Dettinger, Fonda LABOR, MD  montelukast  (SINGULAIR ) 10 MG tablet TAKE ONE TABLET AT BEDTIME 03/21/24   Dettinger, Fonda LABOR, MD  Multiple Vitamin (MULTIVITAMIN) tablet Take 1 tablet by mouth daily.    [provider]  predniSONE  (DELTASONE ) 10 MG tablet Take 6 tabs daily x 2 days, 5 tabs daily x 2 days, 4 tabs daily x 2 days, etc Patient not taking: Reported on 02/24/2024 11/09/23   Stuart Vernell Norris, PA-C  triamcinolone  (NASACORT ) 55 MCG/ACT AERO nasal inhaler Place 2 sprays into the nose 2 (two) times daily.    [provider]  triamcinolone  cream (KENALOG ) 0.1 % Apply 1 Application topically 2 (two) times daily. 11/09/23   Stuart Vernell Norris, PA-C  triamcinolone  ointment (KENALOG ) 0.1 % Apply 1 Application topically 2 (two) times daily. Patient not taking: Reported on 12/01/2023 12/21/22   Chandra Raisin A, NP  zinc gluconate 50 MG tablet Take 50 mg by mouth daily.    [provider]    Family History Family History  Problem Relation Age of Onset   Hypertension Mother    Lung disease Father     Social History Social History[1]   Allergies   Sulfa antibiotics, Bee venom, Fish allergy, and Ivp dye [iodinated contrast media]   Review of Systems Review of Systems PER HPI  Physical Exam Triage Vital Signs ED Triage Vitals  Encounter Vitals Group     BP 07/04/24 0818 (!) 170/101     Girls Systolic BP Percentile --      Girls Diastolic BP Percentile --      Boys Systolic BP Percentile --      Boys Diastolic BP Percentile --      Pulse Rate 07/04/24 0818 95     Resp 07/04/24 0818 18     Temp 07/04/24 0818 98.1 F (36.7 C)     Temp Source 07/04/24 0818 Oral     SpO2  07/04/24 0818 95 %     Weight --      Height --      Head Circumference --      Peak Flow --      Pain Score 07/04/24 0817 2     Pain Loc --      Pain Education --      Exclude from Growth Chart --    No data found.  Updated Vital Signs BP (!) 147/87   Pulse 95   Temp 98.1 F (36.7 C) (Oral)   Resp 18   SpO2 95%   Visual Acuity Right Eye Distance:   Left Eye Distance:  Bilateral Distance:    Right Eye Near:   Left Eye Near:    Bilateral Near:     Physical Exam Vitals and nursing note reviewed.  Constitutional:      Appearance: Normal appearance.  HENT:     Head: Atraumatic.     Right Ear: Tympanic membrane and external ear normal.     Left Ear: Tympanic membrane and external ear normal.     Nose: Rhinorrhea present.     Mouth/Throat:     Mouth: Mucous membranes are moist.     Pharynx: Posterior oropharyngeal erythema present.  Eyes:     Extraocular Movements: Extraocular movements intact.     Conjunctiva/sclera: Conjunctivae normal.  Cardiovascular:     Rate and Rhythm: Normal rate and regular rhythm.     Heart sounds: Normal heart sounds.  Pulmonary:     Effort: Pulmonary effort is normal.     Breath sounds: Normal breath sounds. No wheezing or rales.  Musculoskeletal:        General: Normal range of motion.     Cervical back: Normal range of motion and neck supple.  Skin:    General: Skin is warm and dry.  Neurological:     Mental Status: She is alert and oriented to person, place, and time.  Psychiatric:        Mood and Affect: Mood normal.        Thought Content: Thought content normal.     UC Treatments / Results  Labs (all labs ordered are listed, but only abnormal results are displayed) Labs Reviewed - No data to display  EKG   Radiology No results found.  Procedures Procedures (including critical care time)  Medications Ordered in UC Medications - No data to display  Initial Impression / Assessment and Plan / UC Course  I have  reviewed the triage vital signs and the nursing notes.  Pertinent labs & imaging results that were available during my care of the patient were reviewed by me and considered in my medical decision making (see chart for details).     Suspect viral versus allergic symptoms.  Treat with Astelin , Xyzal , supportive over-the-counter medications and home care.  Return for worsening or unresolving symptoms.  Final Clinical Impressions(s) / UC Diagnoses   Final diagnoses:  Viral URI  Seasonal allergic rhinitis due to other allergic trigger     Discharge Instructions      I have sent over a daily antihistamine and twice daily nasal spray to help control seasonal allergy symptoms and your current nasal congestion and ear pressure.  You may additionally take Coricidin HBP, plain Mucinex, use saline sinus rinses several times daily as needed.  Follow-up for worsening or unresolving symptoms    ED Prescriptions     Medication Sig Dispense Auth. Provider   azelastine  (ASTELIN ) 0.1 % nasal spray Place 1 spray into both nostrils 2 (two) times daily. Use in each nostril as directed 30 mL Stuart Vernell Norris, PA-C   levocetirizine (XYZAL ) 5 MG tablet Take 1 tablet (5 mg total) by mouth every evening. 30 tablet Stuart Vernell Norris, [place]      PDMP not reviewed this encounter.    [1]  Social History Tobacco Use   Smoking status: Never   Smokeless tobacco: Never  Vaping Use   Vaping status: Never Used  Substance Use Topics   Alcohol use: No   Drug use: No     Stuart Vernell Norris, PA-C 07/04/24 1713  "

## 2024-07-04 NOTE — ED Triage Notes (Signed)
 Pt reports she has nasal pain/pressure,  bilateral ear pain, scratchy throat x 1 day

## 2024-07-04 NOTE — Discharge Instructions (Signed)
 I have sent over a daily antihistamine and twice daily nasal spray to help control seasonal allergy symptoms and your current nasal congestion and ear pressure.  You may additionally take Coricidin HBP, plain Mucinex, use saline sinus rinses several times daily as needed.  Follow-up for worsening or unresolving symptoms

## 2024-07-10 ENCOUNTER — Ambulatory Visit: Attending: Surgery

## 2024-07-10 ENCOUNTER — Telehealth: Payer: Self-pay | Admitting: Family Medicine

## 2024-07-10 VITALS — Wt 222.5 lb

## 2024-07-10 DIAGNOSIS — M25569 Pain in unspecified knee: Secondary | ICD-10-CM

## 2024-07-10 DIAGNOSIS — Z483 Aftercare following surgery for neoplasm: Secondary | ICD-10-CM | POA: Insufficient documentation

## 2024-07-10 NOTE — Therapy (Signed)
 " OUTPATIENT PHYSICAL THERAPY SOZO SCREENING NOTE   Patient Name: Laurie Mckenzie MRN: [id number] DOB:[birth year], 74 y.o., female Today's Date: 07/10/2024  PCP: Dettinger, Fonda LABOR, MD REFERRING PROVIDER: Vanderbilt Ned, MD   PT End of Session - 07/10/24 [phone]     Visit Number 2   # unchanged due to screen only   PT Start Time 0936    PT Stop Time 0940    PT Time Calculation (min) 4 min    Activity Tolerance Patient tolerated treatment well    Behavior During Therapy [facility] for tasks assessed/performed          Past Medical History:  Diagnosis Date   Cancer (HCC)    Headache    Hypertension    186/106 today    195/108 07/27/2017   Hypothyroidism    Primary osteoarthritis of knee    Right   Past Surgical History:  Procedure Laterality Date   BREAST LUMPECTOMY WITH RADIOACTIVE SEED AND SENTINEL LYMPH NODE BIOPSY Left 03/27/2021   Procedure: LEFT BREAST LUMPECTOMY WITH RADIOACTIVE SEED AND SENTINEL LYMPH NODE BIOPSY;  Surgeon: Vanderbilt Ned, MD;  Location: [facility];  Service: General;  Laterality: Left;   BREAST LUMPECTOMY WITH RADIOACTIVE SEED LOCALIZATION Left 05/05/2023   Procedure: RADIOACTIVE SEED GUIDED LEFT BREAST LUMPECTOMY;  Surgeon: Vanderbilt Ned, MD;  Location: [facility];  Service: General;  Laterality: Left;   COLONOSCOPY N/A 01/16/2015   Procedure: COLONOSCOPY;  Surgeon: Lamar CHRISTELLA Hollingshead, MD;  [facility];  Service: Endoscopy;  Laterality: N/A;  8:30 AM   KNEE ARTHROPLASTY Left 2011   [place]   TOTAL KNEE ARTHROPLASTY Right 08/23/2017   Procedure: TOTAL KNEE ARTHROPLASTY;  Surgeon: Jane Lamar, MD;  Location: [facility];  Service: Orthopedics;  Laterality: Right;   Patient Active Problem List   Diagnosis Date Noted   Type 2 diabetes mellitus with other specified complication (HCC) 02/03/2024   Cough 05/21/2023   Osteopenia 11/27/2022   Hypertriglyceridemia 07/17/2022   Malignant neoplasm of upper-outer quadrant of  left breast in female, estrogen receptor positive (HCC) 03/17/2021   Primary localized osteoarthritis of right knee 08/23/2017   Hypothyroidism    Hypertension    Chronic allergic rhinitis 03/18/2015   Obesity (BMI 30-39.9) 02/20/2014   Hyperlipidemia 12/25/2013    REFERRING DIAG: left breast cancer at risk for lymphedema  THERAPY DIAG: Aftercare following surgery for neoplasm  PERTINENT HISTORY: Patient was diagnosed on 02/13/2021 with 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 She underwent a left lumpectomy and sentinel node biopsy on 03/27/2021 with 2 negative nodes removed. It is ER/PR positive and HER2 negative with a Ki67 of 15%  PRECAUTIONS: left UE Lymphedema risk, None  SUBJECTIVE: Pt returns for her 6 month L-Dex screen. I have to have a Lt TKR soon. It isn't scheduled yet but will be soon.  PAIN:  Are you having pain? No  SOZO SCREENING: Patient was assessed today using the SOZO machine to determine the lymphedema index score. This was compared to her baseline score. It was determined that she is within the recommended range when compared to her baseline and no further action is needed at this time. She will continue SOZO screenings. These are done every 3 months for 2 years post operatively followed by every 6 months for 2 years, and then annually.   L-DEX FLOWSHEETS - 07/10/24 0900       L-DEX LYMPHEDEMA SCREENING   Measurement Type Unilateral    L-DEX MEASUREMENT  EXTREMITY Upper Extremity    POSITION  Standing    DOMINANT SIDE Right    At Risk Side Left    BASELINE SCORE (UNILATERAL) 2.5    L-DEX SCORE (UNILATERAL) 2.3    VALUE CHANGE (UNILAT) -0.2         P: Cont 6 month L-Dex screens until 02/2025 then can transition to annual.     Aden Berwyn Caldron, PTA 07/10/2024, 9:38 AM    "

## 2024-07-10 NOTE — Telephone Encounter (Signed)
 REFERRAL REQUEST Telephone Note  Have you been seen at our office for this problem? Yes has surgical clearance 01/16 (Advise that they may need an appointment with their PCP before a referral can be done)  Reason for Referral: knee surgery  Referral discussed with patient: yes  Best contact number of patient for referral team: [phone]    Has patient been seen by a specialist for this issue before: yes/ insurance changed this year and she needs new referral  [facility] provider preference for referral: [facility]  Patient location preference for referral: [facility]   Patient notified that referrals can take up to a week or longer to process. If they haven't heard anything within a week they should call back and speak with the referral department.

## 2024-07-10 NOTE — Telephone Encounter (Signed)
 Referral placed.

## 2024-07-14 ENCOUNTER — Encounter: Payer: Self-pay | Admitting: Family Medicine

## 2024-07-14 ENCOUNTER — Ambulatory Visit

## 2024-07-14 VITALS — BP 144/80 | HR 86 | Ht 64.0 in | Wt 221.0 lb

## 2024-07-14 DIAGNOSIS — Z01818 Encounter for other preprocedural examination: Secondary | ICD-10-CM | POA: Diagnosis not present

## 2024-07-14 DIAGNOSIS — E1169 Type 2 diabetes mellitus with other specified complication: Secondary | ICD-10-CM | POA: Diagnosis not present

## 2024-07-14 LAB — COAGUCHEK XS/INR WAIVED
INR: 0.9 (ref 0.9–1.1)
Prothrombin Time: 11 s

## 2024-07-14 LAB — BAYER DCA HB A1C WAIVED: HB A1C (BAYER DCA - WAIVED): 6.2 % — ABNORMAL HIGH (ref 4.8–5.6)

## 2024-07-14 NOTE — Progress Notes (Signed)
 "  BP (!) 144/80   Pulse 86   Ht 5' 4 (1.626 m)   Wt 221 lb (100.2 kg)   SpO2 95%   BMI 37.93 kg/m    Subjective:   Patient ID: Laurie Mckenzie, female    DOB: 1951/06/10, 74 y.o.   MRN: [id number]  HPI: Laurie Mckenzie is a 74 y.o. female presenting on 07/14/2024 for Surgical Clearance   Discussed the use of AI scribe software for clinical note transcription with the patient, who gave verbal consent to proceed.  History of Present Illness   Laurie Mckenzie is a 74 year old female who presents for surgical clearance for a knee replacement revision.  Knee pain and dysfunction - Scheduled for full knee replacement revision due to persistent symptoms following partial knee replacement in 2008, which was expected to last 10-12 years. - Cortisone injection administered in November 2025 did not provide symptom relief.  Cardiopulmonary symptoms - No chest pain. - No shortness of breath. - No difficulty walking up stairs.  Medication use - Takes thyroid  medication. - Uses Tylenol  as needed. - No use of ibuprofen , Advil , or Aleve. - No use of blood thinners. - Confirmed taking medications this morning.          Relevant past medical, surgical, family and social history reviewed and updated as indicated. Interim medical history since our last visit reviewed. Allergies and medications reviewed and updated.  Review of Systems  Constitutional:  Negative for chills and fever.  Eyes:  Negative for visual disturbance.  Respiratory:  Negative for chest tightness and shortness of breath.   Cardiovascular:  Negative for chest pain and leg swelling.  Genitourinary:  Negative for difficulty urinating and dysuria.  Musculoskeletal:  Negative for back pain and gait problem.  Skin:  Negative for rash.  Neurological:  Negative for dizziness, light-headedness and headaches.  Psychiatric/Behavioral:  Negative for agitation and behavioral problems.   All other systems reviewed and are  negative.   Per HPI unless specifically indicated above   Allergies as of 07/14/2024       Reactions   Sulfa Antibiotics Other (See Comments)   Bee Venom Swelling, Other (See Comments)   Red and hot to site   Fish Allergy Hives   Ivp Dye [iodinated Contrast Media] Other (See Comments)   MD advised not to use, due to fish allergies        Medication List        Accurate as of July 14, 2024  9:34 AM. If you have any questions, ask your nurse or doctor.          STOP taking these medications    predniSONE  10 MG tablet Commonly known as: DELTASONE  Stopped by: Fonda Levins, MD   triamcinolone  cream 0.1 % Commonly known as: KENALOG  Stopped by: Fonda Levins, MD   triamcinolone  ointment 0.1 % Commonly known as: KENALOG  Stopped by: Fonda Levins, MD       TAKE these medications    acetaminophen  500 MG tablet Commonly known as: TYLENOL  Take 1,000-1,500 mg by mouth every 6 (six) hours as needed for moderate pain or headache.   amLODipine  5 MG tablet Commonly known as: NORVASC  Take 1 tablet (5 mg total) by mouth daily.   anastrozole  1 MG tablet Commonly known as: ARIMIDEX  TAKE ONE TABLET ONCE DAILY   atorvastatin  20 MG tablet Commonly known as: LIPITOR Take 1 tablet (20 mg total) by mouth daily.   azelastine  0.1 % nasal spray Commonly  known as: ASTELIN  Place 1 spray into both nostrils 2 (two) times daily. Use in each nostril as directed   calcium  carbonate 1500 (600 Ca) MG Tabs tablet Commonly known as: OSCAL Take 600 mg by mouth 2 (two) times daily with a meal.   cetirizine  10 MG tablet Commonly known as: ZYRTEC  Take 1 tablet (10 mg total) by mouth daily.   diphenhydrAMINE  25 MG tablet Commonly known as: BENADRYL  Take 25 mg by mouth once.   EPINEPHrine  0.3 mg/0.3 mL Soaj injection Commonly known as: EPI-PEN Inject 0.3 mLs (0.3 mgttotal) into the muscle for 1 dose.   fenofibrate  48 MG tablet Commonly known as: TRICOR  TAKE ONE  TABLET DAILY   ibuprofen  800 MG tablet Commonly known as: ADVIL  Take 1 tablet (800 mg total) by mouth every 8 (eight) hours as needed.   levocetirizine 5 MG tablet Commonly known as: XYZAL  Take 1 tablet (5 mg total) by mouth every evening.   levothyroxine  150 MCG tablet Commonly known as: SYNTHROID  Take 1 tablet (150 mcg total) by mouth daily.   montelukast  10 MG tablet Commonly known as: SINGULAIR  TAKE ONE TABLET AT BEDTIME   multivitamin tablet Take 1 tablet by mouth daily.   triamcinolone  55 MCG/ACT Aero nasal inhaler Commonly known as: NASACORT  Place 2 sprays into the nose 2 (two) times daily.   vitamin C 100 MG tablet Take 100 mg by mouth daily.   VITAMIN D3 PO Take 1 capsule by mouth daily.   zinc gluconate 50 MG tablet Take 50 mg by mouth daily.         Objective:   BP (!) 144/80   Pulse 86   Ht 5' 4 (1.626 m)   Wt 221 lb (100.2 kg)   SpO2 95%   BMI 37.93 kg/m   Wt Readings from Last 3 Encounters:  07/14/24 221 lb (100.2 kg)  07/10/24 222 lb 8 oz (100.9 kg)  02/24/24 223 lb 3.2 oz (101.2 kg)    Physical Exam Physical Exam   VITALS: BP- 152/87 CHEST: Lungs clear to auscultation bilaterally. CARDIOVASCULAR: Heart regular rate and rhythm.       Results for orders placed or performed in visit on 05/05/24  [facility] MAMMOGRAPHY   Collection Time: 05/05/24  3:20 PM  Result Value Ref Range   HM Mammogram 0-4 Bi-Rad 0-4 Bi-Rad, Self Reported Normal    Assessment & Plan:   Problem List Items Addressed This Visit       Endocrine   Type 2 diabetes mellitus with other specified complication (HCC)   Relevant Orders   Ambulatory referral to Ophthalmology   CBC With Diff/Platelet   CMP14+EGFR   CoaguChek XS/INR Waived   Bayer DCA Hb A1c Waived   Other Visit Diagnoses       Preoperative clearance    -  Primary   Relevant Orders   EKG 12-Lead (Completed)   CBC With Diff/Platelet   CMP14+EGFR   CoaguChek XS/INR Waived   Bayer DCA Hb A1c Waived           Preoperative clearance for knee replacement Scheduled for knee replacement due to issues with previous half knee replacement. EKG normal sinus rhythm with left axis deviation, likely due to hypertension. - Ordered blood work for preoperative clearance. - Ensure no NSAIDs before surgery. - Stop multivitamins before surgery. - Rechecked blood pressure before leaving the clinic.  Hypertension Blood pressure elevated at 152/87 mmHg, possibly due to stress from upcoming surgery. No blood thinners used. - Rechecked blood  pressure before leaving the clinic. - Ensure blood pressure is controlled before surgery.  Hypothyroidism Managed with thyroid  medication, no issues reported. - Continue current thyroid  medication regimen.     Recheck 144/80, cleared for surgery as long as blood work looks good.     Follow up plan: Return if symptoms worsen or fail to improve.  Counseling provided for all of the vaccine components Orders Placed This Encounter  Procedures   CBC With Diff/Platelet   CMP14+EGFR   CoaguChek XS/INR Waived   Bayer DCA Hb A1c Waived   Ambulatory referral to Ophthalmology   EKG 12-Lead    Fonda Levins, MD [facility] 07/14/2024, 9:34 AM     "

## 2024-07-15 LAB — CBC WITH DIFF/PLATELET
Basophils Absolute: 0.1 x10E3/uL (ref 0.0–0.2)
Basos: 1 %
EOS (ABSOLUTE): 0.2 x10E3/uL (ref 0.0–0.4)
Eos: 3 %
Hematocrit: 46.5 % (ref 34.0–46.6)
Hemoglobin: 15.2 g/dL (ref 11.1–15.9)
Immature Grans (Abs): 0.2 x10E3/uL — ABNORMAL HIGH (ref 0.0–0.1)
Immature Granulocytes: 2 %
Lymphocytes Absolute: 1.6 x10E3/uL (ref 0.7–3.1)
Lymphs: 17 %
MCH: 31.4 pg (ref 26.6–33.0)
MCHC: 32.7 g/dL (ref 31.5–35.7)
MCV: 96 fL (ref 79–97)
Monocytes Absolute: 0.8 x10E3/uL (ref 0.1–0.9)
Monocytes: 8 %
Neutrophils Absolute: 6.6 x10E3/uL (ref 1.4–7.0)
Neutrophils: 69 %
Platelets: 285 x10E3/uL (ref 150–450)
RBC: 4.84 x10E6/uL (ref 3.77–5.28)
RDW: 12.7 % (ref 11.7–15.4)
WBC: 9.4 x10E3/uL (ref 3.4–10.8)

## 2024-07-15 LAB — CMP14+EGFR
ALT: 35 IU/L — ABNORMAL HIGH (ref 0–32)
AST: 32 IU/L (ref 0–40)
Albumin: 4.5 g/dL (ref 3.8–4.8)
Alkaline Phosphatase: 91 IU/L (ref 49–135)
BUN/Creatinine Ratio: 15 (ref 12–28)
BUN: 14 mg/dL (ref 8–27)
Bilirubin Total: 0.3 mg/dL (ref 0.0–1.2)
CO2: 23 mmol/L (ref 20–29)
Calcium: 10.7 mg/dL — ABNORMAL HIGH (ref 8.7–10.3)
Chloride: 99 mmol/L (ref 96–106)
Creatinine, Ser: 0.91 mg/dL (ref 0.57–1.00)
Globulin, Total: 2.6 g/dL (ref 1.5–4.5)
Glucose: 109 mg/dL — ABNORMAL HIGH (ref 70–99)
Potassium: 4.5 mmol/L (ref 3.5–5.2)
Sodium: 140 mmol/L (ref 134–144)
Total Protein: 7.1 g/dL (ref 6.0–8.5)
eGFR: 67 mL/min/1.73

## 2024-07-20 ENCOUNTER — Ambulatory Visit: Payer: Self-pay | Admitting: Family Medicine

## 2024-07-20 ENCOUNTER — Telehealth: Payer: Self-pay

## 2024-07-20 NOTE — Telephone Encounter (Signed)
 Surgical clearance form, last OV notes, recent labs and EKG faxed to [facility] st [phone]

## 2024-07-28 NOTE — H&P (Incomplete)
 KNEE ARTHROPLASTY ADMISSION H&P  Patient ID: Laurie Mckenzie MRN: [id number] DOB/AGE: 74-May-1952 74 y.o.  Chief Complaint: {Left/right:3049041} knee pain.  Planned Procedure Date: *** Medical Clearance by ***   Cardiac Clearance by *** Additional clearance by ***   HPI: Laurie Mckenzie is a 74 y.o. female who presents for evaluation of FAILED LEFT TOTAL KNEE PROSTHESIS. The patient has a history of pain and functional disability in the {Left/right:3049041} knee due to arthritis and has failed non-surgical conservative treatments for greater than 12 weeks to include {nonsurgical conservative treatment (must select two):3049030}.  Onset of symptoms was {abrupt, gradual:20671}, starting {1->10 years:3049031} years ago with {stable, gradual worsening, rapidly worsening:3049032} course since that time. The patient noted {no past surgery, prior procedures:3049033} on the {Left/right:3049041} knee.  Patient currently rates pain at {1-10:3049035} out of 10 with activity. Patient has {night pain, worsening of pain with activity and weight bearing:3049036}.  Patient has evidence of {Radiographic or MRI evidence of (must document one of the below):3046104} by imaging studies.  There is no active infection.  Past Medical History:  Diagnosis Date   Cancer [facility])    Headache    Hypertension    186/106 today    195/108 07/27/2017   Hypothyroidism    Primary osteoarthritis of knee    Right   Past Surgical History:  Procedure Laterality Date   BREAST LUMPECTOMY WITH RADIOACTIVE SEED AND SENTINEL LYMPH NODE BIOPSY Left 03/27/2021   Procedure: LEFT BREAST LUMPECTOMY WITH RADIOACTIVE SEED AND SENTINEL LYMPH NODE BIOPSY;  Surgeon: Vanderbilt Ned, MD;  Location: [facility];  Service: General;  Laterality: Left;   BREAST LUMPECTOMY WITH RADIOACTIVE SEED LOCALIZATION Left 05/05/2023   Procedure: RADIOACTIVE SEED GUIDED LEFT BREAST LUMPECTOMY;  Surgeon: Vanderbilt Ned, MD;  Location: [facility];  Service: General;  Laterality: Left;   COLONOSCOPY N/A 01/16/2015   Procedure: COLONOSCOPY;  Surgeon: Lamar CHRISTELLA Hollingshead, MD;  [facility];  Service: Endoscopy;  Laterality: N/A;  8:30 AM   KNEE ARTHROPLASTY Left 2011   [place]   TOTAL KNEE ARTHROPLASTY Right 08/23/2017   Procedure: TOTAL KNEE ARTHROPLASTY;  Surgeon: Jane Lamar, MD;  Location[facility];  Service: Orthopedics;  Laterality: Right;   Allergies[1] Prior to Admission medications  Medication Sig Start Date End Date Taking? Authorizing Provider  acetaminophen  (TYLENOL ) 500 MG tablet Take 1,000-1,500 mg by mouth every 6 (six) hours as needed for moderate pain or headache.     [provider]  amLODipine  (NORVASC ) 5 MG tablet Take 1 tablet (5 mg total) by mouth daily. 08/05/23   Dettinger, Fonda LABOR, MD  anastrozole  (ARIMIDEX ) 1 MG tablet TAKE ONE TABLET ONCE DAILY 06/15/24   Iruku, Praveena, MD  Ascorbic Acid (VITAMIN C) 100 MG tablet Take 100 mg by mouth daily.    [provider]  atorvastatin  (LIPITOR) 20 MG tablet Take 1 tablet (20 mg total) by mouth daily. 08/05/23   Dettinger, Fonda LABOR, MD  azelastine  (ASTELIN ) 0.1 % nasal spray Place 1 spray into both nostrils 2 (two) times daily. Use in each nostril as directed 07/04/24   Stuart Vernell Norris, PA-C  calcium  carbonate (OSCAL) 1500 (600 Ca) MG TABS tablet Take 600 mg by mouth 2 (two) times daily with a meal.    [provider]  cetirizine  (ZYRTEC ) 10 MG tablet Take 1 tablet (10 mg total) by mouth daily. 05/21/23   Leath-Warren, Etta PARAS, NP  Cholecalciferol (VITAMIN D3 PO) Take 1 capsule by mouth daily.  [provider]  diphenhydrAMINE  (BENADRYL ) 25 MG tablet Take 25 mg by mouth once.    [provider]  EPINEPHRINE  0.3 mg/0.3 mL IJ SOAJ injection Inject 0.3 mLs (0.3 mgttotal) into the muscle for 1 dose. 03/29/23   Dettinger, Fonda LABOR, MD  fenofibrate  (TRICOR ) 48 MG tablet TAKE ONE TABLET DAILY 04/05/24   Dettinger,  Fonda LABOR, MD  ibuprofen  (ADVIL ) 800 MG tablet Take 1 tablet (800 mg total) by mouth every 8 (eight) hours as needed. 03/27/21   Cornett, Debby, MD  levocetirizine (XYZAL ) 5 MG tablet Take 1 tablet (5 mg total) by mouth every evening. 07/04/24   Stuart Vernell Norris, PA-C  levothyroxine  (SYNTHROID ) 150 MCG tablet Take 1 tablet (150 mcg total) by mouth daily. 08/05/23   Dettinger, Fonda LABOR, MD  montelukast  (SINGULAIR ) 10 MG tablet TAKE ONE TABLET AT BEDTIME 03/21/24   Dettinger, Fonda LABOR, MD  Multiple Vitamin (MULTIVITAMIN) tablet Take 1 tablet by mouth daily.    [provider]  triamcinolone  (NASACORT ) 55 MCG/ACT AERO nasal inhaler Place 2 sprays into the nose 2 (two) times daily.    [provider]  zinc gluconate 50 MG tablet Take 50 mg by mouth daily.    [provider]   Social History   Socioeconomic History   Marital status: Single    Spouse name: Not on file   Number of children: 0   Years of education: Not on file   Highest education level: Not on file  Occupational History   Occupation: retired    Comment: 2017  Tobacco Use   Smoking status: Never   Smokeless tobacco: Never  Vaping Use   Vaping status: Never Used  Substance and Sexual Activity   Alcohol use: No   Drug use: No   Sexual activity: Not Currently    Comment: not in relationship currently  Other Topics Concern   Not on file  Social History Narrative   Not on file   Social Drivers of Health   Tobacco Use: Low Risk (07/14/2024)   Patient History    Smoking Tobacco Use: Never    Smokeless Tobacco Use: Never    Passive Exposure: Not on file  Financial Resource Strain: Low Risk (12/13/2023)   Overall Financial Resource Strain (CARDIA)    Difficulty of Paying Living Expenses: Not hard at all  Food Insecurity: No Food Insecurity (12/13/2023)   Epic    Worried About Programme Researcher, Broadcasting/film/video in the Last Year: Never true    Ran Out of Food in the Last Year: Never true  Transportation Needs:  No Transportation Needs (12/13/2023)   Epic    Lack of Transportation (Medical): No    Lack of Transportation (Non-Medical): No  Physical Activity: Insufficiently Active (12/13/2023)   Exercise Vital Sign    Days of Exercise per Week: 1 day    Minutes of Exercise per Session: 20 min  Stress: No Stress Concern Present (12/13/2023)   [employer] of Occupational Health - Occupational Stress Questionnaire    Feeling of Stress: Not at all  Social Connections: Moderately Isolated (12/13/2023)   Social Connection and Isolation Panel    Frequency of Communication with Friends and Family: More than three times a week    Frequency of Social Gatherings with Friends and Family: More than three times a week    Attends Religious Services: More than 4 times per year    Active Member of [employer] or Organizations: No    Attends Banker  Meetings: Never    Marital Status: Never married  Depression (PHQ2-9): Low Risk (07/14/2024)   Depression (PHQ2-9)    PHQ-2 Score: 0  Alcohol Screen: Low Risk (12/13/2023)   Alcohol Screen    Last Alcohol Screening Score (AUDIT): 0  Housing: Unknown (06/05/2024)   Received from [facility]   Epic    Unable to Pay for Housing in the Last Year: Not on file    Number of Times Moved in the Last Year: Not on file    At any time in the past 12 months, were you homeless or living in a shelter (including now)?: No  Utilities: Not At Risk (12/13/2023)   Epic    Threatened with loss of utilities: No  Health Literacy: Adequate Health Literacy (12/13/2023)   B1300 Health Literacy    Frequency of need for help with medical instructions: Never   Family History  Problem Relation Age of Onset   Hypertension Mother    Lung disease Father     ROS: Currently denies lightheadedness, dizziness, Fever, chills, CP, SOB.***   No personal history of DVT, PE, MI, or CVA.*** No loose teeth or dentures*** All other systems have been reviewed and were  otherwise currently negative with the exception of those mentioned in the HPI and as above.  Objective: Vitals: Ht: *** Wt: *** lbs Temp: *** BP: *** Pulse: *** O2 ***% on room air.   Physical Exam: General: Alert, NAD.  Antalgic Gait *** HEENT: EOMI, Good Neck Extension *** Pulm: No increased work of breathing.  Clear B/L A/P w/o crackle or wheeze. *** CV: RRR, No m/g/r appreciated *** GI: soft, NT, ND. BS x 4 quadrants Neuro: CN II-XII grossly intact without focal deficit.  Sensation intact distally Skin: No lesions in the area of chief complaint MSK/Surgical Site:  *** JLT. ROM ***.  5/5 strength in extension and flexion.  +EHL/FHL.  NVI.  Stable varus and valgus stress.    Imaging Review Plain radiographs demonstrate {mild/mod/severe:3049053} degenerative joint disease of the {left/right/bi:30031} knee.   The overall alignment is{Neutral/varus:3049054}. The bone quality appears to be {good/fair/poor/excellent:33178} for age and reported activity level.  Preoperative templating of the joint replacement has been completed, documented, and submitted to the Operating Room personnel in order to optimize intra-operative equipment management.  Assessment: FAILED LEFT TOTAL KNEE PROSTHESIS Active Problems:   * No active hospital problems. *   Plan: Plan for Procedures: REVISION, TOTAL ARTHROPLASTY, KNEE  The patient history, physical exam, clinical judgement of the provider and imaging are consistent with end stage degenerative joint disease and *** joint arthroplasty is deemed medically necessary. The treatment options including medical management, injection therapy, and arthroplasty were discussed at length. The risks and benefits of Procedures: REVISION, TOTAL ARTHROPLASTY, KNEE were presented and reviewed.  The risks of nonoperative treatment, versus surgical intervention including but not limited to continued pain, aseptic loosening, stiffness, dislocation/subluxation, infection,  bleeding, nerve injury, blood clots, cardiopulmonary complications, morbidity, mortality, among others were discussed. The patient verbalizes understanding and wishes to proceed with the plan.  Patient is being admitted for inpatient treatment for surgery, pain control, PT, prophylactic antibiotics, VTE prophylaxis, progressive ambulation, ADL's and discharge planning.   Dental prophylaxis discussed and recommended for 2 years postoperatively.***  The patient does not*** meet the criteria for TXA which will be used perioperatively.   ASA 81 mg BID ***ASA 325 mg ***Xarelto  will be used postoperatively for DVT prophylaxis in addition to SCDs, and early ambulation. Plan  for ***Tylenol , Celebrex, oxycodone  for pain.   Robaxin***Baclofen for muscle spasms.   Zofran  for nausea and vomiting. *** Colace Miralax  Senokot is for constipation prevention. Pharmacy- *** The patient is planning to be discharged home with OPPT*** HHPT*** and into the care of *** who can be reached at *** Follow up appt ***  {ORTHOADMISSIONSTATUS:21269}    Deshonda Cryderman M Davian Wollenberg, PA-C Office [phone] 07/28/2024 5:11 PM       [1]  Allergies Allergen Reactions   Sulfa Antibiotics Other (See Comments)   Bee Venom Swelling and Other (See Comments)    Red and hot to site   Fish Allergy Hives   Ivp Dye [Iodinated Contrast Media] Other (See Comments)    MD advised not to use, due to fish allergies

## 2024-08-07 ENCOUNTER — Encounter (HOSPITAL_COMMUNITY): Admission: RE | Admit: 2024-08-07

## 2024-08-07 ENCOUNTER — Ambulatory Visit: Payer: Self-pay | Admitting: Family Medicine

## 2024-08-15 ENCOUNTER — Encounter: Admission: RE | Payer: Self-pay | Source: Ambulatory Visit

## 2024-08-15 ENCOUNTER — Ambulatory Visit (HOSPITAL_COMMUNITY): Admission: RE | Admit: 2024-08-15 | Source: Ambulatory Visit | Admitting: Orthopedic Surgery

## 2024-08-18 ENCOUNTER — Ambulatory Visit: Admitting: Physical Therapy

## 2024-08-28 ENCOUNTER — Ambulatory Visit: Admitting: Hematology and Oncology

## 2024-08-28 ENCOUNTER — Other Ambulatory Visit

## 2024-08-31 ENCOUNTER — Inpatient Hospital Stay

## 2024-08-31 ENCOUNTER — Inpatient Hospital Stay: Admitting: Hematology and Oncology

## 2024-11-29 ENCOUNTER — Encounter (INDEPENDENT_AMBULATORY_CARE_PROVIDER_SITE_OTHER): Admitting: Ophthalmology

## 2024-12-13 ENCOUNTER — Ambulatory Visit: Payer: Self-pay

## 2024-12-26 ENCOUNTER — Ambulatory Visit

## 2025-01-08 ENCOUNTER — Ambulatory Visit
